# Patient Record
Sex: Female | Born: 1995 | Hispanic: No | Marital: Single | Smoking: Former smoker
Health system: Southern US, Community
[De-identification: ages and names within clinical notes are randomized; demographics above are authoritative.]

## PROBLEM LIST (undated history)

## (undated) ENCOUNTER — Inpatient Hospital Stay (HOSPITAL_COMMUNITY): Payer: Self-pay

## (undated) DIAGNOSIS — R112 Nausea with vomiting, unspecified: Secondary | ICD-10-CM

## (undated) DIAGNOSIS — M1990 Unspecified osteoarthritis, unspecified site: Secondary | ICD-10-CM

## (undated) DIAGNOSIS — Z9889 Other specified postprocedural states: Secondary | ICD-10-CM

## (undated) DIAGNOSIS — S143XXA Injury of brachial plexus, initial encounter: Secondary | ICD-10-CM

## (undated) DIAGNOSIS — M419 Scoliosis, unspecified: Secondary | ICD-10-CM

## (undated) DIAGNOSIS — J45909 Unspecified asthma, uncomplicated: Secondary | ICD-10-CM

## (undated) HISTORY — DX: Unspecified osteoarthritis, unspecified site: M19.90

## (undated) HISTORY — DX: Injury of brachial plexus, initial encounter: S14.3XXA

## (undated) HISTORY — PX: NECK SURGERY: SHX720

## (undated) HISTORY — PX: LEG SURGERY: SHX1003

## (undated) HISTORY — PX: WISDOM TOOTH EXTRACTION: SHX21

## (undated) HISTORY — PX: SPINE SURGERY: SHX786

## (undated) HISTORY — PX: OTHER SURGICAL HISTORY: SHX169

---

## 2015-09-04 ENCOUNTER — Emergency Department (HOSPITAL_COMMUNITY): Payer: No Typology Code available for payment source

## 2015-09-04 ENCOUNTER — Encounter (HOSPITAL_COMMUNITY): Payer: Self-pay | Admitting: *Deleted

## 2015-09-04 ENCOUNTER — Emergency Department (HOSPITAL_COMMUNITY)
Admission: EM | Admit: 2015-09-04 | Discharge: 2015-09-04 | Disposition: A | Discharge status: Home / Self Care | Payer: No Typology Code available for payment source | Attending: Emergency Medicine | Admitting: Emergency Medicine

## 2015-09-04 DIAGNOSIS — S40012A Contusion of left shoulder, initial encounter: Secondary | ICD-10-CM | POA: Diagnosis not present

## 2015-09-04 DIAGNOSIS — Y92410 Unspecified street and highway as the place of occurrence of the external cause: Secondary | ICD-10-CM | POA: Diagnosis not present

## 2015-09-04 DIAGNOSIS — J45909 Unspecified asthma, uncomplicated: Secondary | ICD-10-CM | POA: Diagnosis not present

## 2015-09-04 DIAGNOSIS — Y998 Other external cause status: Secondary | ICD-10-CM | POA: Insufficient documentation

## 2015-09-04 DIAGNOSIS — S161XXA Strain of muscle, fascia and tendon at neck level, initial encounter: Secondary | ICD-10-CM | POA: Diagnosis not present

## 2015-09-04 DIAGNOSIS — S0990XA Unspecified injury of head, initial encounter: Secondary | ICD-10-CM | POA: Insufficient documentation

## 2015-09-04 DIAGNOSIS — S199XXA Unspecified injury of neck, initial encounter: Secondary | ICD-10-CM | POA: Diagnosis present

## 2015-09-04 DIAGNOSIS — Y9389 Activity, other specified: Secondary | ICD-10-CM | POA: Diagnosis not present

## 2015-09-04 DIAGNOSIS — S3992XA Unspecified injury of lower back, initial encounter: Secondary | ICD-10-CM | POA: Diagnosis not present

## 2015-09-04 DIAGNOSIS — Z8739 Personal history of other diseases of the musculoskeletal system and connective tissue: Secondary | ICD-10-CM | POA: Insufficient documentation

## 2015-09-04 HISTORY — DX: Unspecified asthma, uncomplicated: J45.909

## 2015-09-04 HISTORY — DX: Scoliosis, unspecified: M41.9

## 2015-09-04 MED ORDER — METHOCARBAMOL 500 MG PO TABS: 500.0000 mg | ORAL_TABLET | Freq: Two times a day (BID) | ORAL | Status: DC

## 2015-09-04 MED ORDER — IBUPROFEN 400 MG PO TABS
400.0000 mg | ORAL_TABLET | Freq: Once | ORAL | Status: AC
  Administered 2015-09-04: 400 mg via ORAL
  Filled 2015-09-04: qty 1

## 2015-09-04 MED ORDER — NAPROXEN 375 MG PO TABS: 375.0000 mg | ORAL_TABLET | Freq: Two times a day (BID) | ORAL | Status: DC

## 2015-09-04 NOTE — Discharge Instructions (Signed)
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
## 2015-09-04 NOTE — ED Notes (Signed)
Patient verbalized understanding of discharge instructions and denies any further needs or questions at this time. VS stable. Patient ambulatory with steady gait.  
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
## 2016-03-28 ENCOUNTER — Encounter (HOSPITAL_COMMUNITY): Payer: Self-pay | Admitting: *Deleted

## 2016-03-28 ENCOUNTER — Emergency Department (HOSPITAL_COMMUNITY): Payer: Self-pay

## 2016-03-28 ENCOUNTER — Emergency Department (HOSPITAL_COMMUNITY)
Admission: EM | Admit: 2016-03-28 | Discharge: 2016-03-28 | Disposition: A | Discharge status: Home / Self Care | Payer: Self-pay | Attending: Emergency Medicine | Admitting: Emergency Medicine

## 2016-03-28 DIAGNOSIS — M25562 Pain in left knee: Secondary | ICD-10-CM | POA: Insufficient documentation

## 2016-03-28 DIAGNOSIS — Y929 Unspecified place or not applicable: Secondary | ICD-10-CM | POA: Insufficient documentation

## 2016-03-28 DIAGNOSIS — F17200 Nicotine dependence, unspecified, uncomplicated: Secondary | ICD-10-CM | POA: Insufficient documentation

## 2016-03-28 DIAGNOSIS — Y9389 Activity, other specified: Secondary | ICD-10-CM | POA: Insufficient documentation

## 2016-03-28 DIAGNOSIS — J45909 Unspecified asthma, uncomplicated: Secondary | ICD-10-CM | POA: Insufficient documentation

## 2016-03-28 DIAGNOSIS — Y999 Unspecified external cause status: Secondary | ICD-10-CM | POA: Insufficient documentation

## 2016-03-28 DIAGNOSIS — W2203XA Walked into furniture, initial encounter: Secondary | ICD-10-CM | POA: Insufficient documentation

## 2016-03-28 MED ORDER — NAPROXEN 500 MG PO TABS: 500.0000 mg | ORAL_TABLET | Freq: Two times a day (BID) | ORAL | 0 refills | Status: DC

## 2016-03-28 MED ORDER — HYDROCODONE-ACETAMINOPHEN 5-325 MG PO TABS: 1.0000 | ORAL_TABLET | ORAL | 0 refills | Status: DC | PRN

## 2016-03-28 MED ORDER — HYDROCODONE-ACETAMINOPHEN 5-325 MG PO TABS
1.0000 | ORAL_TABLET | Freq: Once | ORAL | Status: AC
  Administered 2016-03-28: 1 via ORAL
  Filled 2016-03-28: qty 1

## 2016-03-28 NOTE — Progress Notes (Signed)
Orthopedic Tech Progress Note Patient Details:  Linda Berry 07/10/1995 [id number]  Ortho Devices Type of Ortho Device: Crutches Ortho Device/Splint Interventions: Ordered, Adjustment   Jennye MoccasinHughes, Shaneese Tait Craig 03/28/2016, 5:54 PM

## 2016-03-28 NOTE — ED Provider Notes (Signed)
[facility]-EMERGENCY DEPT Provider Note   CSN: [id number] Arrival date & time: 03/28/16  1647  By signing my name below, I, Linda Berry, attest that this documentation has been prepared under the direction and in the presence of Rayhan Groleau, [place]. Electronically Signed: Valentino SaxonBianca Berry, ED Scribe. 03/28/16. 5:12 PM.  Chief Complaint Chief Complaint  Patient presents with  . Knee Pain   The history is provided by the patient. No language interpreter was used.   HPI Comments: Linda Berry is a 20 y.o. female who presents to the Emergency Department complaining of moderate, constant, left knee pain s/p injury that occurred last night. Pt reports she was playing drinking games with her friends and states she got up to use the restroom but striked her knee on a sharp corner table nearby. She notes the sudden pain was unbearable and caused her to fall and hit her knee again on the side. She denies any additional injuries. Pt states she was able to ambulate with difficulty afterwards. No modifying factors noted. Pt notes ice-pack given in ED has had significant relief with swelling and pain. Pt notes she is unable to ambulate at this time due to pain. She also notes tenderness to the affected area. She denies fever. Denies hitting her head or LOC.  Past Medical History:  Diagnosis Date  . Asthma   . Scoliosis     There are no active problems to display for this patient.   Past Surgical History:  Procedure Laterality Date  . arm surgery     brachial plexus    OB History    No data available       Home Medications    Prior to Admission medications   Medication Sig Start Date End Date Taking? Authorizing Provider  methocarbamol (ROBAXIN) 500 MG tablet Take 1 tablet (500 mg total) by mouth 2 (two) times daily. 09/04/15   Hope Orlene OchM Neese, NP  naproxen (NAPROSYN) 375 MG tablet Take 1 tablet (375 mg total) by mouth 2 (two) times daily. 09/04/15   Hope Orlene OchM Neese, NP    Family History No  family history on file.  Social History Social History  Substance Use Topics  . Smoking status: Current Some Day Smoker  . Smokeless tobacco: Never Used  . Alcohol use No     Allergies   Patient has no known allergies.   Review of Systems Review of Systems  Constitutional: Negative for fever.  Musculoskeletal: Positive for arthralgias (left knee) and joint swelling.  All other systems reviewed and are negative.    Physical Exam Updated Vital Signs BP 136/80 (BP Location: Right Arm)   Pulse 90   Temp 98.7 F (37.1 C) (Oral)   Resp 16   Ht 4' 11.5" (1.511 m)   Wt 140 lb (63.5 kg)   LMP 03/10/2016   SpO2 100%   BMI 27.80 kg/m   Physical Exam  Constitutional: She appears well-developed and well-nourished.  HENT:  Head: Normocephalic and atraumatic.  Eyes: Conjunctivae are normal. Right eye exhibits no discharge. Left eye exhibits no discharge.  Pulmonary/Chest: Effort normal. No respiratory distress.  Musculoskeletal:  Left knee diffusely edematous especially suprapatellar. Diffuse TTP esp anterior aspect. Limited active ROM 2/2 pain. Full passive ROM. +pain with valgus and varus stress. Negative anterior drawer test. 2+ DP and PT Sensation intact throughout  Neurological: She is alert. Coordination normal.  Skin: Skin is warm and dry. No rash noted. She is not diaphoretic. No erythema.  Psychiatric: She has  a normal mood and affect.  Nursing note and vitals reviewed.    ED Treatments / Results   DIAGNOSTIC STUDIES: Oxygen Saturation is 100% on RA, normal by my interpretation.    COORDINATION OF CARE: 5:05 PM Discussed treatment plan with pt at bedside which includes a knee XR and f/u with knee orthopedist and pt agreed to plan.   Labs (all labs ordered are listed, but only abnormal results are displayed) Labs Reviewed - No data to display  EKG  EKG Interpretation None       Radiology Dg Knee Complete 4 Views Left  Result Date:  03/28/2016 CLINICAL DATA:  Fall.  Initial evaluation . EXAM: LEFT KNEE - COMPLETE 4+ VIEW COMPARISON:  No recent prior. FINDINGS: Prominent knee joint effusion. No acute bony or joint abnormality identified. IMPRESSION: Prominent knee joint effusion.  No acute abnormality. Electronically Signed   By: Maisie Fushomas  Register   On: 03/28/2016 17:32    Procedures Procedures (including critical care time)  Medications Ordered in ED Medications  HYDROcodone-acetaminophen (NORCO/VICODIN) 5-325 MG per tablet 1 tablet (not administered)     Initial Impression / Assessment and Plan / ED Course  I have reviewed the triage vital signs and the nursing notes.  Pertinent labs & imaging results that were available during my care of the patient were reviewed by me and considered in my medical decision making (see chart for details).  Clinical Course    X-ray with effusion but no acute bony abnormality. Discussed possibility of strain or other ligamentous/soft tissue injury vs contusion. Will provide knee sleeve and crutches. RICE therapy encouraged. Instructed ortho f/u. ER return precautions given.  Final Clinical Impressions(s) / ED Diagnoses   Final diagnoses:  Acute pain of left knee    New Prescriptions Discharge Medication List as of 03/28/2016  5:46 PM    START taking these medications   Details  HYDROcodone-acetaminophen (NORCO/VICODIN) 5-325 MG tablet Take 1 tablet by mouth every 4 (four) hours as needed., Starting Thu 03/28/2016, Print    !! naproxen (NAPROSYN) 500 MG tablet Take 1 tablet (500 mg total) by mouth 2 (two) times daily., Starting Thu 03/28/2016, Print     !! - Potential duplicate medications found. Please discuss with provider.      I personally performed the services described in this documentation, which was scribed in my presence. The recorded information has been reviewed and is accurate.    Carlene CoriaSerena Y Modupe Shampine, PA-C 03/28/16 2033    Lorre NickAnthony Allen, MD 03/29/16 [phone]

## 2016-03-28 NOTE — ED Triage Notes (Signed)
Pt states was playing drinking uno last night and ran into a bedside table. Her L knee hit the corner and then she landed on L knee on floor. Was initially able to ambulate, but now pain to great.  Swelling noted.

## 2016-03-28 NOTE — Discharge Instructions (Signed)
You were seen in the emergency room today for evaluation of left knee injury. Your x-ray showed some swelling but otherwise negative. As we discussed you might have strained or sprained something. Take medication as prescribed as needed. Ice your knee on and off for the next 24-48 hrs. Keep your leg elevated when resting at home. Follow up with orthopedics next week as needed. Return to the emergency room for new or worsening symptoms.

## 2016-03-28 NOTE — ED Notes (Signed)
Ice pack applied to left knee

## 2018-04-29 NOTE — L&D Delivery Note (Signed)
LABOR COURSE Patient was admitted for IOL for IUGR in the setting of di-di twin gestation. Cytotec was used for cervical ripening as well as a cook catheter. She was augmented with Pitocin. Baby A's placenta was AROM'd at 1100. Baby B's placenta was AROM'd just prior to delivery.  Delivery Note Called to room and patient was complete, pushing well throughout second stage.    Baby A delivered MOA. No nuchal cord present. Shoulder and body delivered in usual fashion. At 1720 a viable female was delivered via Vaginal, Spontaneous (Presentation:MOA, no restitution).  Infant with spontaneous cry, placed on mother's abdomen, dried and stimulated. Cord clamped x 2 after one-minute delay, and cut by FOB. Cord blood drawn.   Baby B delivered LOT with restitution to LOA. No nuchal cord present. Shoulder and body delivered in usual fashion. At 1741 a viable female was delivered via Vaginal, Spontaneous (Presentation:LOT, LOA.Infant with spontaneous cry, placed on mother's abdomen, dried and stimulated. Cord clamped x 2 after one-minute delay, and cut by FOB. Cord blood drawn.   Placentas delivered together spontaneously with gentle cord traction. Appear intact. Fundus slowly firm with massage and Pitocin. Labia, perineum, vagina, and cervix inspected with no lacerations noted. Called by RN for brisk bleeding approximately one hour following delivery in setting of firm fundus at 1 below U. 800 mcg Cytotec placed per rectum by RN. Called a second time just prior to patient's move to Postpartum unit for new onset slow trickle of bleeding and passing of one small clot. Small clots evacuated with firm fundal massage. Additional 2-3 large clots evacuated manually. Tolerate very well by patient.  Baby A APGAR:8 , 9; weight: 2340g .   Cord: 3VC with the following complications:None.   Cord pH: Not collected  Baby B APGAR:8 , 9; weight: 2020g .   Cord: 3VC with the following complications:None.   Cord pH: Not  collected  Anesthesia:  Epidural Episiotomy: None Lacerations: None Est. Blood Loss (mL): 1100  Mom to postpartum.  Infants to Couplet care / Skin to Skin. Placentas to Pathology  Follow up Visit:  Please schedule this patient for Postpartum visit in: 4 weeks with the following provider: Any provider For C/S patients schedule nurse incision check in weeks 2 weeks: no High risk pregnancy complicated by: IUGR, PRe-E without severe features Delivery mode: SVD Anticipated Birth Control: Declines PP Procedures needed: BP check  Schedule Integrated Kenneth visit: no  Mallie Snooks, CNM 12/05/18 8:30 PM

## 2018-05-09 ENCOUNTER — Inpatient Hospital Stay (HOSPITAL_COMMUNITY)
Admission: AD | Admit: 2018-05-09 | Discharge: 2018-05-09 | Disposition: A | Discharge status: Home / Self Care | Payer: Medicaid Other | Attending: Family Medicine | Admitting: Family Medicine

## 2018-05-09 ENCOUNTER — Encounter (HOSPITAL_COMMUNITY): Payer: Self-pay | Admitting: *Deleted

## 2018-05-09 ENCOUNTER — Inpatient Hospital Stay (HOSPITAL_COMMUNITY): Payer: Medicaid Other

## 2018-05-09 ENCOUNTER — Other Ambulatory Visit: Payer: Self-pay

## 2018-05-09 DIAGNOSIS — R109 Unspecified abdominal pain: Secondary | ICD-10-CM

## 2018-05-09 DIAGNOSIS — O99331 Smoking (tobacco) complicating pregnancy, first trimester: Secondary | ICD-10-CM | POA: Insufficient documentation

## 2018-05-09 DIAGNOSIS — O30041 Twin pregnancy, dichorionic/diamniotic, first trimester: Secondary | ICD-10-CM | POA: Diagnosis not present

## 2018-05-09 DIAGNOSIS — O219 Vomiting of pregnancy, unspecified: Secondary | ICD-10-CM

## 2018-05-09 DIAGNOSIS — O98311 Other infections with a predominantly sexual mode of transmission complicating pregnancy, first trimester: Secondary | ICD-10-CM | POA: Diagnosis not present

## 2018-05-09 DIAGNOSIS — O23591 Infection of other part of genital tract in pregnancy, first trimester: Secondary | ICD-10-CM

## 2018-05-09 DIAGNOSIS — A5901 Trichomonal vulvovaginitis: Secondary | ICD-10-CM | POA: Diagnosis not present

## 2018-05-09 DIAGNOSIS — O210 Mild hyperemesis gravidarum: Secondary | ICD-10-CM | POA: Insufficient documentation

## 2018-05-09 DIAGNOSIS — Z3A01 Less than 8 weeks gestation of pregnancy: Secondary | ICD-10-CM | POA: Diagnosis not present

## 2018-05-09 DIAGNOSIS — O26891 Other specified pregnancy related conditions, first trimester: Secondary | ICD-10-CM | POA: Diagnosis not present

## 2018-05-09 DIAGNOSIS — F17200 Nicotine dependence, unspecified, uncomplicated: Secondary | ICD-10-CM | POA: Diagnosis not present

## 2018-05-09 LAB — URINALYSIS, ROUTINE W REFLEX MICROSCOPIC
BILIRUBIN URINE: NEGATIVE
Glucose, UA: NEGATIVE mg/dL
Hgb urine dipstick: NEGATIVE
KETONES UR: NEGATIVE mg/dL
Leukocytes, UA: NEGATIVE
NITRITE: NEGATIVE
PH: 7 (ref 5.0–8.0)
Protein, ur: NEGATIVE mg/dL
Specific Gravity, Urine: 1.02 (ref 1.005–1.030)

## 2018-05-09 LAB — CBC
HEMATOCRIT: 35.9 % — AB (ref 36.0–46.0)
Hemoglobin: 12.5 g/dL (ref 12.0–15.0)
MCH: 31.8 pg (ref 26.0–34.0)
MCHC: 34.8 g/dL (ref 30.0–36.0)
MCV: 91.3 fL (ref 80.0–100.0)
Platelets: 211 10*3/uL (ref 150–400)
RBC: 3.93 MIL/uL (ref 3.87–5.11)
RDW: 12.6 % (ref 11.5–15.5)
WBC: 12.7 10*3/uL — AB (ref 4.0–10.5)
nRBC: 0 % (ref 0.0–0.2)

## 2018-05-09 LAB — WET PREP, GENITAL
Sperm: NONE SEEN
Yeast Wet Prep HPF POC: NONE SEEN

## 2018-05-09 LAB — ABO/RH: ABO/RH(D): O POS

## 2018-05-09 LAB — HCG, QUANTITATIVE, PREGNANCY: hCG, Beta Chain, Quant, S: 16562 m[IU]/mL — ABNORMAL HIGH (ref <5)

## 2018-05-09 LAB — POCT PREGNANCY, URINE: Preg Test, Ur: POSITIVE — AB

## 2018-05-09 MED ORDER — METRONIDAZOLE 500 MG PO TABS
2000.0000 mg | ORAL_TABLET | Freq: Once | ORAL | Status: AC
  Administered 2018-05-09: 2000 mg via ORAL
  Filled 2018-05-09: qty 4

## 2018-05-09 MED ORDER — PROMETHAZINE HCL 25 MG PO TABS: 25.0000 mg | ORAL_TABLET | Freq: Four times a day (QID) | ORAL | 0 refills | Status: DC | PRN

## 2018-05-09 NOTE — MAU Note (Signed)
Nausea x1wk.  Period late.  +HPT today.  occ period like cramps, very brief.

## 2018-05-09 NOTE — MAU Provider Note (Signed)
Chief Complaint: Nausea and Possible Pregnancy   First Provider Initiated Contact with Patient 05/09/18 1138     SUBJECTIVE HPI: Linda Berry is a 23 y.o. G1P0 at 5332w1d by LMP who presents to MAU reporting abdominal cramping & nausea. Had a positive pregnancy test at home today.  Has been very nauseated for the last week. No vomiting. Hasn't treated symptoms.  Reports intermittent lower abdominal cramping.  Denies dysuria, vaginal bleeding, or vaginal discharge.   Location: lower abdomen Quality: cramping Severity: 4/10 on pain scale Duration: 1 week Timing: intermittent Modifying factors: none Associated signs and symptoms: nausea  Past Medical History:  Diagnosis Date  . Asthma   . Scoliosis    OB History  Gravida Para Term Preterm AB Living  1            SAB TAB Ectopic Multiple Live Births               # Outcome Date GA Lbr Len/2nd Weight Sex Delivery Anes PTL Lv  1 Current            Past Surgical History:  Procedure Laterality Date  . arm surgery     brachial plexus   Social History   Socioeconomic History  . Marital status: Single    Spouse name: Not on file  . Number of children: Not on file  . Years of education: Not on file  . Highest education level: Not on file  Occupational History  . Not on file  Social Needs  . Financial resource strain: Not on file  . Food insecurity:    Worry: Not on file    Inability: Not on file  . Transportation needs:    Medical: Not on file    Non-medical: Not on file  Tobacco Use  . Smoking status: Current Some Day Smoker  . Smokeless tobacco: Never Used  Substance and Sexual Activity  . Alcohol use: No  . Drug use: No  . Sexual activity: Yes    Birth control/protection: None  Lifestyle  . Physical activity:    Days per week: Not on file    Minutes per session: Not on file  . Stress: Not on file  Relationships  . Social connections:    Talks on phone: Not on file    Gets together: Not on file   Attends religious service: Not on file    Active member of club or organization: Not on file    Attends meetings of clubs or organizations: Not on file    Relationship status: Not on file  . Intimate partner violence:    Fear of current or ex partner: Not on file    Emotionally abused: Not on file    Physically abused: Not on file    Forced sexual activity: Not on file  Other Topics Concern  . Not on file  Social History Narrative  . Not on file   History reviewed. No pertinent family history. No current facility-administered medications on file prior to encounter.    Current Outpatient Medications on File Prior to Encounter  Medication Sig Dispense Refill  . Menthol, Topical Analgesic, (BIOFREEZE EX) Apply 1 application topically daily as needed. For back pain     No Known Allergies  I have reviewed patient's Past Medical Hx, Surgical Hx, Family Hx, Social Hx, medications and allergies.   Review of Systems  Constitutional: Negative.   Gastrointestinal: Positive for abdominal pain and nausea. Negative for constipation, diarrhea and vomiting.  Genitourinary: Negative.     OBJECTIVE Patient Vitals for the past 24 hrs:  BP Temp Temp src Pulse Resp SpO2  05/09/18 1428 115/65 - - 66 - -  05/09/18 1139 (!) 112/59 98.5 F (36.9 C) Oral 71 16 100 %   Constitutional: Well-developed, well-nourished female in no acute distress.  Cardiovascular: normal rate & rhythm, no murmur Respiratory: normal rate and effort. Lung sounds clear throughout GI: Abd soft, non-tender, Pos BS x 4. No guarding or rebound tenderness MS: Extremities nontender, no edema, normal ROM Neurologic: Alert and oriented x 4.     LAB RESULTS Results for orders placed or performed during the hospital encounter of 05/09/18 (from the past 24 hour(s))  Urinalysis, Routine w reflex microscopic     Status: None   Collection Time: 05/09/18 11:12 AM  Result Value Ref Range   Color, Urine YELLOW YELLOW   APPearance  CLEAR CLEAR   Specific Gravity, Urine 1.020 1.005 - 1.030   pH 7.0 5.0 - 8.0   Glucose, UA NEGATIVE NEGATIVE mg/dL   Hgb urine dipstick NEGATIVE NEGATIVE   Bilirubin Urine NEGATIVE NEGATIVE   Ketones, ur NEGATIVE NEGATIVE mg/dL   Protein, ur NEGATIVE NEGATIVE mg/dL   Nitrite NEGATIVE NEGATIVE   Leukocytes, UA NEGATIVE NEGATIVE  Pregnancy, urine POC     Status: Abnormal   Collection Time: 05/09/18 11:13 AM  Result Value Ref Range   Preg Test, Ur POSITIVE (A) NEGATIVE  Wet prep, genital     Status: Abnormal   Collection Time: 05/09/18 11:53 AM  Result Value Ref Range   Yeast Wet Prep HPF POC NONE SEEN NONE SEEN   Trich, Wet Prep PRESENT (A) NONE SEEN   Clue Cells Wet Prep HPF POC PRESENT (A) NONE SEEN   WBC, Wet Prep HPF POC FEW (A) NONE SEEN   Sperm NONE SEEN   CBC     Status: Abnormal   Collection Time: 05/09/18 12:24 PM  Result Value Ref Range   WBC 12.7 (H) 4.0 - 10.5 K/uL   RBC 3.93 3.87 - 5.11 MIL/uL   Hemoglobin 12.5 12.0 - 15.0 g/dL   HCT 16.1 (L) 09.6 - 04.5 %   MCV 91.3 80.0 - 100.0 fL   MCH 31.8 26.0 - 34.0 pg   MCHC 34.8 30.0 - 36.0 g/dL   RDW 40.9 81.1 - 91.4 %   Platelets 211 150 - 400 K/uL   nRBC 0.0 0.0 - 0.2 %  ABO/Rh     Status: None (Preliminary result)   Collection Time: 05/09/18 12:24 PM  Result Value Ref Range   ABO/RH(D)      O POS Performed at [facility], [address] [place], [place] [address]   hCG, quantitative, pregnancy     Status: Abnormal   Collection Time: 05/09/18 12:24 PM  Result Value Ref Range   hCG, Beta Chain, Quant, S 16,562 (H) <5 mIU/mL    IMAGING US Ob Comp Addl Gest Less 14 Wks  Result Date: 05/09/2018 CLINICAL DATA:  Pelvic pain. Gestational age by LMP of 6 weeks 1 day. EXAM: TWIN OBSTETRIC <14WK [place] AND TRANSVAGINAL OB US COMPARISON:  None. FINDINGS: Number of IUPs:  2 Chorionicity/Amnionicity:  Dichorionic-diamniotic (thick membrane) TWIN 1 Yolk sac:  Visualized. Embryo:  Not Visualized. MSD: 7 mm   5 w   2 d TWIN  2 Yolk sac:  Visualized. Embryo:  Not Visualized. MSD: 9 mm   5 w   5 d Subchorionic hemorrhage:  None visualized. Maternal  uterus/adnexae: Normal appearance of both ovaries. No mass or abnormal free fluid identified. IMPRESSION: Early dichorionic twin IUP at approximately 5.5 weeks. Suggest correlation with serial b-hCG levels, and consider followup ultrasound to assess viability in 10 days. No significant maternal uterine or adnexal abnormality identified. Electronically Signed   By: Myles Rosenthal M.D.   On: 05/09/2018 13:53   US Ob Less Than 14 Weeks With Ob Transvaginal  Result Date: 05/09/2018 CLINICAL DATA:  Pelvic pain. Gestational age by LMP of 6 weeks 1 day. EXAM: TWIN OBSTETRIC <14WK [place] AND TRANSVAGINAL OB US COMPARISON:  None. FINDINGS: Number of IUPs:  2 Chorionicity/Amnionicity:  Dichorionic-diamniotic (thick membrane) TWIN 1 Yolk sac:  Visualized. Embryo:  Not Visualized. MSD: 7 mm   5 w   2 d TWIN 2 Yolk sac:  Visualized. Embryo:  Not Visualized. MSD: 9 mm   5 w   5 d Subchorionic hemorrhage:  None visualized. Maternal uterus/adnexae: Normal appearance of both ovaries. No mass or abnormal free fluid identified. IMPRESSION: Early dichorionic twin IUP at approximately 5.5 weeks. Suggest correlation with serial b-hCG levels, and consider followup ultrasound to assess viability in 10 days. No significant maternal uterine or adnexal abnormality identified. Electronically Signed   By: Myles Rosenthal M.D.   On: 05/09/2018 13:53    MAU COURSE Orders Placed This Encounter  Procedures  . Wet prep, genital  . US OB LESS THAN 14 WEEKS WITH OB TRANSVAGINAL  . US OB Comp AddL Gest Less 14 Wks  . Urinalysis, Routine w reflex microscopic  . CBC  . hCG, quantitative, pregnancy  . Pregnancy, urine POC  . ABO/Rh  . Discharge patient   Meds ordered this encounter  Medications  . metroNIDAZOLE (FLAGYL) tablet 2,000 mg  . promethazine (PHENERGAN) 25 MG tablet    Sig: Take 1 tablet (25 mg total) by mouth  every 6 (six) hours as needed for nausea or vomiting.    Dispense:  30 tablet    Refill:  0    Order Specific Question:   Supervising Provider    Answer:   Reva Bores [[id number]]    MDM +UPT UA, wet prep, GC/chlamydia, CBC, ABO/Rh, quant hCG, and [place] today to rule out ectopic pregnancy Wet prep + trich --- flagyl 2 gm PO given in MAU. EPT rx & info given to partner.   Ultrasound shows IUGS & YS x 2  ASSESSMENT 1. Dichorionic diamniotic twin pregnancy in first trimester   2. Abdominal pain during pregnancy in first trimester   3. Trichomonal vaginitis during pregnancy in first trimester   4. Nausea and vomiting during pregnancy prior to [redacted] weeks gestation     PLAN Discharge home in stable condition Start prenatal care Rx promethazine GC/CT pending No intercourse x 2 weeks after trichomonas tx.   Allergies as of 05/09/2018   No Known Allergies     Medication List    STOP taking these medications   HYDROcodone-acetaminophen 5-325 MG tablet Commonly known as:  NORCO/VICODIN   methocarbamol 500 MG tablet Commonly known as:  ROBAXIN   naproxen 375 MG tablet Commonly known as:  NAPROSYN   naproxen 500 MG tablet Commonly known as:  NAPROSYN     TAKE these medications   BIOFREEZE EX Apply 1 application topically daily as needed. For back pain   promethazine 25 MG tablet Commonly known as:  PHENERGAN Take 1 tablet (25 mg total) by mouth every 6 (six) hours as needed for nausea or vomiting.  Judeth HornLawrence, Memory Heinrichs, NP 05/09/2018  6:33 PM

## 2018-05-09 NOTE — Discharge Instructions (Signed)
[facility]   [facility]       Phone: [phone]  [facility] for [facility] Phone: [phone]  [facility] [place]  Phone: [phone]  [facility] for [facility]  Phone: [phone]  [facility]  Phone: [phone]  [facility]       Phone: [phone]  [facility]    Phone: [phone]   [facility]    Phone: [phone]  [facility]    Phone: [phone]  [facility]    Phone: [phone]   [facility]  Phone: [phone]  [facility]    Phone: [phone]  [facility]   Phone: [phone]  [facility]    Phone: [phone]  Multiple Pregnancy Having a multiple pregnancy means that a woman is carrying more than one baby at a time. She may be pregnant with twins, triplets, or more. The majority of multiple pregnancies are twins. Naturally conceiving triplets or more (higher-order multiples) is rare. Multiple pregnancies are riskier than single pregnancies. A woman with a multiple pregnancy is more likely to have certain problems during her pregnancy. Therefore, she will need to have more frequent appointments for prenatal care. How does a multiple pregnancy happen? A multiple pregnancy happens when:  The woman's body releases more than one egg at a time, and then each egg gets fertilized by a different sperm. ? This is the most common type of multiple pregnancy. ? Twins or other multiples produced this way are fraternal. They are no more alike than non-multiple siblings are.  One sperm fertilizes one egg, which then divides into more than one embryo. ? Twins or other multiples produced this way are  identical. Identical multiples are always the same gender, and they look very much alike. Who is most likely to have a multiple pregnancy? A multiple pregnancy is more likely to develop in women who:  Have had fertility treatment, especially if the treatment included fertility drugs.  Are older than 24 years of age.  Have already had four or more children.  Have a family history of multiple pregnancy. How is a multiple pregnancy diagnosed? A multiple pregnancy may be diagnosed based on:  Symptoms such as: ? Rapid weight gain in the first 3 months of pregnancy (first trimester). ? More severe nausea and breast tenderness than what is typical of a single pregnancy. ? The uterus measuring larger than what is normal for the stage of the pregnancy.  Blood tests that detect a higher-than-normal level of human chorionic gonadotropin (hCG). This is a hormone that your body produces in early pregnancy.  Ultrasound exam. This is used to confirm that you are carrying multiples. What risks are associated with multiple pregnancy? A multiple pregnancy puts you at a higher risk for certain problems during or after your pregnancy, including:  Having your babies delivered before you have reached a full-term pregnancy (preterm birth). A full-term pregnancy lasts for at least 37 weeks. Babies born before 37 weeks may have a higher risk of a variety of health problems, such as breathing problems, feeding difficulties, cerebral palsy, and learning disabilities.  Diabetes.  Preeclampsia. This is a serious condition that causes high blood pressure along with other symptoms, such as swelling and headaches, during pregnancy.  Excessive blood loss after childbirth (postpartum hemorrhage).  Postpartum  depression.  Low birth weight of the babies. How will having a multiple pregnancy affect my care? Your health care provider will want to monitor you more closely during your pregnancy to make sure that your  babies are growing normally and that you are healthy. Follow these instructions at home: Because your pregnancy is considered to be high risk, you will need to work closely with your health care team. You may also need to make some lifestyle changes. These may include the following: Eating and drinking  Increase your nutrition. ? Follow your health care providers recommendations for weight gain. You may need to gain a little extra weight when you are pregnant with multiples. ? Eat healthy snacks often throughout the day. This can add calories and reduce nausea.  Drink enough fluid to keep your urine pale yellow.  Take prenatal vitamins. Activity By 20-24 weeks, you may need to limit your activities.  Avoid activities and work that take a lot of effort (are strenuous).  Ask your health care provider when you should stop having sexual intercourse.  Rest often. General instructions  Do not use any products that contain nicotine or tobacco, such as cigarettes and e-cigarettes. If you need help quitting, ask your health care provider.  Do not drink alcohol or use illegal drugs.  Take over-the-counter and prescription medicines only as told by your health care provider.  Arrange for extra help around the house.  Keep all follow-up visits and all prenatal visits as told by your health care provider. This is important. Contact a health care provider if:  You have dizziness.  You have persistent nausea, vomiting, or diarrhea.  You are having trouble gaining weight.  You have feelings of depression or other emotions that are interfering with your normal activities. Get help right away if:  You have a fever.  You have pain with urination.  You have fluid leaking from your vagina.  You have a bad-smelling vaginal discharge.  You notice increased swelling in your face, hands, legs, or ankles.  You have spotting or bleeding from your vagina.  You have pelvic cramps, pelvic  pressure, or nagging pain in your abdomen or lower back.  You are having regular contractions.  You develop a severe headache, with or without visual changes.  You have shortness of breath or chest pain.  You notice less fetal movement, or no fetal movement. Summary  Having a multiple pregnancy means that a woman is carrying more than one baby at a time.  A multiple pregnancy puts you at a higher risk for certain problems during and after your pregnancy, such as: having your babies delivered before you have reached a full-term pregnancy (preterm birth), diabetes, preeclampsia, excessive blood loss after childbirth (postpartum hemorrhage), postpartum depression, or low birth weight of the babies.  Your health care provider will want to monitor you more closely during your pregnancy to make sure that your babies are growing normally and that you are healthy.  You may need to make some lifestyle changes during pregnancy, including: increasing your nutrition, limiting your activities after 20-24 weeks of pregnancy, and arranging for extra help around the house. This information is not intended to replace advice given to you by your health care provider. Make sure you discuss any questions you have with your health care provider. Document Released: 01/23/2008 Document Revised: 01/08/2017 Document Reviewed: 12/15/2015 Elsevier Interactive Patient Education  2019 [employer].  Trichomoniasis Trichomoniasis is an STI (sexually transmitted infection) that can affect both women  and men. In women, the outer area of the female genitalia (vulva) and the vagina are affected. In men, the penis is mainly affected, but the prostate and other reproductive organs can also be involved. This condition can be treated with medicine. It often has no symptoms (is asymptomatic), especially in men. What are the causes? This condition is caused by an organism called Trichomonas vaginalis. Trichomoniasis most often  spreads from person to person (is contagious) through sexual contact. What increases the risk? The following factors may make you more likely to develop this condition:  Having unprotected sexual intercourse.  Having sexual intercourse with a partner who has trichomoniasis.  Having multiple sexual partners.  Having had previous trichomoniasis infections or other STIs. What are the signs or symptoms? In women, symptoms of trichomoniasis include:  Abnormal vaginal discharge that is clear, white, gray, or yellow-green and foamy and has an unusual "fishy" odor.  Itching and irritation of the vagina and vulva.  Burning or pain during urination or sexual intercourse.  Genital redness and swelling. In men, symptoms of trichomoniasis include:  Penile discharge that may be foamy or contain pus.  Pain in the penis. This may happen only when urinating.  Itching or irritation inside the penis.  Burning after urination or ejaculation. How is this diagnosed? In women, this condition may be found during a routine Pap test or physical exam. It may be found in men during a routine physical exam. Your health care provider may perform tests to help diagnose this infection, such as:  Urine tests (men and women).  The following in women: ? Testing the pH of the vagina. ? A vaginal swab test that checks for the Trichomonas vaginalis organism. ? Testing vaginal secretions. Your health care provider may test you for other STIs, including HIV (human immunodeficiency virus). How is this treated? This condition is treated with medicine taken by mouth (orally), such as metronidazole or tinidazole to fight the infection. Your sexual partner(s) may also need to be tested and treated.  If you are a woman and you plan to become pregnant or think you may be pregnant, tell your health care provider right away. Some medicines that are used to treat the infection should not be taken during pregnancy. Your  health care provider may recommend over-the-counter medicines or creams to help relieve itching or irritation. You may be tested for infection again 3 months after treatment. Follow these instructions at home:  Take and use over-the-counter and prescription medicines, including creams, only as told by your health care provider.  Do not have sexual intercourse until one week after you finish your medicine, or until your health care provider approves. Ask your health care provider when you may resume sexual intercourse.  (Women) Do not douche or wear tampons while you have the infection.  Discuss your infection with your sexual partner(s). Make sure that your partner gets tested and treated, if necessary.  Keep all follow-up visits as told by your health care provider. This is important. How is this prevented?  Use condoms every time you have sex. Using condoms correctly and consistently can help protect against STIs.  Avoid having multiple sexual partners.  Talk with your sexual partner about any symptoms that either of you may have, as well as any history of STIs.  Get tested for STIs and STDs (sexually transmitted diseases) before you have sex. Ask your partner to do the same.  Do not have sexual contact if you have symptoms of trichomoniasis or  another STI. Contact a health care provider if:  You still have symptoms after you finish your medicine.  You develop pain in your abdomen.  You have pain when you urinate.  You have bleeding after sexual intercourse.  You develop a rash.  You feel nauseous or you vomit.  You plan to become pregnant or think you may be pregnant. Summary  Trichomoniasis is an STI (sexually transmitted infection) that can affect both women and men.  This condition often has no symptoms (is asymptomatic), especially in men.  You should not have sexual intercourse until one week after you finish your medicine, or until your health care provider  approves. Ask your health care provider when you may resume sexual intercourse.  Discuss your infection with your sexual partner. Make sure that your partner gets tested and treated, if necessary. This information is not intended to replace advice given to you by your health care provider. Make sure you discuss any questions you have with your health care provider. Document Released: 10/09/2000 Document Revised: 03/08/2016 Document Reviewed: 03/08/2016 Elsevier Interactive Patient Education  2019 [employer].

## 2018-05-11 LAB — GC/CHLAMYDIA PROBE AMP (~~LOC~~) NOT AT ARMC
CHLAMYDIA, DNA PROBE: NEGATIVE
NEISSERIA GONORRHEA: NEGATIVE

## 2018-06-23 ENCOUNTER — Encounter: Payer: Self-pay | Admitting: *Deleted

## 2018-06-23 ENCOUNTER — Ambulatory Visit (INDEPENDENT_AMBULATORY_CARE_PROVIDER_SITE_OTHER): Payer: Medicaid Other | Admitting: *Deleted

## 2018-06-23 ENCOUNTER — Other Ambulatory Visit (HOSPITAL_COMMUNITY)
Admission: RE | Admit: 2018-06-23 | Discharge: 2018-06-23 | Disposition: A | Discharge status: Home / Self Care | Payer: Medicaid Other | Source: Ambulatory Visit | Attending: Obstetrics & Gynecology | Admitting: Obstetrics & Gynecology

## 2018-06-23 ENCOUNTER — Other Ambulatory Visit: Payer: Self-pay

## 2018-06-23 VITALS — BP 122/63 | HR 98 | Ht 60.0 in | Wt 140.7 lb

## 2018-06-23 DIAGNOSIS — O99519 Diseases of the respiratory system complicating pregnancy, unspecified trimester: Secondary | ICD-10-CM

## 2018-06-23 DIAGNOSIS — O0990 Supervision of high risk pregnancy, unspecified, unspecified trimester: Secondary | ICD-10-CM

## 2018-06-23 DIAGNOSIS — O0991 Supervision of high risk pregnancy, unspecified, first trimester: Secondary | ICD-10-CM

## 2018-06-23 DIAGNOSIS — Z3A01 Less than 8 weeks gestation of pregnancy: Secondary | ICD-10-CM

## 2018-06-23 DIAGNOSIS — O30049 Twin pregnancy, dichorionic/diamniotic, unspecified trimester: Secondary | ICD-10-CM | POA: Insufficient documentation

## 2018-06-23 DIAGNOSIS — Z3A00 Weeks of gestation of pregnancy not specified: Secondary | ICD-10-CM | POA: Diagnosis not present

## 2018-06-23 DIAGNOSIS — O30041 Twin pregnancy, dichorionic/diamniotic, first trimester: Secondary | ICD-10-CM | POA: Diagnosis not present

## 2018-06-23 DIAGNOSIS — Z113 Encounter for screening for infections with a predominantly sexual mode of transmission: Secondary | ICD-10-CM

## 2018-06-23 DIAGNOSIS — O99511 Diseases of the respiratory system complicating pregnancy, first trimester: Secondary | ICD-10-CM | POA: Diagnosis not present

## 2018-06-23 DIAGNOSIS — J45909 Unspecified asthma, uncomplicated: Secondary | ICD-10-CM

## 2018-06-23 DIAGNOSIS — M419 Scoliosis, unspecified: Secondary | ICD-10-CM | POA: Insufficient documentation

## 2018-06-23 LAB — POCT URINALYSIS DIP (DEVICE)
BILIRUBIN URINE: NEGATIVE
Glucose, UA: NEGATIVE mg/dL
HGB URINE DIPSTICK: NEGATIVE
KETONES UR: NEGATIVE mg/dL
Leukocytes,Ua: NEGATIVE
Nitrite: NEGATIVE
Protein, ur: NEGATIVE mg/dL
Specific Gravity, Urine: 1.02 (ref 1.005–1.030)
Urobilinogen, UA: 0.2 mg/dL (ref 0.0–1.0)
pH: 7 (ref 5.0–8.0)

## 2018-06-23 NOTE — Progress Notes (Signed)
New Ob intake completed and pregnancy information packet given. Labs drawn and Medicaid Home form completed. Informal US performed to verify viability. FHR's obtained per PW doppler. Pt refused flu vaccine. Initial Ob visit scheduled on 07/07/18.

## 2018-06-25 LAB — CULTURE, OB URINE

## 2018-06-25 LAB — GC/CHLAMYDIA PROBE AMP (~~LOC~~) NOT AT ARMC
Chlamydia: NEGATIVE
Neisseria Gonorrhea: NEGATIVE

## 2018-06-25 LAB — URINE CULTURE, OB REFLEX

## 2018-07-01 LAB — OBSTETRIC PANEL, INCLUDING HIV
Antibody Screen: NEGATIVE
Basophils Absolute: 0.1 10*3/uL (ref 0.0–0.2)
Basos: 1 %
EOS (ABSOLUTE): 0.1 10*3/uL (ref 0.0–0.4)
Eos: 0 %
HIV Screen 4th Generation wRfx: NONREACTIVE
Hematocrit: 32.8 % — ABNORMAL LOW (ref 34.0–46.6)
Hemoglobin: 11.5 g/dL (ref 11.1–15.9)
Hepatitis B Surface Ag: NEGATIVE
Immature Grans (Abs): 0.1 10*3/uL (ref 0.0–0.1)
Immature Granulocytes: 1 %
Lymphocytes Absolute: 2.6 10*3/uL (ref 0.7–3.1)
Lymphs: 20 %
MCH: 31.1 pg (ref 26.6–33.0)
MCHC: 35.1 g/dL (ref 31.5–35.7)
MCV: 89 fL (ref 79–97)
MONOCYTES: 7 %
Monocytes Absolute: 0.9 10*3/uL (ref 0.1–0.9)
Neutrophils Absolute: 9.6 10*3/uL — ABNORMAL HIGH (ref 1.4–7.0)
Neutrophils: 71 %
Platelets: 237 10*3/uL (ref 150–450)
RBC: 3.7 x10E6/uL — ABNORMAL LOW (ref 3.77–5.28)
RDW: 12.9 % (ref 11.7–15.4)
RH TYPE: POSITIVE
RPR Ser Ql: NONREACTIVE
Rubella Antibodies, IGG: 12.7 index (ref >0.99)
WBC: 13.3 10*3/uL — ABNORMAL HIGH (ref 3.4–10.8)

## 2018-07-01 LAB — INHERITEST(R) CF/SMA PANEL

## 2018-07-01 LAB — HEMOGLOBINOPATHY EVALUATION
Ferritin: 126 ng/mL (ref 15–150)
HGB F QUANT: 0 % (ref 0.0–2.0)
Hgb A2 Quant: 2.5 % (ref 1.8–3.2)
Hgb A: 97.5 % (ref 96.4–98.8)
Hgb C: 0 %
Hgb S: 0 %
Hgb Solubility: NEGATIVE
Hgb Variant: 0 %

## 2018-07-07 ENCOUNTER — Encounter: Payer: Self-pay | Admitting: Obstetrics & Gynecology

## 2018-07-07 ENCOUNTER — Ambulatory Visit: Payer: Self-pay | Admitting: Clinical

## 2018-07-07 ENCOUNTER — Ambulatory Visit (INDEPENDENT_AMBULATORY_CARE_PROVIDER_SITE_OTHER): Payer: Medicaid Other | Admitting: Obstetrics & Gynecology

## 2018-07-07 ENCOUNTER — Other Ambulatory Visit (HOSPITAL_COMMUNITY)
Admission: RE | Admit: 2018-07-07 | Discharge: 2018-07-07 | Disposition: A | Discharge status: Home / Self Care | Payer: Medicaid Other | Source: Ambulatory Visit | Attending: Obstetrics & Gynecology | Admitting: Obstetrics & Gynecology

## 2018-07-07 VITALS — BP 125/66 | HR 95 | Wt 147.8 lb

## 2018-07-07 DIAGNOSIS — R945 Abnormal results of liver function studies: Secondary | ICD-10-CM

## 2018-07-07 DIAGNOSIS — O0990 Supervision of high risk pregnancy, unspecified, unspecified trimester: Secondary | ICD-10-CM | POA: Diagnosis not present

## 2018-07-07 DIAGNOSIS — O30042 Twin pregnancy, dichorionic/diamniotic, second trimester: Secondary | ICD-10-CM | POA: Diagnosis not present

## 2018-07-07 DIAGNOSIS — Z3A14 14 weeks gestation of pregnancy: Secondary | ICD-10-CM

## 2018-07-07 DIAGNOSIS — O26892 Other specified pregnancy related conditions, second trimester: Secondary | ICD-10-CM

## 2018-07-07 DIAGNOSIS — R7989 Other specified abnormal findings of blood chemistry: Secondary | ICD-10-CM

## 2018-07-07 DIAGNOSIS — R51 Headache: Secondary | ICD-10-CM

## 2018-07-07 DIAGNOSIS — G43009 Migraine without aura, not intractable, without status migrainosus: Secondary | ICD-10-CM

## 2018-07-07 DIAGNOSIS — G43909 Migraine, unspecified, not intractable, without status migrainosus: Secondary | ICD-10-CM | POA: Insufficient documentation

## 2018-07-07 MED ORDER — ASPIRIN 81 MG PO TABS: 81.0000 mg | ORAL_TABLET | Freq: Every day | ORAL | 11 refills | Status: DC

## 2018-07-07 MED ORDER — SUMATRIPTAN SUCCINATE 100 MG PO TABS: 100.0000 mg | ORAL_TABLET | Freq: Once | ORAL | 11 refills | Status: DC | PRN

## 2018-07-07 NOTE — Patient Instructions (Signed)

## 2018-07-07 NOTE — BH Specialist Note (Signed)
Integrated Behavioral Health Initial Visit  MRN: [id number] Name: TALANDA FERRIELL  Number of Integrated Behavioral Health Clinician visits:: 1/6 Session Start time: 11:24  Session End time: 11:38 Total time: 15 minutes  Type of Service: Integrated Behavioral Health- Individual/Family Interpretor:No. Interpretor Name and Language: n/a   Warm Hand Off Completed.       SUBJECTIVE: NITASHA GUIA is a 23 y.o. female accompanied by n/a Patient was referred by Jaynie Collins, MD for Initial OB introduction to integrated behavioral health services . Patient reports the following symptoms/concerns: Pt states mild discomfort affecting sleep, and feeling "a little down"; pt thinks getting outside more would help her feel better.  Duration of problem: Current pregnancy; Severity of problem: mild  OBJECTIVE: Mood: Normal and Affect: Appropriate Risk of harm to self or others: No plan to harm self or others  LIFE CONTEXT: Family and Social: - School/Work: - Self-Care: - Life Changes: Current pregnancy  GOALS ADDRESSED: Patient will: 1. Increase knowledge and/or ability of: healthy habits  2. Demonstrate ability to: Increase healthy adjustment to current life circumstances  INTERVENTIONS: Interventions utilized: Psychoeducation and/or Health Education and Link to [employer]  Standardized Assessments completed: GAD-7 and PHQ 9  ASSESSMENT: Patient currently experiencing Supervision of high risk pregnancy, antepartum.   Patient may benefit from Initial OB introduction to integrated behavioral health services .  PLAN: 1. Follow up with behavioral health clinician on : As needed 2. Behavioral recommendations:  -Continue taking prenatal vitamin, as recommended by medical provider -Share virtual tour of [facility] with support persons at home -Take home [employer] brochure; consider planning routine walks with dogs daily at parks of choice 3. Referral(s):  Integrated [employer] (In Clinic) and Community Resources:  [employer]  [place], [place]  Depression screen [facility] 2/9 07/07/2018 06/23/2018  Decreased Interest 1 0  Down, Depressed, Hopeless 0 0  PHQ - 2 Score 1 0  Altered sleeping 2 1  Tired, decreased energy 1 1  Change in appetite 0 0  Feeling bad or failure about yourself  0 0  Trouble concentrating 0 0  Moving slowly or fidgety/restless 0 0  Suicidal thoughts 0 0  PHQ-9 Score 4 2   GAD 7 : Generalized Anxiety Score 07/07/2018 06/23/2018  Nervous, Anxious, on Edge 0 0  Control/stop worrying 0 0  Worry too much - different things 0 0  Trouble relaxing 0 0  Restless 0 0  Easily annoyed or irritable 0 1  Afraid - awful might happen 0 0  Total GAD 7 Score 0 1

## 2018-07-07 NOTE — Progress Notes (Addendum)
History:   Linda Berry is a 23 y.o. G1P0 at [redacted]w[redacted]d by LMP, early ultrasound being seen today for her first obstetrical visit.  Her obstetrical history is significant for multiple gestation, history of migraines, maternal birth trauma/brachial plexus injury. Patient does intend to breast feed. Pregnancy history fully reviewed.  Patient reports headache, not alleviated by escalating doses of Tylenol. History of migraines prior to pregnancy, was not on any medications. Wants to avoid narcotic medications.     HISTORY: OB History  Gravida Para Term Preterm AB Living  1 0 0 0 0 0  SAB TAB Ectopic Multiple Live Births  0 0 0 0 0    # Outcome Date GA Lbr Len/2nd Weight Sex Delivery Anes PTL Lv  1 Current           Last pap smear was done in 2016 as per patient and was normal.  Past Medical History:  Diagnosis Date  . Asthma   . 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 Scoliosis   . Scoliosis    Past Surgical History:  Procedure Laterality Date  . arm surgery     brachial plexus  . WISDOM TOOTH EXTRACTION     Family History  Problem Relation Age of Onset  . Diabetes Mother   . Hypertension Mother   . Huntington's disease Mother   . Diabetes Father   . Ulcers Father    Social History   Tobacco Use  . Smoking status: Former Smoker    Types: Cigarettes    Last attempt to quit: 05/09/2018    Years since quitting: 0.1  . Smokeless tobacco: Never Used  Substance Use Topics  . Alcohol use: Not Currently  . Drug use: Not Currently    Types: Marijuana    Comment: last use December   No Known Allergies Current Outpatient Medications on File Prior to Visit  Medication Sig Dispense Refill  . Prenatal Vit-Fe Fumarate-FA (MULTIVITAMIN-PRENATAL) 27-0.8 MG TABS tablet Take 1 tablet by mouth daily at 12 noon.    . promethazine (PHENERGAN) 25 MG tablet Take 1 tablet (25 mg total) by mouth every 6 (six) hours as needed for nausea or vomiting. 30 tablet 0  . diphenhydrAMINE  (BENADRYL) 25 MG tablet Take 25 mg by mouth every 6 (six) hours as needed.    . Menthol, Topical Analgesic, (BIOFREEZE EX) Apply 1 application topically daily as needed. For back pain     No current facility-administered medications on file prior to visit.     Review of Systems Pertinent items noted in HPI and remainder of comprehensive ROS otherwise negative. Physical Exam:   Vitals:   07/07/18 1015  BP: 125/66  Pulse: 95  Weight: 147 lb 12.8 oz (67 kg)   Fetal Heart Rate (bpm): 147/150 on bedside ultrasound** Uterus:     Pelvic Exam: Perineum: no hemorrhoids, normal perineum   Vulva: normal external genitalia, no lesions   Vagina:  normal mucosa, normal discharge   Cervix: no lesions and normal, pap smear done.    Adnexa: normal adnexa and no mass, fullness, tenderness   Bony Pelvis: average  System: General: well-developed, well-nourished female in no acute distress   Breasts:  normal appearance, no masses or tenderness bilaterally   Skin: normal coloration and turgor, no rashes   Neurologic: oriented, normal, negative, normal mood   Extremities: normal strength, tone, and muscle mass, ROM of all joints is normal   HEENT PERRLA, extraocular movement intact and  sclera clear, anicteric   Mouth/Teeth mucous membranes moist, pharynx normal without lesions and dental hygiene good   Neck supple and no masses   Cardiovascular: regular rate and rhythm   Respiratory:  no respiratory distress, normal breath sounds   Abdomen: soft, non-tender; bowel sounds normal; no masses,  no organomegaly  **Bedside Ultrasound for FHR check: Patient informed that the ultrasound is considered a limited obstetric ultrasound and is not intended to be a complete ultrasound exam.  Patient also informed that the ultrasound is not being completed with the intent of assessing for fetal or placental anomalies or any pelvic abnormalities.  Explained that the purpose of today's ultrasound is to assess for fetal  heart rate.  Patient acknowledges the purpose of the exam and the limitations of the study.     Assessment:    Pregnancy: G1P0 Patient Active Problem List   Diagnosis Date Noted  . Migraine headache 07/07/2018  . Supervision of high risk pregnancy, antepartum 06/23/2018  . Dichorionic diamniotic twin gestation 06/23/2018  . 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 Scoliosis 06/23/2018  . Asthma affecting pregnancy, antepartum 06/23/2018     Plan:    1. Dichorionic diamniotic twin pregnancy in second trimester Labs and scans ordered. Discussed implications and risks of multiple gestation. ASA prescribed for PEC prophylaxis. - [place] MFM OB DETAIL +14 WK; Future - [place] MFM OB DETAIL ADDL GEST +14 WK; Future - Comprehensive metabolic panel - Hemoglobin A1c - TSH - Protein / creatinine ratio, urine  2. Headache in pregnancy, second trimester 3. Migraine without aura and without status migrainosus, not intractable Will try Imitrex and Tylenol for now, adequate hydration. If needed, may add Fioricet. - SUMAtriptan (IMITREX) 100 MG tablet; Take 1 tablet (100 mg total) by mouth once as needed for up to 1 dose for migraine. May repeat in 2 hours if headache persists or recurs.  Dispense: 20 tablet; Refill: 11  4. 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 She wants to try vaginal delivery, even with twins. She as told this will depend on fetal presentation etc but was assured that a trial of VD was very reasonable.  5. Supervision of high risk pregnancy, antepartum - Genetic Screening - Cytology - PAP( [place]) - aspirin 81 MG tablet; Take 1 tablet (81 mg total) by mouth daily.  Dispense: 30 tablet; Refill: 11 Initial labs reviewed Continue prenatal vitamins. Genetic Screening discussed, NIPS: ordered. Ultrasound discussed; fetal anatomic survey: ordered. Problem list reviewed and  updated. The nature of [facility] with multiple MDs and other Advanced Practice Providers was explained to patient; also emphasized that residents, students are part of our team. Routine obstetric precautions reviewed. Return in about 3 weeks (around 07/28/2018) for 2 hr GTT, AFP, OB Visit (HOB).     Jaynie Collins, MD, FACOG Obstetrician & Gynecologist, [facility] for [employer], [facility]

## 2018-07-08 ENCOUNTER — Encounter: Payer: Self-pay | Admitting: Obstetrics & Gynecology

## 2018-07-08 ENCOUNTER — Telehealth: Payer: Self-pay

## 2018-07-08 DIAGNOSIS — R945 Abnormal results of liver function studies: Secondary | ICD-10-CM

## 2018-07-08 DIAGNOSIS — R7989 Other specified abnormal findings of blood chemistry: Secondary | ICD-10-CM | POA: Insufficient documentation

## 2018-07-08 LAB — COMPREHENSIVE METABOLIC PANEL
ALT: 93 IU/L — ABNORMAL HIGH (ref 0–32)
AST: 61 IU/L — ABNORMAL HIGH (ref 0–40)
Albumin/Globulin Ratio: 1.7 (ref 1.2–2.2)
Albumin: 4.3 g/dL (ref 3.9–5.0)
Alkaline Phosphatase: 55 IU/L (ref 39–117)
BUN/Creatinine Ratio: 12 (ref 9–23)
BUN: 7 mg/dL (ref 6–20)
CHLORIDE: 102 mmol/L (ref 96–106)
CO2: 20 mmol/L (ref 20–29)
Calcium: 9.8 mg/dL (ref 8.7–10.2)
Creatinine, Ser: 0.58 mg/dL (ref 0.57–1.00)
GFR calc Af Amer: 149 mL/min/{1.73_m2} (ref >59)
GFR calc non Af Amer: 129 mL/min/{1.73_m2} (ref >59)
Globulin, Total: 2.6 g/dL (ref 1.5–4.5)
Glucose: 81 mg/dL (ref 65–99)
Potassium: 4.2 mmol/L (ref 3.5–5.2)
Sodium: 136 mmol/L (ref 134–144)
Total Protein: 6.9 g/dL (ref 6.0–8.5)

## 2018-07-08 LAB — CYTOLOGY - PAP: Diagnosis: NEGATIVE

## 2018-07-08 LAB — HEMOGLOBIN A1C
Est. average glucose Bld gHb Est-mCnc: 94 mg/dL
Hgb A1c MFr Bld: 4.9 % (ref 4.8–5.6)

## 2018-07-08 LAB — PROTEIN / CREATININE RATIO, URINE
Creatinine, Urine: 115.3 mg/dL
Protein, Ur: 10.7 mg/dL
Protein/Creat Ratio: 93 mg/g creat (ref 0–200)

## 2018-07-08 LAB — TSH: TSH: 1.53 u[IU]/mL (ref 0.450–4.500)

## 2018-07-08 NOTE — Telephone Encounter (Addendum)
-----   Message from Tereso Newcomer, MD sent at 07/08/2018  2:51 PM EDT ----- Patient's liver function tests are elevated. She has been taking a lot of Tylenol lately for headaches, this could be the cause.  Hep B surface antigen was negative. Needs to come in for labs to evaluate other possible causes of this elevation.  Please ask her to cut down on Tylenol intake for now until further evaluation.  Please call to inform patient of results and recommendations. Results were also released to MyChart and patient was given recommendations as indicated.  Notified pt of results and provider's recommendation. Verified pt's next appt.   Pt stated understanding with no further questions.

## 2018-07-08 NOTE — Addendum Note (Signed)
Addended by: Jaynie Collins A on: 07/08/2018 02:51 PM   Modules accepted: Orders

## 2018-07-08 NOTE — Progress Notes (Signed)
Patient's liver function tests are elevated. She has been taking a lot of Tylenol lately for headaches, this could be the cause.  Hep B surface antigen was negative. Needs to come in for labs to evaluate other possible causes of this elevation.  Please ask her to cut down on Tylenol intake for now until further evaluation.  Please call to inform patient of results and recommendations. Results were also released to MyChart and patient was given recommendations as indicated.

## 2018-07-14 ENCOUNTER — Encounter: Payer: Self-pay | Admitting: *Deleted

## 2018-07-20 ENCOUNTER — Encounter: Payer: Self-pay | Admitting: *Deleted

## 2018-07-21 ENCOUNTER — Encounter: Payer: Self-pay | Admitting: *Deleted

## 2018-07-27 ENCOUNTER — Other Ambulatory Visit: Payer: Self-pay | Admitting: *Deleted

## 2018-07-27 ENCOUNTER — Telehealth: Payer: Self-pay | Admitting: Obstetrics and Gynecology

## 2018-07-27 DIAGNOSIS — O30042 Twin pregnancy, dichorionic/diamniotic, second trimester: Secondary | ICD-10-CM

## 2018-07-27 DIAGNOSIS — O0990 Supervision of high risk pregnancy, unspecified, unspecified trimester: Secondary | ICD-10-CM

## 2018-07-27 NOTE — Telephone Encounter (Signed)
The patient called in for information on visitors. Informed of the no visitors restriction. Also informed of how she will check in.

## 2018-07-28 ENCOUNTER — Telehealth: Payer: Self-pay | Admitting: Family Medicine

## 2018-07-28 NOTE — Telephone Encounter (Signed)
Called the patient to inform of COVID19 restrictions and how the check in process occurs at this time. Offered the patient webex options- declined.

## 2018-07-29 ENCOUNTER — Ambulatory Visit (INDEPENDENT_AMBULATORY_CARE_PROVIDER_SITE_OTHER): Payer: Medicaid Other | Admitting: Family Medicine

## 2018-07-29 ENCOUNTER — Other Ambulatory Visit: Payer: Self-pay

## 2018-07-29 ENCOUNTER — Other Ambulatory Visit: Payer: Medicaid Other

## 2018-07-29 VITALS — BP 114/65 | HR 83 | Wt 158.5 lb

## 2018-07-29 DIAGNOSIS — O30042 Twin pregnancy, dichorionic/diamniotic, second trimester: Secondary | ICD-10-CM

## 2018-07-29 DIAGNOSIS — Z3A17 17 weeks gestation of pregnancy: Secondary | ICD-10-CM

## 2018-07-29 DIAGNOSIS — O0990 Supervision of high risk pregnancy, unspecified, unspecified trimester: Secondary | ICD-10-CM

## 2018-07-29 NOTE — Patient Instructions (Addendum)
AREA PEDIATRIC/FAMILY PRACTICE PHYSICIANS  Central/Southeast [place] [phone]) . [facility] Davy Pique, MD; Gwendlyn Deutscher, MD; Walker Kehr, MD; Andria Frames, MD; McDiarmid, MD; Dutch Quint, MD; Nori Riis, MD; Mingo Amber, [place]., [place], [place] [address] o [phone] o Mon-Fri 8:30-12:30, 1:30-5:00 o Providers come to see babies at [facility] o Accepting Medicaid . [place] [facility] [place] providers who accept newborns: Dorthy Cooler, MD; Orland Mustard, MD; Stephanie Acre, MD o [place], Rena [place], [place] [address] o [phone] o Mon-Fri 8:00-5:30 o Babies seen by providers at [facility] o Does NOT accept Medicaid o Please call early in hospitalization for appointment (limited availability)  . Mustard St. John, MD o [address], [place], [place] [address] o [phone] o Mon, Tue, Thur, Fri 8:30-5:00, Wed 10:00-7:00 (closed 1-2pm) o Babies seen by [facility] providers o Accepting Medicaid . [place], MD o [place], [place], [place] [address] o [phone] o Mon-Fri 8:30-5:00, Sat 8:30-12:00 o Provider comes to see babies at [place] Medicaid o Must have been referred from current patients or contacted office prior to delivery . [place] [facility] ([place] for [facility]) Franne Forts, MD; Tamera Punt, MD; Doneen Poisson, MD; Fatima Sanger, MD; Wynetta Emery, MD; Jess Barters, MD; Tami Ribas, MD; Herbert Moors, MD; Derrell Lolling, MD; Dorothyann Peng, MD; Lucious Groves, NP; Baldo Ash, NP o [address], [place], [place] [address] o [phone] o Mon, Tue, Thur, Fri 8:30-5:30, Wed 9:30-5:30, Sat 8:30-12:30 o Babies seen by [facility] providers o Accepting Medicaid o Only accepting infants of first-time parents or siblings of current patients [facility] discharge coordinator will make follow-up appointment . Baltazar Najjar o [place] [address],  [place], [place]  [address] o [phone]   Fax - [phone] . [facility] o [address], [place], [place]  [address] o Phone - [phone]   Fax [phone] . [place], [place], [place], [place]  [address] o [phone]  East/Northeast [place] [phone]) . [facility] Reginal Lutes, MD; Jacklynn Ganong, MD; Torrie Mayers, MD; MD; Rosana Hoes, MD; Servando Salina, MD; Rose Fillers, MD; Rex Kras, MD; Corinna Capra, MD; Volney American, MD; Trilby Drummer, MD; Janann Colonel, MD; Jimmye [place], [place], [place], [place] [address] o [phone] o Mon-Fri 8:30-5:00 (extended evenings Mon-Thur as needed), Sat-Sun 10:00-1:00 o Providers come to see babies at [place] Medicaid for families of first-time babies and families with all children in the household age 72 and under. Must register with office prior to making appointment (M-F only). . [place], NP; Tomi Bamberger, MD; Redmond School, MD; [place], [place] [address] [place], [place] [address] o [phone] o Mon-Fri 8:00-5:00 o Babies seen by providers at [facility] o Does NOT accept Medicaid/Commercial Insurance Only . [facility])  Marnee Guarneri, MD; Drema Dallas, MD; Montine Circle, MD; Vilma Prader, MD; Vanita Panda, MD; Alfonso Ramus, MD; Ruthann Cancer, MD; Roxanne Mins, MD; Rosalva Ferron, MD; Polly Cobia, MD o [address] [place], [place] [address] o [phone] o Mon-Fri 8:30-5:30, Sat (Oct.-Mar.) 9:00-1:00 o Babies seen by providers at [place] [phone]) . [facility] Peers, MD; Suzan Slick, MD o [address], [place],  [address] o [phone] o Mon-Fri 8:30-5:00, Sat 8:30-12:00 o Providers come to see babies at [facility] o Does NOT accept Medicaid . [facility] Ricci Barker, PA; Mannie Stabile, MD; [place], [place]; Nancy Fetter, MD; Moreen Fowler, [place],  [place], [place] [address] o [phone] o Mon-Fri 8:00-5:00 o Babies seen by providers at [facility] o Does NOT accept Medicaid o Only accepting babies of parents who are patients o Please call early in hospitalization for appointment (limited availability) . [facility] Pediatricians Blanca Friend, MD; Sharlene Motts, MD; Rod Can, MD; Warner Mccreedy, NP; Sabra Heck, MD; Ermalinda Memos, MD; Sharlett Iles, NP; Aurther Loft, MD; Jerrye Beavers, MD; Marcello Moores, MD; Berline Lopes, MD; Charolette Forward, MD o [address] [place], [place], [place] [address] o [phone] o Mon-Fri 8:00-5:00, Sat 9:00-12:00 o Providers come to see babies at [facility] o Does NOT accept [facility] [phone]) . [place] [facility] [place] providers accepting new patients: Dayna Ramus, NP; [place], [place], [place], [place] [address] o [phone] o Mon-Fri 8:00-5:00 o Babies seen by providers at [facility] o Does NOT accept Medicaid o Only accepting babies of parents who are patients o Please call early in hospitalization for appointment (limited availability) . [facility] Oswaldo Conroy, MD; Sheran Lawless, MD o [place]., [place], [place] [address] o [phone] (press 1 to schedule appointment) o Mon-Fri 8:00-5:00 o Providers come to see babies at [facility] o Does NOT accept Medicaid . [facility] Jodi Mourning, MD o [address], [place], [place] [address] o [phone] o Mon-Fri 8:30-5:00 (lunch 12:30-1:00), extended hours by appointment only Wed 5:00-6:30 o Babies seen by [facility] providers o Accepting Medicaid . [place] at Evalyn Casco, MD; [place], MD; Ethlyn Gallery, MD o [place], [place], [place] [address] o [phone] o Mon-Fri 8:00-5:00 o Babies seen by [facility] providers o Does NOT accept Medicaid . Therapist, music at [place], MD; Yong Channel, MD; [place], [place] [address] [place], [place]  [address] o [phone] o Mon-Fri 8:00-5:00 o Babies seen by [facility] providers o Does NOT accept Medicaid . [place], [place]; [place], [place]; [place], NP; Albertina Parr, MD; Frederic Jericho, MD; Ronney Lion, MD; Carlos Levering, NP; Jerelene Redden, NP; Tomasita Crumble, NP; Ronelle Nigh, NP; Corinna Lines, MD; [place], MD o [address]., [place], [place] [address] o [phone] o Mon-Fri 8:30-5:00, Sat 10:00-1:00 o Providers come to see babies at [facility] o Does NOT accept Medicaid o Free prenatal information session Tuesdays at 4:45pm . [facility] Porfirio Oar, MD; [place], [place]; [place], [place]; [place], [place]., [place] [address] o [phone] o Mon-Fri 7:30-5:30 o Babies seen by [facility] providers . [facility] o [address] [place], [place], [place]  [address] o [phone]   Fax - [phone]  [place] [phone] & [phone]) . [place], MD o [address] [place], [place] [address] o [phone] o Mon-Thur 8:00-6:00 o Providers come to see babies at [place] Medicaid . [place], NP; Melford Aase, MD; [place], [place]; [place], [place]., [place], [place] [address] o [phone] o Mon-Thur 7:30-7:30, Fri 7:30-4:30 o Babies seen by [facility] providers o Accepting Medicaid . [facility] Nyra Jabs, MD; Cristino Martes, NP; Gertie Baron, MD o [place], [place], [place] [address] o [phone] o Mon-Fri 8:30-5:00, Sat 8:30-12:00 o Providers come to see babies at [place] Medicaid o Must have "Meet & Greet" appointment at office prior to delivery . [place] ([place]) o [place],  MD; Juleen China, MD; Clydene Laming, [place] [address], [place], [place] [address] o [phone] o Mon-Wed 8:00-6:00, Thur-Fri 8:00-5:00, Sat 9:00-12:00 o Providers come to  see babies at [facility] o Does NOT accept Medicaid o Only accepting siblings of current patients . [facility] [place], [place], [place], [place]  [address] o [phone]   Fax [phone] . [place] at [place] [address], [place], [place]  [address] o [phone]   Fax - [place] Gorman [phone] & [phone]) . Therapist, music at [place], DO; [place], [place]., [place], [place] [address] o [phone] o Mon-Fri 7:00-5:00 o Babies seen by [facility] providers o Does NOT accept Medicaid . [place], MD; [place], [place]; [place], [place], [place], [place] [address] o [phone] o Mon-Fri 8:00-5:00 o Babies seen by [facility] providers o Accepting Medicaid . [place], MD; [place], [place]; [place], NP; [place], [place], [place], [place] [address] o [phone] o Mon-Fri 8:00-5:00 o Babies seen by providers at [place] [phone]) . [facility] [place], [place] o [address] [place], [place] [address] o [phone] o Mon-Fri 8:00-5:00 o Babies seen by [facility] providers o Does NOT accept Medicaid o Limited availability, please call early in hospitalization to schedule follow-up . [facility] Leilani Merl, PA; Maisie Fus, MD; [place], MD; [place], [place]; Jeannine Kitten, MD; [place], [place] [facility] [address], [place], [place] [address] o [phone] o Mon-Fri 8:30-5:00, Sat 9:00-12:00 o Babies seen by providers at [facility] o Accepting Medicaid o Please register online then schedule online or call office o [email or web address] . [place] ([place] at  [place]) Kristian Covey, NP; Dwyane Dee, MD; Leonidas Romberg, PA o [address] [place], [place], [place] [address] o [phone] o Mon-Fri 8:00-5:00 o Babies seen by providers at [place] o Accepting Medicaid . [place] ([facility] [employer]) Dairl Ponder, MD; Rayvon Char, NP; Melina Modena, MD o [address] [place], [place], [place] [address] o [phone] o Mon-Fri 8:00-5:30, Sat&Sun by appointment (phones open at 8:30) o Babies seen by [facility] providers o Accepting Medicaid o Must be a first-time baby or sibling of current patient . [place], [address], [place], [place]  [address] o [phone]   Fax - [phone]  [place] [phone] & [phone]) . [place], [place]; [place], [place]; Benjamine Mola, MD; [place], [place]; Harrell Lark, MD o [address] [place], [place] [address] o [phone] o Mon-Thur 8:00-7:00, Fri 8:00-5:00, Sat 8:00-12:00, Sun 9:00-12:00 o Babies seen by [facility] providers o Accepting Medicaid . [facility] at [facility], MD; Ruthann Cancer, MD; [facility], MD o [address], [place], [place] [address] o [phone] o Mon-Thur 8:00-5:00 o Babies seen by providers at [facility] o Accepting Medicaid . [facility] at [place], MD; Coe[place], MD; Amedeo Plenty, MD; Bobby Rumpf, MD; List, MD; Lavonia Drafts, MD; Ruthann Cancer, MD; Selinda Eon, MD; Audie Box, MD; Jim Like, MD; Christie Nottingham, MD; Hubbard Hartshorn, MD; Modena Nunnery, MD o [address]., [place], [place]  [address] o [phone] o Mon-Fri 8:00-5:30, Sat (Oct.-Mar.) 9:00-1:00 o Babies seen by providers at [facility] o Accepting Medicaid o Must fill out new patient packet, available online at [email or web address] . [place] ([facility]) Barnabas Lister, NP; Kenton Kingfisher, NP; Claiborne Billings, NP; Rolla Plate, MD;  [place], [place]; [place]ola Rhine, MD; Tyron Russell, MD; Delia Chimes, NP o [address], [place], [place] [address] o [phone] o Mon-Thur 8:00-5:30, Fri 8:00-5:00 o Babies seen by providers at [place] [phone]) . [place], [place]; [place], MD; Dennard Schaumann, MD; [place], [place] o [address] [place], [place] [address] o [phone] o Mon-Fri 8:00-5:00 o Babies seen by providers at [place] [phone]) . [place] at [place], [place]; Olen Pel, MD; [place], [place], [place], [place] [address] o [phone] o Mon-Fri 8:00-5:00 o Babies seen by providers at [facility] o Does NOT accept Medicaid o Limited appointment availability, please call early in hospitalization  . Therapist, music at [place], [place]; [place], [address], [place], [place] [address] o [phone] o Mon-Fri 8:00-5:00 o Babies seen by [facility] providers o Does NOT accept Medicaid . [facility] Su Grand, MD; Guy Sandifer, MD; [place], [place]; [place], [place] [address], [place], [place] [address] o [phone] o Mon-Fri 8:00-5:00 o After hours clinic [facility] [place], [place] [address]) [phone] Mon-Fri 5:00-8:00, Sat 12:00-6:00, Sun 10:00-4:00 o Babies seen by [facility] providers o Accepting Medicaid . [place] [facility] o [address] [place], [place]  [address] o [phone]   Fax - [phone]  [place] [phone]) . Therapist, music at [facility], MD o [address] [place], [place], [place] [address] o [phone] o Mon-Fri 8:00-5:00 o Babies seen by [facility] providers o Does NOT accept Medicaid . [place] ([place] at [place]) Bing Neighbors, MD o [address] [place], [place], [place]  [address] o [phone] o Mon-Thur 8:00-7:00, Fri 8:00-5:00, Sat 8:00-12:00 o Babies seen by providers at [facility] o Accepting Medicaid - but does not have vaccinations in office (must be received elsewhere) o Limited availability, please call early in hospitalization  [place] ([id number]) . [place], MD o [address], [place] [address] o [phone]  Fax [phone] Childbirth Education Options: [facility] Department Classes:  Childbirth education classes can help you get ready for a positive parenting experience. You can also meet other expectant parents and get free stuff for your baby. Each class runs for five weeks on the same night and costs $45 for the mother-to-be and her support person. Medicaid covers the cost if you are eligible. Call [phone] to register. [facility] Childbirth Education:  [phone] or [phone] or [email or web address]  Baby & Me Class: Discuss newborn & infant parenting and family adjustment issues with other new mothers in a relaxed environment. Each week brings a new speaker or baby-centered activity. We encourage new mothers to join [place] every Thursday at 11:00am. Babies birth until crawling. No registration or fee. Daddy [employer]: This course offers Dads-to-be the tools and knowledge needed to feel confident on their journey to becoming new fathers. Experienced dads, who have been trained as coaches, teach dads-to-be how to hold,  comfort, diaper, swaddle and play with their infant while being able to support the new mom as well. A class for men taught by men. $25/dad Big Brother/Big Sister: Let your children share in the joy of a new brother or sister in this special class designed just for them. Class includes discussion about how families care for babies: swaddling, holding, diapering, safety as well as how they can be helpful in their new role. This class is designed for  children ages 57 to 59, but any age is welcome. Please register each child individually. $5/child  Mom Talk: This mom-led group offers support and connection to mothers as they journey through the adjustments and struggles of that sometimes overwhelming first year after the birth of a child. Tuesdays at 10:00am and Thursdays at 6:00pm. Babies welcome. No registration or fee. Breastfeeding Support Group: This group is a mother-to-mother support circle where moms have the opportunity to share their breastfeeding experiences. A Lactation Consultant is present for questions and concerns. Meets each Tuesday at 11:00am. No fee or registration. Breastfeeding Your Baby: Learn what to expect in the first days of breastfeeding your newborn.  This class will help you feel more confident with the skills needed to begin your breastfeeding experience. Many new mothers are concerned about breastfeeding after leaving the hospital. This class will also address the most common fears and challenges about breastfeeding during the first few weeks, months and beyond. (call for fee) Comfort Techniques and Tour: This 2 hour interactive class will provide you the opportunity to learn & practice hands-on techniques that can help relieve some of the discomfort of labor and encourage your baby to rotate toward the best position for birth. You and your partner will be able to try a variety of labor positions with birth balls and rebozos as well as practice breathing, relaxation, and visualization techniques. A tour of the [facility] is included with this class. $20 per registrant and support person Childbirth Class- Weekend Option: This class is a Weekend version of our Birth & Baby series. It is designed for parents who have a difficult time fitting several weeks of classes into their schedule. It covers the care of your newborn and the basics of labor and childbirth. It also includes a [place]  [facility] and lunch. The class is held two consecutive days: beginning on Friday evening from 6:30 - 8:30 p.m. and the next day, Saturday from 9 a.m. - 4 p.m. (call for fee) Doren Custard Class: Interested in a waterbirth?  This informational class will help you discover whether waterbirth is the right fit for you. Education about waterbirth itself, supplies you would need and how to assemble your support team is what you can expect from this class. Some obstetrical practices require this class in order to pursue a waterbirth. (Not all obstetrical practices offer waterbirth-check with your healthcare provider.) Register only the expectant mom, but you are encouraged to bring your partner to class! Required if planning waterbirth, no fee. Infant/Child CPR: Parents, grandparents, babysitters, and friends learn Cardio-Pulmonary Resuscitation skills for infants and children. You will also learn how to treat both conscious and unconscious choking in infants and children. This Family & Friends program does not offer certification. Register each participant individually to ensure that enough mannequins are available. (Call for fee) Grandparent Love: Expecting a grandbaby? This class is for you! Learn about the latest infant care and safety recommendations and ways to support your own child as he  or she transitions into the parenting role. Taught by Registered Nurses who are childbirth instructors, but most importantly...they are grandmothers too! $10/person. Childbirth Class- Natural Childbirth: This series of 5 weekly classes is for expectant parents who want to learn and practice natural methods of coping with the process of labor and childbirth. Relaxation, breathing, massage, visualization, role of the partner, and helpful positioning are highlighted. Participants learn how to be confident in their body's ability to give birth. This class will empower and help parents make informed decisions about their own  care. Includes discussion that will help new parents transition into the immediate postpartum period. [facility] is included. We suggest taking this class between 25-32 weeks, but it's only a recommendation. $75 per registrant and one support person or $30 Medicaid. Childbirth Class- 3 week Series: This option of 3 weekly classes helps you and your labor partner prepare for childbirth. Newborn care, labor & birth, cesarean birth, pain management, and comfort techniques are discussed and a [place] [facility] is included. The class meets at the same time, on the same day of the week for 3 consecutive weeks beginning with the starting date you choose. $60 for registrant and one support person.  Marvelous Multiples: Expecting twins, triplets, or more? This class covers the differences in labor, birth, parenting, and breastfeeding issues that face multiples' parents. NICU tour is included. Led by a Certified Childbirth Educator who is the mother of twins. No fee. Caring for Baby: This class is for expectant and adoptive parents who want to learn and practice the most up-to-date newborn care for their babies. Focus is on birth through the first six weeks of life. Topics include feeding, bathing, diapering, crying, umbilical cord care, circumcision care and safe sleep. Parents learn to recognize symptoms of illness and when to call the pediatrician. Register only the mom-to-be and your partner or support person can plan to come with you! $10 per registrant and support person Childbirth Class- online option: This online class offers you the freedom to complete a Birth and Baby series in the comfort of your own home. The flexibility of this option allows you to review sections at your own pace, at times convenient to you and your support people. It includes additional video information, animations, quizzes, and extended activities. Get organized with helpful  eClass tools, checklists, and trackers. Once you register online for the class, you will receive an email within a few days to accept the invitation and begin the class when the time is right for you. The content will be available to you for 60 days. $60 for 60 days of online access for you and your support people.  Local Doulas: Natural Baby Doulas [email or web address] Tel: [phone] [email or web address] [employer] [phone] [email or web address] .[email or web address] The Labor Hassell Halim  (also do waterbirth tub rental) [phone] [email or web address] Triad Birth Doula [phone] [email or web address] [email or web address].fi Sacred Rhythms  [phone] https://[email or web address]-[email or web address] [employer] (PADA) [email or web address].[email or web address]/ [place] Birth and Baby  [email or web address] Considering Waterbirth? Guide for patients at Center for [employer]  Why consider waterbirth?  . Gentle birth for babies . Less pain medicine used in labor . May allow for passive descent/less pushing . May reduce perineal tears  . More mobility and instinctive maternal position changes . Increased maternal relaxation . Reduced blood pressure in labor  Is waterbirth safe? What are the risks of infection, drowning or other complications?  . Infection: o Very low risk (3.7 % for tub  vs 4.8% for bed) o 7 in 8000 waterbirths with documented infection o Poorly cleaned equipment most common cause o Slightly lower group B strep transmission rate  . Drowning o Maternal:  - Very low risk   - Related to seizures or fainting o Newborn:  - Very low risk. No evidence of increased risk of respiratory problems in multiple large studies - Physiological protection from breathing under water - Avoid underwater birth if there are any fetal  complications - Once baby's head is out of the water, keep it out.  . Birth complication o Some reports of cord trauma, but risk decreased by bringing baby to surface gradually o No evidence of increased risk of shoulder dystocia. Mothers can usually change positions faster in water than in a bed, possibly aiding the maneuvers to free the shoulder.   You must attend a Doren Custard class at [facility]  3rd Wednesday of every month from 7-9pm  [employer] by calling [phone] or online at [email or web address]  Bring [place] the certificate from the class to your prenatal appointment  Meet with a midwife at 36 weeks to see if you can still plan a waterbirth and to sign the consent.   Purchase or rent the following supplies:   Water Birth Pool (Birth Pool in a Box or [place] for instance)  (Tubs start ~$125)  Single-use disposable tub liner designed for your brand of tub  New garden hose labeled "lead-free", "suitable for drinking water",  Electric drain pump to remove water (We recommend 792 gallon per hour or greater pump.)   Separate garden hose to remove the dirty water  Fish net  Bathing suit top (optional)  Long-handled mirror (optional)  Places to purchase or rent supplies  [employer] for tub purchases and supplies  Waterbirthsolutions.[email or web address] for tub purchases and supplies  [employer] (www.thelaborladies.com) $275 for tub rental/set-up & take down/kit   [employer] (http://www.fleming.com/.htm) Information regarding doulas (labor support) who provide pool rentals  Our practice has a Birth Pool in a Box tub at the hospital that you may borrow on a first-come-first-served basis. It is your responsibility to to set up, clean and break down the tub. We cannot guarantee the availability of this tub in advance. You are responsible for bringing all accessories listed above. If you do not have all necessary supplies you cannot  have a waterbirth.    Things that would prevent you from having a waterbirth:  Premature, <37wks  Previous cesarean birth  Presence of thick meconium-stained fluid  Multiple gestation (Twins, triplets, etc.)  Uncontrolled diabetes or gestational diabetes requiring medication  Hypertension requiring medication or diagnosis of pre-eclampsia  Heavy vaginal bleeding  Non-reassuring fetal heart rate  Active infection (MRSA, etc.). Group B Strep is NOT a contraindication for  waterbirth.  If your labor has to be induced and induction method requires continuous  monitoring of the baby's heart rate  Other risks/issues identified by your obstetrical provider  Please remember that birth is unpredictable. Under certain unforeseeable circumstances your provider may advise against giving birth in the tub. These decisions will be made on a case-by-case basis and with the safety of you and your baby as our highest priority.    Places to have your son circumcised:  [facility]     [phone]   $480 while you are in hospital         [facility]              [phone]   $269 by 4 wks                      [facility]                     [phone]   $269 by 7 days [facility]                    [phone]   $269 by 4 wks [facility]             [phone]   $175 by 2 wks    These prices sometimes change but are roughly what you can expect to pay. Please call and confirm pricing.   Circumcision is considered an elective/non-medically necessary procedure. There are many reasons parents decide to have their sons circumsized. During the first year of life circumcised males have a reduced risk of urinary tract infections but after this year the rates between circumcised males and uncircumcised males are the same.  It is safe to have your son circumcised outside of the hospital and the places above perform them regularly.   Deciding about  Circumcision in Baby Boys  (Up-to-date The Basics)  What is circumcision?   Circumcision is a surgery that removes the skin that covers the tip of the penis, called the "foreskin" Circumcision is usually done when a boy is between 62 and 17 days old. In the [place], circumcision is common. In some other countries, fewer boys are circumcised. Circumcision is a common tradition in some religions.  Should I have my baby boy circumcised?   There is no easy answer. Circumcision has some benefits. But it also has risks. After talking with your doctor, you will have to decide for yourself what is right for your family.  What are the benefits of circumcision?   Circumcised boys seem to have slightly lower rates of: ?Urinary tract infections ?Swelling of the opening at the tip of the penis Circumcised men seem to have slightly lower rates of: ?Urinary tract infections ?Swelling of the opening at the tip of the penis ?Penis cancer ?HIV and other infections that you catch during sex ?Cervical cancer in the women they have sex with Even so, in the [place], the risks of these problems are small - even in boys and men who have not been circumcised. Plus, boys and men who are not circumcised can reduce these extra risks by: ?Cleaning their penis well ?Using condoms during sex  What are the risks of circumcision?  Risks include: ?Bleeding or infection from the surgery ?Damage to or amputation of the penis ?A chance that the doctor will cut off too much or not enough of the foreskin ?A chance that sex won't feel as good later in life Only about 1 out of every 200 circumcisions leads to problems. There is also a chance that your health insurance won't pay for circumcision.  How is circumcision done in baby boys?  First, the baby gets medicine for pain relief. This might be a cream on the skin or a shot into the base of the penis. Next, the doctor cleans the baby's penis well. Then he or  she uses special tools to cut off the foreskin. Finally, the doctor  wraps a bandage (called gauze) around the baby's penis. If you have your baby circumcised, his doctor or nurse will give you instructions on how to care for him after the surgery. It is important that you follow those instructions carefully.   Breastfeeding  Choosing to breastfeed is one of the best decisions you can make for yourself and your baby. A change in hormones during pregnancy causes your breasts to make breast milk in your milk-producing glands. Hormones prevent breast milk from being released before your baby is born. They also prompt milk flow after birth. Once breastfeeding has begun, thoughts of your baby, as well as his or her sucking or crying, can stimulate the release of milk from your milk-producing glands. Benefits of breastfeeding Research shows that breastfeeding offers many health benefits for infants and mothers. It also offers a cost-free and convenient way to feed your baby. For your baby  Your first milk (colostrum) helps your baby's digestive system to function better.  Special cells in your milk (antibodies) help your baby to fight off infections.  Breastfed babies are less likely to develop asthma, allergies, obesity, or type 2 diabetes. They are also at lower risk for sudden infant death syndrome (SIDS).  Nutrients in breast milk are better able to meet your baby's needs compared to infant formula.  Breast milk improves your baby's brain development. For you  Breastfeeding helps to create a very special bond between you and your baby.  Breastfeeding is convenient. Breast milk costs nothing and is always available at the correct temperature.  Breastfeeding helps to burn calories. It helps you to lose the weight that you gained during pregnancy.  Breastfeeding makes your uterus return faster to its size before pregnancy. It also slows bleeding (lochia) after you give birth.  Breastfeeding  helps to lower your risk of developing type 2 diabetes, osteoporosis, rheumatoid arthritis, cardiovascular disease, and breast, ovarian, uterine, and endometrial cancer later in life. Breastfeeding basics Starting breastfeeding  Find a comfortable place to sit or lie down, with your neck and back well-supported.  Place a pillow or a rolled-up blanket under your baby to bring him or her to the level of your breast (if you are seated). Nursing pillows are specially designed to help support your arms and your baby while you breastfeed.  Make sure that your baby's tummy (abdomen) is facing your abdomen.  Gently massage your breast. With your fingertips, massage from the outer edges of your breast inward toward the nipple. This encourages milk flow. If your milk flows slowly, you may need to continue this action during the feeding.  Support your breast with 4 fingers underneath and your thumb above your nipple (make the letter "C" with your hand). Make sure your fingers are well away from your nipple and your baby's mouth.  Stroke your baby's lips gently with your finger or nipple.  When your baby's mouth is open wide enough, quickly bring your baby to your breast, placing your entire nipple and as much of the areola as possible into your baby's mouth. The areola is the colored area around your nipple. ? More areola should be visible above your baby's upper lip than below the lower lip. ? Your baby's lips should be opened and extended outward (flanged) to ensure an adequate, comfortable latch. ? Your baby's tongue should be between his or her lower gum and your breast.  Make sure that your baby's mouth is correctly positioned around your nipple (latched). Your baby's lips should create  a seal on your breast and be turned out (everted).  It is common for your baby to suck about 2-3 minutes in order to start the flow of breast milk. Latching Teaching your baby how to latch onto your breast properly  is very important. An improper latch can cause nipple pain, decreased milk supply, and poor weight gain in your baby. Also, if your baby is not latched onto your nipple properly, he or she may swallow some air during feeding. This can make your baby fussy. Burping your baby when you switch breasts during the feeding can help to get rid of the air. However, teaching your baby to latch on properly is still the best way to prevent fussiness from swallowing air while breastfeeding. Signs that your baby has successfully latched onto your nipple  Silent tugging or silent sucking, without causing you pain. Infant's lips should be extended outward (flanged).  Swallowing heard between every 3-4 sucks once your milk has started to flow (after your let-down milk reflex occurs).  Muscle movement above and in front of his or her ears while sucking. Signs that your baby has not successfully latched onto your nipple  Sucking sounds or smacking sounds from your baby while breastfeeding.  Nipple pain. If you think your baby has not latched on correctly, slip your finger into the corner of your baby's mouth to break the suction and place it between your baby's gums. Attempt to start breastfeeding again. Signs of successful breastfeeding Signs from your baby  Your baby will gradually decrease the number of sucks or will completely stop sucking.  Your baby will fall asleep.  Your baby's body will relax.  Your baby will retain a small amount of milk in his or her mouth.  Your baby will let go of your breast by himself or herself. Signs from you  Breasts that have increased in firmness, weight, and size 1-3 hours after feeding.  Breasts that are softer immediately after breastfeeding.  Increased milk volume, as well as a change in milk consistency and color by the fifth day of breastfeeding.  Nipples that are not sore, cracked, or bleeding. Signs that your baby is getting enough milk  Wetting at least  1-2 diapers during the first 24 hours after birth.  Wetting at least 5-6 diapers every 24 hours for the first week after birth. The urine should be clear or pale yellow by the age of 5 days.  Wetting 6-8 diapers every 24 hours as your baby continues to grow and develop.  At least 3 stools in a 24-hour period by the age of 5 days. The stool should be soft and yellow.  At least 3 stools in a 24-hour period by the age of 7 days. The stool should be seedy and yellow.  No loss of weight greater than 10% of birth weight during the first 3 days of life.  Average weight gain of 4-7 oz (113-198 g) per week after the age of 4 days.  Consistent daily weight gain by the age of 5 days, without weight loss after the age of 2 weeks. After a feeding, your baby may spit up a small amount of milk. This is normal. Breastfeeding frequency and duration Frequent feeding will help you make more milk and can prevent sore nipples and extremely full breasts (breast engorgement). Breastfeed when you feel the need to reduce the fullness of your breasts or when your baby shows signs of hunger. This is called "breastfeeding on demand." Signs that  your baby is hungry include:  Increased alertness, activity, or restlessness.  Movement of the head from side to side.  Opening of the mouth when the corner of the mouth or cheek is stroked (rooting).  Increased sucking sounds, smacking lips, cooing, sighing, or squeaking.  Hand-to-mouth movements and sucking on fingers or hands.  Fussing or crying. Avoid introducing a pacifier to your baby in the first 4-6 weeks after your baby is born. After this time, you may choose to use a pacifier. Research has shown that pacifier use during the first year of a baby's life decreases the risk of sudden infant death syndrome (SIDS). Allow your baby to feed on each breast as long as he or she wants. When your baby unlatches or falls asleep while feeding from the first breast, offer the  second breast. Because newborns are often sleepy in the first few weeks of life, you may need to awaken your baby to get him or her to feed. Breastfeeding times will vary from baby to baby. However, the following rules can serve as a guide to help you make sure that your baby is properly fed:  Newborns (babies 69 weeks of age or younger) may breastfeed every 1-3 hours.  Newborns should not go without breastfeeding for longer than 3 hours during the day or 5 hours during the night.  You should breastfeed your baby a minimum of 8 times in a 24-hour period. Breast milk pumping     Pumping and storing breast milk allows you to make sure that your baby is exclusively fed your breast milk, even at times when you are unable to breastfeed. This is especially important if you go back to work while you are still breastfeeding, or if you are not able to be present during feedings. Your lactation consultant can help you find a method of pumping that works best for you and give you guidelines about how long it is safe to store breast milk. Caring for your breasts while you breastfeed Nipples can become dry, cracked, and sore while breastfeeding. The following recommendations can help keep your breasts moisturized and healthy:  Avoid using soap on your nipples.  Wear a supportive bra designed especially for nursing. Avoid wearing underwire-style bras or extremely tight bras (sports bras).  Air-dry your nipples for 3-4 minutes after each feeding.  Use only cotton bra pads to absorb leaked breast milk. Leaking of breast milk between feedings is normal.  Use lanolin on your nipples after breastfeeding. Lanolin helps to maintain your skin's normal moisture barrier. Pure lanolin is not harmful (not toxic) to your baby. You may also hand express a few drops of breast milk and gently massage that milk into your nipples and allow the milk to air-dry. In the first few weeks after giving birth, some women experience  breast engorgement. Engorgement can make your breasts feel heavy, warm, and tender to the touch. Engorgement peaks within 3-5 days after you give birth. The following recommendations can help to ease engorgement:  Completely empty your breasts while breastfeeding or pumping. You may want to start by applying warm, moist heat (in the shower or with warm, water-soaked hand towels) just before feeding or pumping. This increases circulation and helps the milk flow. If your baby does not completely empty your breasts while breastfeeding, pump any extra milk after he or she is finished.  Apply ice packs to your breasts immediately after breastfeeding or pumping, unless this is too uncomfortable for you. To do this: ?  Put ice in a plastic bag. ? Place a towel between your skin and the bag. ? Leave the ice on for 20 minutes, 2-3 times a day.  Make sure that your baby is latched on and positioned properly while breastfeeding. If engorgement persists after 48 hours of following these recommendations, contact your health care provider or a Science writer. Overall health care recommendations while breastfeeding  Eat 3 healthy meals and 3 snacks every day. Well-nourished mothers who are breastfeeding need an additional 450-500 calories a day. You can meet this requirement by increasing the amount of a balanced diet that you eat.  Drink enough water to keep your urine pale yellow or clear.  Rest often, relax, and continue to take your prenatal vitamins to prevent fatigue, stress, and low vitamin and mineral levels in your body (nutrient deficiencies).  Do not use any products that contain nicotine or tobacco, such as cigarettes and e-cigarettes. Your baby may be harmed by chemicals from cigarettes that pass into breast milk and exposure to secondhand smoke. If you need help quitting, ask your health care provider.  Avoid alcohol.  Do not use illegal drugs or marijuana.  Talk with your health care  provider before taking any medicines. These include over-the-counter and prescription medicines as well as vitamins and herbal supplements. Some medicines that may be harmful to your baby can pass through breast milk.  It is possible to become pregnant while breastfeeding. If birth control is desired, ask your health care provider about options that will be safe while breastfeeding your baby. Where to find more information: [employer]: www.llli.org Contact a health care provider if:  You feel like you want to stop breastfeeding or have become frustrated with breastfeeding.  Your nipples are cracked or bleeding.  Your breasts are red, tender, or warm.  You have: ? Painful breasts or nipples. ? A swollen area on either breast. ? A fever or chills. ? Nausea or vomiting. ? Drainage other than breast milk from your nipples.  Your breasts do not become full before feedings by the fifth day after you give birth.  You feel sad and depressed.  Your baby is: ? Too sleepy to eat well. ? Having trouble sleeping. ? More than 63 week old and wetting fewer than 6 diapers in a 24-hour period. ? Not gaining weight by 16 days of age.  Your baby has fewer than 3 stools in a 24-hour period.  Your baby's skin or the white parts of his or her eyes become yellow. Get help right away if:  Your baby is overly tired (lethargic) and does not want to wake up and feed.  Your baby develops an unexplained fever. Summary  Breastfeeding offers many health benefits for infant and mothers.  Try to breastfeed your infant when he or she shows early signs of hunger.  Gently tickle or stroke your baby's lips with your finger or nipple to allow the baby to open his or her mouth. Bring the baby to your breast. Make sure that much of the areola is in your baby's mouth. Offer one side and burp the baby before you offer the other side.  Talk with your health care provider or lactation consultant  if you have questions or you face problems as you breastfeed. This information is not intended to replace advice given to you by your health care provider. Make sure you discuss any questions you have with your health care provider. Document Released: 04/15/2005 Document Revised: 05/17/2016  Document Reviewed: 05/17/2016 Elsevier Interactive Patient Education  [employer].

## 2018-07-29 NOTE — Progress Notes (Signed)
   PRENATAL VISIT NOTE  Subjective:  DORIAN CUTTS is a 23 y.o. G1P0 at [redacted]w[redacted]d being seen today for ongoing prenatal care.  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  Patient reports no complaints.  Contractions: Not present. Vag. Bleeding: None.  Movement: Present. Denies leaking of fluid.   The following portions of the patient's history were reviewed and updated as appropriate: allergies, current medications, past family history, past medical history, past social history, past surgical history and problem list.   Objective:   Vitals:   07/29/18 0840  BP: 114/65  Pulse: 83  Weight: 158 lb 8 oz (71.9 kg)    Fetal Status: Fetal Heart Rate (bpm): 140/+ on [place] x 2   Movement: Present     General:  Alert, oriented and cooperative. Patient is in no acute distress.  Skin: Skin is warm and dry. No rash noted.   Cardiovascular: Normal heart rate noted  Respiratory: Normal respiratory effort, no problems with respiration noted  Abdomen: Soft, gravid, appropriate for gestational age.  Pain/Pressure: Present     Pelvic: Cervical exam deferred        Extremities: Normal range of motion.  Edema: None  Mental Status: Normal mood and affect. Normal behavior. Normal judgment and thought content.   Assessment and Plan:  Pregnancy: G1P0 at [redacted]w[redacted]d 1. Supervision of high risk pregnancy, antepartum  - [facility] AMB BABYSCRIPTS SCHEDULE OPTIMIZATION  2. Dichorionic diamniotic twin pregnancy in second trimester Has anatomy US scheduled  General obstetric precautions including but not limited to vaginal bleeding, contractions, leaking of fluid and fetal movement were reviewed in detail with the patient. Please  refer to After Visit Summary for other counseling recommendations.   Return for 10 wks in person with 28 wk labs, 4.5 wks for virtual.  Future Appointments  Date Time Provider Department Center  08/06/2018 10:00 AM [facility] NURSE [facility]  08/06/2018 10:00 AM [facility] [place] 3 [facility]  08/27/2018  3:55 PM Adam Phenix, MD [facility]    Reva Bores, MD

## 2018-07-29 NOTE — Progress Notes (Signed)
Order placed for baby scripts optimization.  Pt reports she does not have access to a cuff at home.  Pt given a non baby scripts cuff.   Pt set up babyscripts account and downloaded the app successfully and logged in.  Pt account manually changed so that pt could manually pt in BP.  Pt verbalized and demonstrated understanding of all teaching, including use of the app, proper use of the BP cuff, proper positioning for taking BP and how to enter BP.

## 2018-08-06 ENCOUNTER — Ambulatory Visit (HOSPITAL_COMMUNITY): Payer: Self-pay

## 2018-08-06 ENCOUNTER — Other Ambulatory Visit (HOSPITAL_COMMUNITY): Payer: Medicaid Other

## 2018-08-20 ENCOUNTER — Other Ambulatory Visit (HOSPITAL_COMMUNITY): Payer: Medicaid Other

## 2018-08-20 ENCOUNTER — Ambulatory Visit (HOSPITAL_COMMUNITY): Payer: Medicaid Other

## 2018-08-26 ENCOUNTER — Telehealth: Payer: Self-pay | Admitting: Medical

## 2018-08-26 NOTE — Telephone Encounter (Signed)
Called the patient to inform of the virtual appointment scheduled for tomorrow. The patient stated she is not home and cant download the app at this time. Offered the option to send an email via mychart. The patient stated it would be beneficial for her. She will download the app later this evening.

## 2018-08-27 ENCOUNTER — Ambulatory Visit (INDEPENDENT_AMBULATORY_CARE_PROVIDER_SITE_OTHER): Payer: Medicaid Other | Admitting: Family Medicine

## 2018-08-27 ENCOUNTER — Other Ambulatory Visit: Payer: Self-pay

## 2018-08-27 VITALS — BP 119/76 | HR 100 | Wt 167.4 lb

## 2018-08-27 DIAGNOSIS — O0990 Supervision of high risk pregnancy, unspecified, unspecified trimester: Secondary | ICD-10-CM

## 2018-08-27 DIAGNOSIS — O30049 Twin pregnancy, dichorionic/diamniotic, unspecified trimester: Secondary | ICD-10-CM

## 2018-08-27 DIAGNOSIS — O30042 Twin pregnancy, dichorionic/diamniotic, second trimester: Secondary | ICD-10-CM

## 2018-08-27 DIAGNOSIS — Z3A21 21 weeks gestation of pregnancy: Secondary | ICD-10-CM

## 2018-08-27 DIAGNOSIS — O0992 Supervision of high risk pregnancy, unspecified, second trimester: Secondary | ICD-10-CM

## 2018-08-27 MED ORDER — WRIST SPLINT/COCK-UP/RIGHT SM MISC: 1.0000 [IU] | Freq: Every evening | 0 refills | Status: DC | PRN

## 2018-08-27 NOTE — Progress Notes (Signed)
I connected with  Linda Berry on 08/27/18 at  3:55 PM EDT by telephone and verified that I am speaking with the correct person using two identifiers.   I discussed the limitations, risks, security and privacy concerns of performing an evaluation and management service by telephone and the availability of in person appointments. I also discussed with the patient that there may be a patient responsible charge related to this service. The patient expressed understanding and agreed to proceed.  She is active in Babyscripts and last recorded bp was 129/77 on 08/17/18. I asked her to take bp today and record in app. C/o numbness in wrists.  Caedence Snowden,RN 08/27/2018  3:46 PM

## 2018-08-27 NOTE — Progress Notes (Signed)
   TELEHEALTH VIRTUAL OBSTETRICS PRENATAL VISIT ENCOUNTER NOTE  I connected with [name] on 08/27/18 at  3:55 PM EDT by WebEx at home and verified that I am speaking with the correct person using two identifiers.   I discussed the limitations, risks, security and privacy concerns of performing an evaluation and management service by telephone and the availability of in person appointments. I also discussed with the patient that there may be a patient responsible charge related to this service. The patient expressed understanding and agreed to proceed. Subjective:  Linda Berry is a 23 y.o. G1P0 at [redacted]w[redacted]d being seen today for ongoing prenatal care.  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  Patient reports carpal tunnel symptoms.  Reports fetal movement. Contractions: Not present. Vag. Bleeding: None.  Movement: Present. Denies any contractions, bleeding or leaking of fluid.   The following portions of the patient's history were reviewed and updated as appropriate: allergies, current medications, past family history, past medical history, past social history, past surgical history and problem list.   Objective:   Vitals:   08/27/18 1549  BP: 119/76  Pulse: 100  Weight: 167 lb 6.4 oz (75.9 kg)    Fetal Status:     Movement: Present     General:  Alert, oriented and cooperative. Patient is in no acute distress.  Respiratory: Normal respiratory effort, no problems with respiration noted  Mental Status: Normal mood and affect. Normal behavior. Normal judgment and thought content.  Rest of physical exam deferred due to type of encounter  Assessment and Plan:  Pregnancy: G1P0 at [redacted]w[redacted]d  1. Supervision of high risk pregnancy, antepartum Splint prescribed.  2. Dichorionic diamniotic twin pregnancy, antepartum Discussed diet - recommendations given. [place] tomorrow.  Preterm labor symptoms and general obstetric precautions including but not limited to vaginal bleeding, contractions, leaking of fluid and fetal movement were reviewed in detail with the patient. I discussed the assessment and treatment plan with the patient. The patient was provided an opportunity to ask questions and all were answered. The patient agreed with the plan and demonstrated an understanding of the instructions. The patient was advised to call back or seek an in-person office evaluation/go to [facility] at [facility] for any urgent or concerning symptoms. Please refer to After Visit Summary for other counseling recommendations.   I provided 15 minutes of face-to-face via WebEx time during this encounter.  No follow-ups on file.  Future Appointments  Date Time Provider Department Center  08/31/2018  1:40 PM [facility] NURSE [facility]  08/31/2018  1:45 PM [facility] [place] 2 [facility]    Levie Heritage, DO [facility] [employer], [facility]

## 2018-08-31 ENCOUNTER — Ambulatory Visit (HOSPITAL_COMMUNITY)
Admission: RE | Admit: 2018-08-31 | Discharge: 2018-08-31 | Disposition: A | Discharge status: Home / Self Care | Payer: Medicaid Other | Source: Ambulatory Visit | Attending: Obstetrics and Gynecology | Admitting: Obstetrics and Gynecology

## 2018-08-31 ENCOUNTER — Ambulatory Visit (HOSPITAL_COMMUNITY): Payer: Medicaid Other | Admitting: *Deleted

## 2018-08-31 ENCOUNTER — Encounter (HOSPITAL_COMMUNITY): Payer: Self-pay

## 2018-08-31 ENCOUNTER — Other Ambulatory Visit: Payer: Self-pay

## 2018-08-31 VITALS — BP 129/70 | HR 92 | Temp 98.7°F

## 2018-08-31 DIAGNOSIS — O30042 Twin pregnancy, dichorionic/diamniotic, second trimester: Secondary | ICD-10-CM | POA: Diagnosis not present

## 2018-08-31 DIAGNOSIS — O99519 Diseases of the respiratory system complicating pregnancy, unspecified trimester: Secondary | ICD-10-CM

## 2018-08-31 DIAGNOSIS — Z3A22 22 weeks gestation of pregnancy: Secondary | ICD-10-CM | POA: Diagnosis not present

## 2018-08-31 DIAGNOSIS — Z363 Encounter for antenatal screening for malformations: Secondary | ICD-10-CM

## 2018-08-31 DIAGNOSIS — J45909 Unspecified asthma, uncomplicated: Secondary | ICD-10-CM

## 2018-08-31 DIAGNOSIS — R945 Abnormal results of liver function studies: Secondary | ICD-10-CM

## 2018-08-31 DIAGNOSIS — O0990 Supervision of high risk pregnancy, unspecified, unspecified trimester: Secondary | ICD-10-CM | POA: Diagnosis not present

## 2018-08-31 DIAGNOSIS — R7989 Other specified abnormal findings of blood chemistry: Secondary | ICD-10-CM

## 2018-08-31 DIAGNOSIS — O30049 Twin pregnancy, dichorionic/diamniotic, unspecified trimester: Secondary | ICD-10-CM

## 2018-09-01 ENCOUNTER — Other Ambulatory Visit (HOSPITAL_COMMUNITY): Payer: Self-pay | Admitting: *Deleted

## 2018-09-01 DIAGNOSIS — O30042 Twin pregnancy, dichorionic/diamniotic, second trimester: Secondary | ICD-10-CM

## 2018-09-25 ENCOUNTER — Other Ambulatory Visit: Payer: Self-pay

## 2018-09-25 ENCOUNTER — Ambulatory Visit (INDEPENDENT_AMBULATORY_CARE_PROVIDER_SITE_OTHER): Payer: Medicaid Other | Admitting: Obstetrics and Gynecology

## 2018-09-25 ENCOUNTER — Other Ambulatory Visit: Payer: Medicaid Other

## 2018-09-25 ENCOUNTER — Encounter: Payer: Self-pay | Admitting: Obstetrics and Gynecology

## 2018-09-25 VITALS — BP 121/81 | HR 112 | Temp 97.9°F | Wt 171.4 lb

## 2018-09-25 DIAGNOSIS — Z3A26 26 weeks gestation of pregnancy: Secondary | ICD-10-CM

## 2018-09-25 DIAGNOSIS — O0992 Supervision of high risk pregnancy, unspecified, second trimester: Secondary | ICD-10-CM | POA: Diagnosis not present

## 2018-09-25 DIAGNOSIS — O0990 Supervision of high risk pregnancy, unspecified, unspecified trimester: Secondary | ICD-10-CM | POA: Diagnosis not present

## 2018-09-25 DIAGNOSIS — Z23 Encounter for immunization: Secondary | ICD-10-CM | POA: Diagnosis not present

## 2018-09-25 DIAGNOSIS — R7989 Other specified abnormal findings of blood chemistry: Secondary | ICD-10-CM

## 2018-09-25 DIAGNOSIS — O30042 Twin pregnancy, dichorionic/diamniotic, second trimester: Secondary | ICD-10-CM | POA: Diagnosis not present

## 2018-09-25 DIAGNOSIS — R945 Abnormal results of liver function studies: Secondary | ICD-10-CM | POA: Diagnosis not present

## 2018-09-25 NOTE — Progress Notes (Signed)
Subjective:  Linda Berry is a 23 y.o. G1P0 at 77w0dbeing seen today for ongoing prenatal care.  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  Patient reports general discomforts of pregnancy.  Contractions: Irregular. Vag. Bleeding: None.  Movement: Present. Denies leaking of fluid.   The following portions of the patient's history were reviewed and updated as appropriate: allergies, current medications, past family history, past medical history, past social history, past surgical history and problem list. Problem list updated.  Objective:   Vitals:   09/25/18 1024  BP: 121/81  Pulse: (!) 112  Temp: 97.9 F (36.6 C)  Weight: 171 lb 6.4 oz (77.7 kg)    Fetal Status: Fetal Heart Rate (bpm): 141/142   Movement: Present     General:  Alert, oriented and cooperative. Patient is in no acute distress.  Skin: Skin is warm and dry. No rash noted.   Cardiovascular: Normal heart rate noted  Respiratory: Normal respiratory effort, no problems with respiration noted  Abdomen: Soft, gravid, appropriate for gestational age. Pain/Pressure: Present     Pelvic:  Cervical exam deferred        Extremities: Normal range of motion.  Edema: None  Mental Status: Normal mood and affect. Normal behavior. Normal judgment and thought content.   Urinalysis:      Assessment and Plan:  Pregnancy: G1P0 at 231w0d1. Elevated LFTs  - Comp Met (CMET) - Hepatitis panel, acute  2. Supervision of high risk pregnancy  Stable Glucola today  3. Di/Di twins Stable Growth scan on June 1  Preterm labor symptoms and general obstetric precautions including but not limited to vaginal bleeding, contractions, leaking of  fluid and fetal movement were reviewed in detail with the patient. Please refer to After Visit Summary for other counseling recommendations.  No follow-ups on file.   ErChancy MilroyMD

## 2018-09-25 NOTE — Addendum Note (Signed)
Addended by: Osvaldo Human on: 09/25/2018 11:25 AM   Modules accepted: Orders

## 2018-09-25 NOTE — Patient Instructions (Signed)
Third Trimester of Pregnancy The third trimester is from week 28 through week 40 (months 7 through 9). The third trimester is a time when the unborn baby (fetus) is growing rapidly. At the end of the ninth month, the fetus is about 20 inches in length and weighs 6-10 pounds. Body changes during your third trimester Your body will continue to go through many changes during pregnancy. The changes vary from woman to woman. During the third trimester:  Your weight will continue to increase. You can expect to gain 25-35 pounds (11-16 kg) by the end of the pregnancy.  You may begin to get stretch marks on your hips, abdomen, and breasts.  You may urinate more often because the fetus is moving lower into your pelvis and pressing on your bladder.  You may develop or continue to have heartburn. This is caused by increased hormones that slow down muscles in the digestive tract.  You may develop or continue to have constipation because increased hormones slow digestion and cause the muscles that push waste through your intestines to relax.  You may develop hemorrhoids. These are swollen veins (varicose veins) in the rectum that can itch or be painful.  You may develop swollen, bulging veins (varicose veins) in your legs.  You may have increased body aches in the pelvis, back, or thighs. This is due to weight gain and increased hormones that are relaxing your joints.  You may have changes in your hair. These can include thickening of your hair, rapid growth, and changes in texture. Some women also have hair loss during or after pregnancy, or hair that feels dry or thin. Your hair will most likely return to normal after your baby is born.  Your breasts will continue to grow and they will continue to become tender. A yellow fluid (colostrum) may leak from your breasts. This is the first milk you are producing for your baby.  Your belly button may stick out.  You may notice more swelling in your hands,  face, or ankles.  You may have increased tingling or numbness in your hands, arms, and legs. The skin on your belly may also feel numb.  You may feel short of breath because of your expanding uterus.  You may have more problems sleeping. This can be caused by the size of your belly, increased need to urinate, and an increase in your body's metabolism.  You may notice the fetus "dropping," or moving lower in your abdomen (lightening).  You may have increased vaginal discharge.  You may notice your joints feel loose and you may have pain around your pelvic bone. What to expect at prenatal visits You will have prenatal exams every 2 weeks until week 36. Then you will have weekly prenatal exams. During a routine prenatal visit:  You will be weighed to make sure you and the baby are growing normally.  Your blood pressure will be taken.  Your abdomen will be measured to track your baby's growth.  The fetal heartbeat will be listened to.  Any test results from the previous visit will be discussed.  You may have a cervical check near your due date to see if your cervix has softened or thinned (effaced).  You will be tested for Group B streptococcus. This happens between 35 and 37 weeks. Your health care provider may ask you:  What your birth plan is.  How you are feeling.  If you are feeling the baby move.  If you have had any abnormal   symptoms, such as leaking fluid, bleeding, severe headaches, or abdominal cramping.  If you are using any tobacco products, including cigarettes, chewing tobacco, and electronic cigarettes.  If you have any questions. Other tests or screenings that may be performed during your third trimester include:  Blood tests that check for low iron levels (anemia).  Fetal testing to check the health, activity level, and growth of the fetus. Testing is done if you have certain medical conditions or if there are problems during the pregnancy.  Nonstress test  (NST). This test checks the health of your baby to make sure there are no signs of problems, such as the baby not getting enough oxygen. During this test, a belt is placed around your belly. The baby is made to move, and its heart rate is monitored during movement. What is false labor? False labor is a condition in which you feel small, irregular tightenings of the muscles in the womb (contractions) that usually go away with rest, changing position, or drinking water. These are called Braxton Hicks contractions. Contractions may last for hours, days, or even weeks before true labor sets in. If contractions come at regular intervals, become more frequent, increase in intensity, or become painful, you should see your health care provider. What are the signs of labor?  Abdominal cramps.  Regular contractions that start at 10 minutes apart and become stronger and more frequent with time.  Contractions that start on the top of the uterus and spread down to the lower abdomen and back.  Increased pelvic pressure and dull back pain.  A watery or bloody mucus discharge that comes from the vagina.  Leaking of amniotic fluid. This is also known as your "water breaking." It could be a slow trickle or a gush. Let your health care provider know if it has a color or strange odor. If you have any of these signs, call your health care provider right away, even if it is before your due date. Follow these instructions at home: Medicines  Follow your health care provider's instructions regarding medicine use. Specific medicines may be either safe or unsafe to take during pregnancy.  Take a prenatal vitamin that contains at least 600 micrograms (mcg) of folic acid.  If you develop constipation, try taking a stool softener if your health care provider approves. Eating and drinking   Eat a balanced diet that includes fresh fruits and vegetables, whole grains, good sources of protein such as meat, eggs, or tofu,  and low-fat dairy. Your health care provider will help you determine the amount of weight gain that is right for you.  Avoid raw meat and uncooked cheese. These carry germs that can cause birth defects in the baby.  If you have low calcium intake from food, talk to your health care provider about whether you should take a daily calcium supplement.  Eat four or five small meals rather than three large meals a day.  Limit foods that are high in fat and processed sugars, such as fried and sweet foods.  To prevent constipation: ? Drink enough fluid to keep your urine clear or pale yellow. ? Eat foods that are high in fiber, such as fresh fruits and vegetables, whole grains, and beans. Activity  Exercise only as directed by your health care provider. Most women can continue their usual exercise routine during pregnancy. Try to exercise for 30 minutes at least 5 days a week. Stop exercising if you experience uterine contractions.  Avoid heavy lifting.  Do   not exercise in extreme heat or humidity, or at high altitudes.  Wear low-heel, comfortable shoes.  Practice good posture.  You may continue to have sex unless your health care provider tells you otherwise. Relieving pain and discomfort  Take frequent breaks and rest with your legs elevated if you have leg cramps or low back pain.  Take warm sitz baths to soothe any pain or discomfort caused by hemorrhoids. Use hemorrhoid cream if your health care provider approves.  Wear a good support bra to prevent discomfort from breast tenderness.  If you develop varicose veins: ? Wear support pantyhose or compression stockings as told by your healthcare provider. ? Elevate your feet for 15 minutes, 3-4 times a day. Prenatal care  Write down your questions. Take them to your prenatal visits.  Keep all your prenatal visits as told by your health care provider. This is important. Safety  Wear your seat belt at all times when driving.  Make  a list of emergency phone numbers, including numbers for family, friends, the hospital, and police and fire departments. General instructions  Avoid cat litter boxes and soil used by cats. These carry germs that can cause birth defects in the baby. If you have a cat, ask someone to clean the litter box for you.  Do not travel far distances unless it is absolutely necessary and only with the approval of your health care provider.  Do not use hot tubs, steam rooms, or saunas.  Do not drink alcohol.  Do not use any products that contain nicotine or tobacco, such as cigarettes and e-cigarettes. If you need help quitting, ask your health care provider.  Do not use any medicinal herbs or unprescribed drugs. These chemicals affect the formation and growth of the baby.  Do not douche or use tampons or scented sanitary pads.  Do not cross your legs for long periods of time.  To prepare for the arrival of your baby: ? Take prenatal classes to understand, practice, and ask questions about labor and delivery. ? Make a trial run to the hospital. ? Visit the hospital and tour the maternity area. ? Arrange for maternity or paternity leave through employers. ? Arrange for family and friends to take care of pets while you are in the hospital. ? Purchase a rear-facing car seat and make sure you know how to install it in your car. ? Pack your hospital bag. ? Prepare the baby's nursery. Make sure to remove all pillows and stuffed animals from the baby's crib to prevent suffocation.  Visit your dentist if you have not gone during your pregnancy. Use a soft toothbrush to brush your teeth and be gentle when you floss. Contact a health care provider if:  You are unsure if you are in labor or if your water has broken.  You become dizzy.  You have mild pelvic cramps, pelvic pressure, or nagging pain in your abdominal area.  You have lower back pain.  You have persistent nausea, vomiting, or  diarrhea.  You have an unusual or bad smelling vaginal discharge.  You have pain when you urinate. Get help right away if:  Your water breaks before 37 weeks.  You have regular contractions less than 5 minutes apart before 37 weeks.  You have a fever.  You are leaking fluid from your vagina.  You have spotting or bleeding from your vagina.  You have severe abdominal pain or cramping.  You have rapid weight loss or weight gain.  You have   shortness of breath with chest pain.  You notice sudden or extreme swelling of your face, hands, ankles, feet, or legs.  Your baby makes fewer than 10 movements in 2 hours.  You have severe headaches that do not go away when you take medicine.  You have vision changes. Summary  The third trimester is from week 28 through week 40, months 7 through 9. The third trimester is a time when the unborn baby (fetus) is growing rapidly.  During the third trimester, your discomfort may increase as you and your baby continue to gain weight. You may have abdominal, leg, and back pain, sleeping problems, and an increased need to urinate.  During the third trimester your breasts will keep growing and they will continue to become tender. A yellow fluid (colostrum) may leak from your breasts. This is the first milk you are producing for your baby.  False labor is a condition in which you feel small, irregular tightenings of the muscles in the womb (contractions) that eventually go away. These are called Braxton Hicks contractions. Contractions may last for hours, days, or even weeks before true labor sets in.  Signs of labor can include: abdominal cramps; regular contractions that start at 10 minutes apart and become stronger and more frequent with time; watery or bloody mucus discharge that comes from the vagina; increased pelvic pressure and dull back pain; and leaking of amniotic fluid. This information is not intended to replace advice given to you by your  health care provider. Make sure you discuss any questions you have with your health care provider. Document Released: 04/09/2001 Document Revised: 05/21/2016 Document Reviewed: 05/21/2016 Elsevier Interactive Patient Education  2019 [employer]  

## 2018-09-26 LAB — CBC
Hematocrit: 31.5 % — ABNORMAL LOW (ref 34.0–46.6)
Hemoglobin: 11 g/dL — ABNORMAL LOW (ref 11.1–15.9)
MCH: 31.1 pg (ref 26.6–33.0)
MCHC: 34.9 g/dL (ref 31.5–35.7)
MCV: 89 fL (ref 79–97)
Platelets: 188 10*3/uL (ref 150–450)
RBC: 3.54 x10E6/uL — ABNORMAL LOW (ref 3.77–5.28)
RDW: 11.8 % (ref 11.7–15.4)
WBC: 13.4 10*3/uL — ABNORMAL HIGH (ref 3.4–10.8)

## 2018-09-26 LAB — COMPREHENSIVE METABOLIC PANEL
ALT: 19 IU/L (ref 0–32)
AST: 22 IU/L (ref 0–40)
Albumin/Globulin Ratio: 1.4 (ref 1.2–2.2)
Albumin: 3.9 g/dL (ref 3.9–5.0)
Alkaline Phosphatase: 86 IU/L (ref 39–117)
BUN/Creatinine Ratio: 12 (ref 9–23)
BUN: 6 mg/dL (ref 6–20)
Bilirubin Total: <0.2 mg/dL (ref 0.0–1.2)
CO2: 15 mmol/L — ABNORMAL LOW (ref 20–29)
Calcium: 9.7 mg/dL (ref 8.7–10.2)
Chloride: 100 mmol/L (ref 96–106)
Creatinine, Ser: 0.5 mg/dL — ABNORMAL LOW (ref 0.57–1.00)
GFR calc Af Amer: 157 mL/min/{1.73_m2} (ref >59)
GFR calc non Af Amer: 136 mL/min/{1.73_m2} (ref >59)
Globulin, Total: 2.8 g/dL (ref 1.5–4.5)
Glucose: 133 mg/dL — ABNORMAL HIGH (ref 65–99)
Potassium: 3.6 mmol/L (ref 3.5–5.2)
Sodium: 134 mmol/L (ref 134–144)
Total Protein: 6.7 g/dL (ref 6.0–8.5)

## 2018-09-26 LAB — HEPATITIS PANEL, ACUTE
Hep A IgM: NEGATIVE
Hep B C IgM: NEGATIVE
Hep C Virus Ab: <0.1 s/co ratio (ref 0.0–0.9)
Hepatitis B Surface Ag: NEGATIVE

## 2018-09-26 LAB — HIV ANTIBODY (ROUTINE TESTING W REFLEX): HIV Screen 4th Generation wRfx: NONREACTIVE

## 2018-09-26 LAB — RPR: RPR Ser Ql: NONREACTIVE

## 2018-09-26 LAB — GLUCOSE TOLERANCE, 2 HOURS W/ 1HR
Glucose, 1 hour: 135 mg/dL (ref 65–179)
Glucose, 2 hour: 123 mg/dL (ref 65–152)
Glucose, Fasting: 74 mg/dL (ref 65–91)

## 2018-09-28 ENCOUNTER — Ambulatory Visit (HOSPITAL_COMMUNITY): Payer: Medicaid Other | Admitting: *Deleted

## 2018-09-28 ENCOUNTER — Ambulatory Visit (HOSPITAL_COMMUNITY)
Admission: RE | Admit: 2018-09-28 | Discharge: 2018-09-28 | Disposition: A | Discharge status: Home / Self Care | Payer: Medicaid Other | Source: Ambulatory Visit | Attending: Obstetrics and Gynecology | Admitting: Obstetrics and Gynecology

## 2018-09-28 ENCOUNTER — Other Ambulatory Visit: Payer: Self-pay

## 2018-09-28 ENCOUNTER — Encounter (HOSPITAL_COMMUNITY): Payer: Self-pay

## 2018-09-28 ENCOUNTER — Other Ambulatory Visit (HOSPITAL_COMMUNITY): Payer: Self-pay | Admitting: *Deleted

## 2018-09-28 VITALS — BP 126/74 | HR 113 | Temp 98.7°F

## 2018-09-28 DIAGNOSIS — O30042 Twin pregnancy, dichorionic/diamniotic, second trimester: Secondary | ICD-10-CM | POA: Diagnosis not present

## 2018-09-28 DIAGNOSIS — Z3A26 26 weeks gestation of pregnancy: Secondary | ICD-10-CM

## 2018-09-28 DIAGNOSIS — O0990 Supervision of high risk pregnancy, unspecified, unspecified trimester: Secondary | ICD-10-CM | POA: Insufficient documentation

## 2018-09-28 DIAGNOSIS — Z362 Encounter for other antenatal screening follow-up: Secondary | ICD-10-CM | POA: Diagnosis not present

## 2018-09-28 DIAGNOSIS — O30049 Twin pregnancy, dichorionic/diamniotic, unspecified trimester: Secondary | ICD-10-CM

## 2018-10-06 ENCOUNTER — Telehealth: Payer: Self-pay | Admitting: Family Medicine

## 2018-10-06 NOTE — Telephone Encounter (Signed)
Patient aware of mychart visit

## 2018-10-07 ENCOUNTER — Telehealth: Payer: Self-pay | Admitting: Obstetrics & Gynecology

## 2018-10-07 NOTE — Telephone Encounter (Signed)
Patient was called, and given help downloading her MyChart app. Information about this visit was discussed with patient. °

## 2018-10-08 ENCOUNTER — Telehealth (INDEPENDENT_AMBULATORY_CARE_PROVIDER_SITE_OTHER): Payer: Medicaid Other | Admitting: Obstetrics & Gynecology

## 2018-10-08 ENCOUNTER — Other Ambulatory Visit: Payer: Self-pay

## 2018-10-08 VITALS — BP 114/81 | HR 97 | Wt 178.0 lb

## 2018-10-08 DIAGNOSIS — Z3A27 27 weeks gestation of pregnancy: Secondary | ICD-10-CM

## 2018-10-08 DIAGNOSIS — O30042 Twin pregnancy, dichorionic/diamniotic, second trimester: Secondary | ICD-10-CM

## 2018-10-08 DIAGNOSIS — O99519 Diseases of the respiratory system complicating pregnancy, unspecified trimester: Secondary | ICD-10-CM

## 2018-10-08 DIAGNOSIS — O0990 Supervision of high risk pregnancy, unspecified, unspecified trimester: Secondary | ICD-10-CM

## 2018-10-08 DIAGNOSIS — J45909 Unspecified asthma, uncomplicated: Secondary | ICD-10-CM

## 2018-10-08 MED ORDER — ALBUTEROL SULFATE HFA 108 (90 BASE) MCG/ACT IN AERS: 2.0000 | INHALATION_SPRAY | Freq: Four times a day (QID) | RESPIRATORY_TRACT | 12 refills | Status: DC | PRN

## 2018-10-08 NOTE — Progress Notes (Signed)
I connected with  Linda Berry on 10/08/18 at  8:55 AM EDT by telephone and verified that I am speaking with the correct person using two identifiers.   I discussed the limitations, risks, security and privacy concerns of performing an evaluation and management service by telephone and the availability of in person appointments. I also discussed with the patient that there may be a patient responsible charge related to this service. The patient expressed understanding and agreed to proceed.  Dolores Hoose, RN 10/08/2018  8:43 AM

## 2018-10-08 NOTE — Progress Notes (Signed)
TELEHEALTH OBSTETRICS PRENATAL VIRTUAL VIDEO VISIT ENCOUNTER NOTE  Provider location: [facility] [employer] at [facility]   I connected with [name] on 10/08/18 at  8:55 AM EDT by MyChart Video Encounter at home and verified that I am speaking with the correct person using two identifiers.   I discussed the limitations, risks, security and privacy concerns of performing an evaluation and management service by telephone and the availability of in person appointments. I also discussed with the patient that there may be a patient responsible charge related to this service. The patient expressed understanding and agreed to proceed. Subjective:  Linda Berry is a 23 y.o. G1P0 at [redacted]w[redacted]d being seen today for ongoing prenatal care.  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  Patient reports no complaints.  Contractions: Not present. Vag. Bleeding: None.  Movement: Present. Denies any leaking of fluid.   The following portions of the patient's history were reviewed and updated as appropriate: allergies, current medications, past family history, past medical history, past social history, past surgical history and problem list.   Objective:   Vitals:   10/08/18 0845  BP: 114/81  Pulse: 97    Fetal Status:     Movement: Present     General:  Alert, oriented and cooperative. Patient is in no acute distress.  Respiratory: Normal respiratory effort, no problems with respiration noted  Mental Status: Normal mood and affect. Normal behavior. Normal judgment and thought content.  Rest of physical exam deferred due to type of encounter  Imaging: [place] Mfm Ob Follow Up  Result Date:  09/29/2018 ----------------------------------------------------------------------  OBSTETRICS REPORT                        (Signed Final 09/29/2018 07:09 am) ---------------------------------------------------------------------- Patient Info  ID #:       [id number]                          D.O.B.:  October 02, 1995 (23 yrs)  Name:       Linda Berry               Visit Date: 09/28/2018 01:03 pm ---------------------------------------------------------------------- Performed By  Performed By:     Rennie Plowman          Ref. Address:      [address]                    [place]                                                              [address]  Attending:        Lin Landsman      [facility]  Referred By:      [facility] ---------------------------------------------------------------------- Orders   #  Description                          Code         Ordered By   1  [place] MFM OB FOLLOW UP                  76816.01     RAVI SHANKAR   2  [place] MFM OB FOLLOW UP ADDL             16109.60     RAVI SHANKAR      GEST  ----------------------------------------------------------------------   #  Order #                    Accession #                 Episode #   1  [id number]                  [id number]   2  [id number]                  [id number]  ---------------------------------------------------------------------- Indications   [redacted] weeks gestation of pregnancy                Z3A.26   Encounter for antenatal screening for          Z36.3   malformations   Twin pregnancy, di/di, second trimester        O30.042  ---------------------------------------------------------------------- Vital Signs  Weight (lb): 171                               Height:        5'1"  BMI:         32.31 ---------------------------------------------------------------------- Fetal Evaluation (Fetus A)  Num Of  Fetuses:          2  Fetal Heart Rate(bpm):   130  Cardiac Activity:        Observed  Fetal Lie:               Presenting Fetus  Presentation:            Breech  Placenta:                Posterior  P. Cord Insertion:       Not well visualized  Membrane Desc:      Dividing Membrane seen - Dichorionic.  Amniotic Fluid  AFI FV:      Within normal limits                              Largest Pocket(cm)                              4.4 ---------------------------------------------------------------------- Biometry (Fetus A)  BPD:      63.9  mm     G. Age:  25w 6d         21  %    CI:  72.13   %    70 - 86                                                          FL/HC:       19.8  %    18.6 - 20.4  HC:      239.4  mm     G. Age:  26w 0d         14  %    HC/AC:       1.13       1.04 - 1.22  AC:      212.5  mm     G. Age:  25w 6d         23  %    FL/BPD:      74.2  %    71 - 87  FL:       47.4  mm     G. Age:  25w 6d         20  %    FL/AC:       22.3  %    20 - 24  HUM:      43.8  mm     G. Age:  26w 0d         38  %  CER:      27.3  mm     G. Age:  24w 5d         16  %  LV:        2.7  mm  CM:        5.5  mm  Est. FW:     856   gm   1 lb 14 oz      40  %     FW Discordancy         1  % ---------------------------------------------------------------------- OB History  Gravidity:    1         Term:   0        Prem:   0        SAB:   0  TOP:          0       Ectopic:  0        Living: 0 ---------------------------------------------------------------------- Gestational Age (Fetus A)  LMP:           26w 3d        Date:  03/27/18                 EDD:   01/01/19  U/S Today:     25w 6d                                        EDD:   01/05/19  Best:          26w 3d     Det. By:  LMP  (03/27/18)          EDD:   01/01/19 ---------------------------------------------------------------------- Anatomy (Fetus A)  Cranium:               Appears normal         LVOT:  Previously seen  Cavum:                 Appears  normal         Aortic Arch:            Previously seen  Ventricles:            Appears normal         Ductal Arch:            Appears normal  Choroid Plexus:        Previously seen        Diaphragm:              Appears normal  Cerebellum:            Appears normal         Stomach:                Appears normal, left                                                                        sided  Posterior Fossa:       Appears normal         Abdomen:                Appears normal  Nuchal Fold:           Not applicable (>17    Abdominal Wall:         Appears nml (cord                         wks GA)                                        insert, abd wall)  Face:                  Appears normal         Cord Vessels:           Appears normal (3                         (orbits and profile)                           vessel cord)  Lips:                  Previously seen        Kidneys:                Appear normal  Palate:                Previously seen        Bladder:                Appears normal  Thoracic:              Appears normal         Spine:  Previously seen  Heart:                 Previously seen        Upper Extremities:      Previously seen  RVOT:                  Previously seen        Lower Extremities:      Previously seen  Other:  Heels previously visualized. Normal genaitalia. Female gender ---------------------------------------------------------------------- Fetal Evaluation (Fetus B)  Num Of Fetuses:          2  Fetal Heart Rate(bpm):   140  Cardiac Activity:        Observed  Fetal Lie:               Upper Fetus  Presentation:            Transverse, head to maternal left  Placenta:                Anterior  P. Cord Insertion:       Previously Visualized  Membrane Desc:      Dividing Membrane seen - Dichorionic.  Amniotic Fluid  AFI FV:      Within normal limits                              Largest Pocket(cm)                              3.23  ---------------------------------------------------------------------- Biometry (Fetus B)  BPD:        60  mm     G. Age:  24w 4d          2  %    CI:        69.21   %    70 - 86                                                          FL/HC:       20.5  %    18.6 - 20.4  HC:      230.3  mm     G. Age:  25w 0d        < 3  %    HC/AC:       1.05       1.04 - 1.22  AC:      219.4  mm     G. Age:  26w 3d         41  %    FL/BPD:      78.5  %    71 - 87  FL:       47.1  mm     G. Age:  25w 5d         18  %    FL/AC:       21.5  %    20 - 24  HUM:        43  mm     G. Age:  25w 5d         29  %  CER:      27.3  mm  G. Age:  24w 5d         16  %  LV:        3.6  mm  CM:        6.1  mm  Est. FW:     867   gm   1 lb 15 oz      42  %     FW Discordancy      0 \ 1 % ---------------------------------------------------------------------- Gestational Age (Fetus B)  LMP:           26w 3d        Date:  03/27/18                 EDD:   01/01/19  U/S Today:     25w 3d                                        EDD:   01/08/19  Best:          26w 3d     Det. By:  LMP  (03/27/18)          EDD:   01/01/19 ---------------------------------------------------------------------- Anatomy (Fetus B)  Cranium:               Appears normal         LVOT:                   Appears normal  Cavum:                 Appears normal         Aortic Arch:            Previously seen  Ventricles:            Appears normal         Ductal Arch:            Previously seen  Choroid Plexus:        Appears normal         Diaphragm:              Appears normal  Cerebellum:            Appears normal         Stomach:                Appears normal, left                                                                        sided  Posterior Fossa:       Appears normal         Abdomen:                Appears normal  Nuchal Fold:           Not applicable (>20    Abdominal Wall:         Appears nml (cord                         wks GA)  insert, abd wall)  Face:                  Orbits and profile     Cord Vessels:           Appears normal (3                         previously seen                                vessel cord)  Lips:                  Previously seen        Kidneys:                Appear normal  Palate:                Not well visualized    Bladder:                Appears normal  Thoracic:              Appears normal         Spine:                  Previously seen  Heart:                 Previously seen        Upper Extremities:      Previously seen  RVOT:                  Not well visualized    Lower Extremities:      Previously seen  Other:  Heels visualized. ---------------------------------------------------------------------- Cervix Uterus Adnexa  Left Ovary  Not visualized.  Right Ovary  Not visualized. ---------------------------------------------------------------------- Impression  Dichorionic-diamniotic twin pregnancy with appropriate  interval growth and no significant discordancy.  Twin A: Lower fetus, breech presentation, posterior placenta.  PCI again not visualized. Ductal arch was well documented.  Amniotic fluid is normal and good fetal activity is seen.  Twin B: Upper fetus, transverse lie and head to maternal left.  Palate and RVOT again not well visualized. LVOT is well  documented on today's exam. Amniotic fluid is normal and  good fetal activity is seen. ---------------------------------------------------------------------- Recommendations  Follow up scheduled in 4 weeks for interval growth. ----------------------------------------------------------------------               Lin Landsman, MD Electronically Signed Final Report   09/29/2018 07:09 am ----------------------------------------------------------------------  [place] Mfm Ob Follow Up Addl Gest  Result Date: 09/29/2018 ----------------------------------------------------------------------  OBSTETRICS REPORT                        (Signed Final  09/29/2018 07:09 am) ---------------------------------------------------------------------- Patient Info  ID #:       [id number]                          D.O.B.:  Feb 11, 1996 (23 yrs)  Name:       Linda Berry               Visit Date: 09/28/2018 01:03 pm ---------------------------------------------------------------------- Performed By  Performed By:     Rennie Plowman          Ref. Address:      [address]  [place]                                                              [address]  Attending:        Lin Landsman      [facility]                                        [facility]  Referred By:      [facility] ---------------------------------------------------------------------- Orders   #  Description                          Code         Ordered By   1  [place] MFM OB FOLLOW UP                  76816.01     RAVI SHANKAR   2  [place] MFM OB FOLLOW UP ADDL             [id number]     RAVI SHANKAR      GEST  ----------------------------------------------------------------------   #  Order #                    Accession #                 Episode #   1  [id number]                  [id number]                  [id number]   2  [id number]                  [id number]                  [id number]  ---------------------------------------------------------------------- Indications   [redacted] weeks gestation of pregnancy                Z3A.26   Encounter for antenatal screening for          Z36.3   malformations   Twin pregnancy, di/di, second trimester        O30.042  ---------------------------------------------------------------------- Vital Signs  Weight (lb): 171                               Height:        5'1"  BMI:         32.31 ---------------------------------------------------------------------- Fetal Evaluation (Fetus A)  Num Of Fetuses:          2  Fetal Heart Rate(bpm):   130  Cardiac Activity:        Observed  Fetal Lie:               Presenting Fetus   Presentation:            Breech  Placenta:  Posterior  P. Cord Insertion:       Not well visualized  Membrane Desc:      Dividing Membrane seen - Dichorionic.  Amniotic Fluid  AFI FV:      Within normal limits                              Largest Pocket(cm)                              4.4 ---------------------------------------------------------------------- Biometry (Fetus A)  BPD:      63.9  mm     G. Age:  25w 6d         21  %    CI:        72.13   %    70 - 86                                                          FL/HC:       19.8  %    18.6 - 20.4  HC:      239.4  mm     G. Age:  26w 0d         14  %    HC/AC:       1.13       1.04 - 1.22  AC:      212.5  mm     G. Age:  25w 6d         23  %    FL/BPD:      74.2  %    71 - 87  FL:       47.4  mm     G. Age:  25w 6d         20  %    FL/AC:       22.3  %    20 - 24  HUM:      43.8  mm     G. Age:  26w 0d         38  %  CER:      27.3  mm     G. Age:  24w 5d         16  %  LV:        2.7  mm  CM:        5.5  mm  Est. FW:     856   gm   1 lb 14 oz      40  %     FW Discordancy         1  % ---------------------------------------------------------------------- OB History  Gravidity:    1         Term:   0        Prem:   0        SAB:   0  TOP:          0       Ectopic:  0        Living: 0 ---------------------------------------------------------------------- Gestational Age (Fetus A)  LMP:           26w 3d  Date:  03/27/18                 EDD:   01/01/19  U/S Today:     25w 6d                                        EDD:   01/05/19  Best:          26w 3d     Det. By:  LMP  (03/27/18)          EDD:   01/01/19 ---------------------------------------------------------------------- Anatomy (Fetus A)  Cranium:               Appears normal         LVOT:                   Previously seen  Cavum:                 Appears normal         Aortic Arch:            Previously seen  Ventricles:            Appears normal         Ductal Arch:            Appears  normal  Choroid Plexus:        Previously seen        Diaphragm:              Appears normal  Cerebellum:            Appears normal         Stomach:                Appears normal, left                                                                        sided  Posterior Fossa:       Appears normal         Abdomen:                Appears normal  Nuchal Fold:           Not applicable (>20    Abdominal Wall:         Appears nml (cord                         wks GA)                                        insert, abd wall)  Face:                  Appears normal         Cord Vessels:           Appears normal (3                         (orbits and  profile)                           vessel cord)  Lips:                  Previously seen        Kidneys:                Appear normal  Palate:                Previously seen        Bladder:                Appears normal  Thoracic:              Appears normal         Spine:                  Previously seen  Heart:                 Previously seen        Upper Extremities:      Previously seen  RVOT:                  Previously seen        Lower Extremities:      Previously seen  Other:  Heels previously visualized. Normal genaitalia. Female gender ---------------------------------------------------------------------- Fetal Evaluation (Fetus B)  Num Of Fetuses:          2  Fetal Heart Rate(bpm):   140  Cardiac Activity:        Observed  Fetal Lie:               Upper Fetus  Presentation:            Transverse, head to maternal left  Placenta:                Anterior  P. Cord Insertion:       Previously Visualized  Membrane Desc:      Dividing Membrane seen - Dichorionic.  Amniotic Fluid  AFI FV:      Within normal limits                              Largest Pocket(cm)                              3.23 ---------------------------------------------------------------------- Biometry (Fetus B)  BPD:        60  mm     G. Age:  24w 4d          2  %    CI:        69.21   %    70 - 86                                                           FL/HC:       20.5  %    18.6 - 20.4  HC:      230.3  mm     G. Age:  25w 0d        < 3  %  HC/AC:       1.05       1.04 - 1.22  AC:      219.4  mm     G. Age:  26w 3d         41  %    FL/BPD:      78.5  %    71 - 87  FL:       47.1  mm     G. Age:  25w 5d         18  %    FL/AC:       21.5  %    20 - 24  HUM:        43  mm     G. Age:  25w 5d         29  %  CER:      27.3  mm     G. Age:  24w 5d         16  %  LV:        3.6  mm  CM:        6.1  mm  Est. FW:     867   gm   1 lb 15 oz      42  %     FW Discordancy      0 \ 1 % ---------------------------------------------------------------------- Gestational Age (Fetus B)  LMP:           26w 3d        Date:  03/27/18                 EDD:   01/01/19  U/S Today:     25w 3d                                        EDD:   01/08/19  Best:          26w 3d     Det. By:  LMP  (03/27/18)          EDD:   01/01/19 ---------------------------------------------------------------------- Anatomy (Fetus B)  Cranium:               Appears normal         LVOT:                   Appears normal  Cavum:                 Appears normal         Aortic Arch:            Previously seen  Ventricles:            Appears normal         Ductal Arch:            Previously seen  Choroid Plexus:        Appears normal         Diaphragm:              Appears normal  Cerebellum:            Appears normal         Stomach:                Appears normal, left  sided  Posterior Fossa:       Appears normal         Abdomen:                Appears normal  Nuchal Fold:           Not applicable (>20    Abdominal Wall:         Appears nml (cord                         wks GA)                                        insert, abd wall)  Face:                  Orbits and profile     Cord Vessels:           Appears normal (3                         previously seen                                 vessel cord)  Lips:                  Previously seen        Kidneys:                Appear normal  Palate:                Not well visualized    Bladder:                Appears normal  Thoracic:              Appears normal         Spine:                  Previously seen  Heart:                 Previously seen        Upper Extremities:      Previously seen  RVOT:                  Not well visualized    Lower Extremities:      Previously seen  Other:  Heels visualized. ---------------------------------------------------------------------- Cervix Uterus Adnexa  Left Ovary  Not visualized.  Right Ovary  Not visualized. ---------------------------------------------------------------------- Impression  Dichorionic-diamniotic twin pregnancy with appropriate  interval growth and no significant discordancy.  Twin A: Lower fetus, breech presentation, posterior placenta.  PCI again not visualized. Ductal arch was well documented.  Amniotic fluid is normal and good fetal activity is seen.  Twin B: Upper fetus, transverse lie and head to maternal left.  Palate and RVOT again not well visualized. LVOT is well  documented on today's exam. Amniotic fluid is normal and  good fetal activity is seen. ---------------------------------------------------------------------- Recommendations  Follow up scheduled in 4 weeks for interval growth. ----------------------------------------------------------------------               Lin Landsman, MD Electronically Signed Final Report   09/29/2018 07:09 am ----------------------------------------------------------------------   Assessment and Plan:  Pregnancy: G1P0 at [redacted]w[redacted]d 1. Dichorionic diamniotic twin  pregnancy in second trimester - q 4 week MFM u/s  2. Asthma affecting pregnancy, antepartum - proventil inhaler prn  3. Supervision of high risk pregnancy, antepartum   Preterm labor symptoms and general obstetric precautions including but not limited to vaginal bleeding,  contractions, leaking of fluid and fetal movement were reviewed in detail with the patient. I discussed the assessment and treatment plan with the patient. The patient was provided an opportunity to ask questions and all were answered. The patient agreed with the plan and demonstrated an understanding of the instructions. The patient was advised to call back or seek an in-person office evaluation/go to [facility] at [facility] for any urgent or concerning symptoms. Please refer to After Visit Summary for other counseling recommendations.   I provided 10 minutes of face-to-face time during this encounter.  No follow-ups on file.  Future Appointments  Date Time Provider Department Center  10/26/2018  1:00 PM [facility] NURSE [facility]  10/26/2018  1:00 PM [facility] [place] 3 WH-M[facility]US [facility]-US    Allie Bossier, MD [facility] [employer], [facility]

## 2018-10-20 ENCOUNTER — Telehealth (INDEPENDENT_AMBULATORY_CARE_PROVIDER_SITE_OTHER): Payer: Medicaid Other | Admitting: *Deleted

## 2018-10-20 DIAGNOSIS — O0990 Supervision of high risk pregnancy, unspecified, unspecified trimester: Secondary | ICD-10-CM

## 2018-10-20 NOTE — Telephone Encounter (Signed)
Called pt regarding babyscripts.  Per review, pt has not logged a BP into the app since 5/13.  Pt states that she got a new phone and had not yet downloaded the app but she has been checking her blood pressures.  Recommended pt download the app again and enter her blood pressures.  Importance of this reviewed with pt.  Pt stated that we never recorded her blood pressures into the app whenever she came for appointments.  Advised pt that was not something we could do but did encourage her to log her blood pressures from her visits into the app.  Pt verbalized understanding.

## 2018-10-26 ENCOUNTER — Other Ambulatory Visit (HOSPITAL_COMMUNITY): Payer: Self-pay | Admitting: Maternal & Fetal Medicine

## 2018-10-26 ENCOUNTER — Ambulatory Visit (HOSPITAL_COMMUNITY): Payer: Medicaid Other | Admitting: *Deleted

## 2018-10-26 ENCOUNTER — Encounter (HOSPITAL_COMMUNITY): Payer: Self-pay

## 2018-10-26 ENCOUNTER — Ambulatory Visit (HOSPITAL_COMMUNITY): Payer: Medicaid Other

## 2018-10-26 ENCOUNTER — Other Ambulatory Visit: Payer: Self-pay

## 2018-10-26 ENCOUNTER — Ambulatory Visit (HOSPITAL_COMMUNITY)
Admission: RE | Admit: 2018-10-26 | Discharge: 2018-10-26 | Disposition: A | Discharge status: Home / Self Care | Payer: Medicaid Other | Source: Ambulatory Visit | Attending: Maternal & Fetal Medicine | Admitting: Maternal & Fetal Medicine

## 2018-10-26 VITALS — BP 144/90 | HR 106 | Temp 98.9°F

## 2018-10-26 DIAGNOSIS — O0990 Supervision of high risk pregnancy, unspecified, unspecified trimester: Secondary | ICD-10-CM | POA: Diagnosis present

## 2018-10-26 DIAGNOSIS — O365930 Maternal care for other known or suspected poor fetal growth, third trimester, not applicable or unspecified: Secondary | ICD-10-CM | POA: Diagnosis not present

## 2018-10-26 DIAGNOSIS — O30049 Twin pregnancy, dichorionic/diamniotic, unspecified trimester: Secondary | ICD-10-CM | POA: Diagnosis not present

## 2018-10-26 DIAGNOSIS — Z362 Encounter for other antenatal screening follow-up: Secondary | ICD-10-CM | POA: Diagnosis not present

## 2018-10-26 DIAGNOSIS — O30042 Twin pregnancy, dichorionic/diamniotic, second trimester: Secondary | ICD-10-CM

## 2018-10-26 DIAGNOSIS — O30043 Twin pregnancy, dichorionic/diamniotic, third trimester: Secondary | ICD-10-CM

## 2018-10-26 DIAGNOSIS — Z3A30 30 weeks gestation of pregnancy: Secondary | ICD-10-CM | POA: Diagnosis not present

## 2018-10-26 MED ORDER — BREAST PUMP MISC: 1.0000 | 0 refills | Status: DC | PRN

## 2018-10-27 ENCOUNTER — Other Ambulatory Visit (HOSPITAL_COMMUNITY): Payer: Self-pay | Admitting: *Deleted

## 2018-10-27 DIAGNOSIS — O30049 Twin pregnancy, dichorionic/diamniotic, unspecified trimester: Secondary | ICD-10-CM

## 2018-10-27 DIAGNOSIS — O365990 Maternal care for other known or suspected poor fetal growth, unspecified trimester, not applicable or unspecified: Secondary | ICD-10-CM

## 2018-10-27 NOTE — Progress Notes (Unsigned)
us

## 2018-10-29 ENCOUNTER — Other Ambulatory Visit: Payer: Self-pay

## 2018-10-29 ENCOUNTER — Encounter: Payer: Self-pay | Admitting: Obstetrics and Gynecology

## 2018-10-29 ENCOUNTER — Telehealth (INDEPENDENT_AMBULATORY_CARE_PROVIDER_SITE_OTHER): Payer: Medicaid Other | Admitting: Obstetrics and Gynecology

## 2018-10-29 DIAGNOSIS — R945 Abnormal results of liver function studies: Secondary | ICD-10-CM

## 2018-10-29 DIAGNOSIS — Z3A30 30 weeks gestation of pregnancy: Secondary | ICD-10-CM

## 2018-10-29 DIAGNOSIS — J45909 Unspecified asthma, uncomplicated: Secondary | ICD-10-CM

## 2018-10-29 DIAGNOSIS — O99519 Diseases of the respiratory system complicating pregnancy, unspecified trimester: Secondary | ICD-10-CM

## 2018-10-29 DIAGNOSIS — M419 Scoliosis, unspecified: Secondary | ICD-10-CM

## 2018-10-29 DIAGNOSIS — O365990 Maternal care for other known or suspected poor fetal growth, unspecified trimester, not applicable or unspecified: Secondary | ICD-10-CM

## 2018-10-29 DIAGNOSIS — O30042 Twin pregnancy, dichorionic/diamniotic, second trimester: Secondary | ICD-10-CM

## 2018-10-29 DIAGNOSIS — O0993 Supervision of high risk pregnancy, unspecified, third trimester: Secondary | ICD-10-CM

## 2018-10-29 DIAGNOSIS — O30043 Twin pregnancy, dichorionic/diamniotic, third trimester: Secondary | ICD-10-CM

## 2018-10-29 DIAGNOSIS — O365932 Maternal care for other known or suspected poor fetal growth, third trimester, fetus 2: Secondary | ICD-10-CM

## 2018-10-29 DIAGNOSIS — O99513 Diseases of the respiratory system complicating pregnancy, third trimester: Secondary | ICD-10-CM

## 2018-10-29 DIAGNOSIS — O0990 Supervision of high risk pregnancy, unspecified, unspecified trimester: Secondary | ICD-10-CM

## 2018-10-29 DIAGNOSIS — O365933 Maternal care for other known or suspected poor fetal growth, third trimester, fetus 3: Secondary | ICD-10-CM

## 2018-10-29 DIAGNOSIS — O26893 Other specified pregnancy related conditions, third trimester: Secondary | ICD-10-CM

## 2018-10-29 DIAGNOSIS — R7989 Other specified abnormal findings of blood chemistry: Secondary | ICD-10-CM

## 2018-10-29 HISTORY — DX: Maternal care for other known or suspected poor fetal growth, unspecified trimester, not applicable or unspecified: O36.5990

## 2018-10-29 NOTE — Progress Notes (Signed)
TELEHEALTH OBSTETRICS PRENATAL VIRTUAL VIDEO VISIT ENCOUNTER NOTE  Provider location: [facility] [employer] [facility]   I connected with [name] on 10/29/18 at 10:55 AM EDT by MyChart Video Encounter at home and verified that I am speaking with the correct person using two identifiers.   I discussed the limitations, risks, security and privacy concerns of performing an evaluation and management service by telephone and the availability of in person appointments. I also discussed with the patient that there may be a patient responsible charge related to this service. The patient expressed understanding and agreed to proceed. Subjective:  Linda Berry is a 23 y.o. G1P0 at [redacted]w[redacted]d being seen today for ongoing prenatal care.  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  Patient reports no complaints.  Contractions: Not present. Vag. Bleeding: None.  Movement: Present. Denies any leaking of fluid.   The following portions of the patient's history were reviewed and updated as appropriate: allergies, current medications, past family history, past medical history, past social history, past surgical history and problem list.   Objective:   Vitals:   10/29/18 1101  BP: 122/80  Pulse: (!) 102    Fetal Status:     Movement: Present     General:  Alert, oriented and cooperative. Patient is in no acute distress.  Respiratory: Normal respiratory effort, no problems with respiration noted  Mental Status: Normal mood and affect. Normal behavior. Normal judgment and thought content.  Rest of physical exam deferred due to type of  encounter  Imaging: [place] Mfm Ob Follow Up  Result Date: 10/26/2018 ----------------------------------------------------------------------  OBSTETRICS REPORT                       (Signed Final 10/26/2018 05:14 pm) ---------------------------------------------------------------------- Patient Info  ID #:       [id number]                          D.O.B.:  Sep 15, 1995 (23 yrs)  Name:       Linda Berry               Visit Date: 10/26/2018 12:54 pm ---------------------------------------------------------------------- Performed By  Performed By:     Sandi Mealy        Ref. Address:     [address]                    [place]                                                             [address]  Attending:        Noralee Space MD        [facility]  Referred By:      [facility] ---------------------------------------------------------------------- Orders   #  Description                          Code         Ordered By   1  [place] MFM OB FOLLOW UP                  16109.60     Lin Landsman   2  [place] MFM OB FOLLOW UP ADDL             45409.81     CORENTHIAN      GEST                                              BOOKER   3  [place] MFM UA CORD DOPPLER               76820.02     CORENTHIAN                                                        BOOKER   4  [place] MFM UA ADDL GEST                  19147.82     Lin Landsman  ----------------------------------------------------------------------   #  Order #                    Accession #                 Episode #   1  [id number]                  [id number]   2  [id number]                  [id number]                  [id number]   3  [id number]                  [id number]                  [id number]   4  [id number]                  [id number]  [id number]  ---------------------------------------------------------------------- Indications   Encounter for other antenatal screening        Z36.2   follow-up ( Low Risk NIPS)   Twin pregnancy, di/di, third trimester         O30.043   [redacted] weeks gestation of pregnancy                Z3A.30   Maternal care for known or suspected poor      O36.5930   fetal growth, third trimester, not applicable or   unspecified  ---------------------------------------------------------------------- Vital Signs  Weight (lb): 178                               Height:        5'0"  BMI:         34.76  BP:          134/81 ---------------------------------------------------------------------- Fetal Evaluation (Fetus A)  Num Of Fetuses:         2  Fetal Heart Rate(bpm):  151  Cardiac Activity:       Observed  Presentation:           Cephalic  Placenta:               Posterior  P. Cord Insertion:      Previously Visualized  Amniotic Fluid  AFI FV:      Within normal limits  AFI Sum(cm)     %Tile       Largest Pocket(cm)  11.85           29          6.13  RUQ(cm)       RLQ(cm)       LUQ(cm)        LLQ(cm)  2.28          0.03          6.13           3.41 ---------------------------------------------------------------------- Biometry (Fetus A)  BPD:      67.6  mm     G. Age:  27w 2d        < 1  %    CI:        67.74   %    70 - 86                                                          FL/HC:      21.7   %    19.2 - 21.4  HC:      262.8  mm     G. Age:  28w 4d        < 1  %    HC/AC:      1.06        0.99 - 1.21  AC:      247.8  mm     G. Age:  29w 0d         11  %    FL/BPD:     84.2   %    71 - 87  FL:       56.9  mm     G. Age:  29w 6d  21  %    FL/AC:      23.0   %    20 - 24  HUM:      49.9  mm     G. Age:  29w 2d         28  %  Est. FW:    1338  gm    2 lb 15 oz       7  %     FW Discordancy         2  % ---------------------------------------------------------------------- OB History  Gravidity:    1         Term:   0         Prem:   0        SAB:   0  TOP:          0       Ectopic:  0        Living: 0 ---------------------------------------------------------------------- Gestational Age (Fetus A)  LMP:           30w 3d        Date:  03/27/18                 EDD:   01/01/19  U/S Today:     28w 5d                                        EDD:   01/13/19  Best:          30w 3d     Det. By:  LMP  (03/27/18)          EDD:   01/01/19 ---------------------------------------------------------------------- Anatomy (Fetus A)  Cranium:               Appears normal         Aortic Arch:            Previously seen  Cavum:                 Appears normal         Ductal Arch:            Previously seen  Ventricles:            Previously seen        Diaphragm:              Appears normal  Choroid Plexus:        Previously seen        Stomach:                Appears normal, left                                                                        sided  Cerebellum:            Previously seen        Abdomen:                Appears normal  Posterior Fossa:       Previously seen        Abdominal  Wall:         Previously seen  Nuchal Fold:           Previously seen        Cord Vessels:           Not well visualized  Face:                  Appears normal         Kidneys:                Appear normal                         (orbits and profile)  Lips:                  Previously seen        Bladder:                Appears normal  Thoracic:              Previously seen        Spine:                  Previously seen  Heart:                 Previously seen        Upper Extremities:      Previously seen  RVOT:                  Previously seen        Lower Extremities:      Previously seen  LVOT:                  Previously seen  Other:  Female gender ---------------------------------------------------------------------- Doppler - Fetal Vessels (Fetus A)  Umbilical Artery   S/D     %tile  2.44       27  ---------------------------------------------------------------------- Fetal Evaluation (Fetus B)  Num Of Fetuses:         2  Fetal Heart Rate(bpm):  145  Cardiac Activity:       Observed  Presentation:           Cephalic  Placenta:               Anterior  P. Cord Insertion:      Previously Visualized  Amniotic Fluid  AFI FV:      Within normal limits  AFI Sum(cm)     %Tile       Largest Pocket(cm)  9.6             11          2.64  RUQ(cm)       RLQ(cm)       LUQ(cm)        LLQ(cm)  2.62          2.64          2.08           2.26 ---------------------------------------------------------------------- Biometry (Fetus B)  BPD:      76.8  mm     G. Age:  30w 6d         50  %    CI:        78.59   %    70 - 86  FL/HC:      20.9   %    19.2 - 21.4  HC:       274   mm     G. Age:  29w 6d          8  %    HC/AC:      1.13        0.99 - 1.21  AC:      242.2  mm     G. Age:  28w 3d          5  %    FL/BPD:     74.5   %    71 - 87  FL:       57.2  mm     G. Age:  30w 0d         24  %    FL/AC:      23.6   %    20 - 24  HUM:      53.1  mm     G. Age:  31w 0d         62  %  Est. FW:    1367  gm           3 lb      9  %     FW Discordancy      0 \ 2 % ---------------------------------------------------------------------- Gestational Age (Fetus B)  LMP:           30w 3d        Date:  03/27/18                 EDD:   01/01/19  U/S Today:     29w 6d                                        EDD:   01/05/19  Best:          30w 3d     Det. By:  LMP  (03/27/18)          EDD:   01/01/19 ---------------------------------------------------------------------- Anatomy (Fetus B)  Cranium:               Appears normal         Aortic Arch:            Previously seen  Cavum:                 Appears normal         Ductal Arch:            Previously seen  Ventricles:            Previously seen        Diaphragm:              Appears normal  Choroid Plexus:        Previously seen        Stomach:                 Appears normal, left  sided  Cerebellum:            Previously seen        Abdomen:                Appears normal  Posterior Fossa:       Previously seen        Abdominal Wall:         Previously seen  Nuchal Fold:           Previously seen        Cord Vessels:           Previously seen  Face:                  Appears normal         Kidneys:                Appear normal                         (orbits and profile)  Lips:                  Previously seen        Bladder:                Appears normal  Thoracic:              Previously seen        Spine:                  Previously seen  Heart:                 Appears normal         Upper Extremities:      Previously seen                         (4CH, axis, and                         situs)  RVOT:                  Appears normal         Lower Extremities:      Previously seen  LVOT:                  Previously seen  Other:  Female gender ---------------------------------------------------------------------- Doppler - Fetal Vessels (Fetus B)  Umbilical Artery   S/D     %tile  2.49       30 ---------------------------------------------------------------------- Impression  Dichorionic-diamniotic twin pregnancy.  Patient returned for fetal growth assessment.  Twin A: Lower fetus, cephalic presentation, posterior  placenta. Head circumference (HC) measurement is at  between -1 and -2 SD. The estimated fetal weight is at the  7th percentile. Amniotic fluid is normal and good fetal activity  is seen. Umbilical artery Doppler study, performed because  of fetal growth restriction, showed normal forward diastolic  flow.  Twin B: Upper fetus, cephalic presentation, anterior placenta.  Head circumference (HC) measurement is at between -1 and  mean. The estimated fetal weight is at the 9th percentile.  Abdominal circumference measurement is at less than the  10th percentile. Amniotic fluid is  normal and good fetal  activity is seen. Umbilical artery Doppler study, performed  because of fetal growth restriction, showed normal forward  diastolic flow.  Growth discordancy: 2% (normal).  Fetal weight is estimated by using Hadlock formula  (consistent with SMFM recommendation).  I explained the significance of fetal growth restriction that  requires close fetal surveillance. We will discuss timing of  delivery after next fetal growth assessment.  Her blood pressures at our office were 144/90 and 134/81  mm Hg. She does not have symptoms of severe features of  preeclampsia. I informed the patient that preeclampsia is  more common in twin pregnancies and fetal growth restriction  can precede preeclampsia in some cases. Patient takes low-  dose aspirin daily. ---------------------------------------------------------------------- Recommendations  -Weekly BPP, NST and umbilical artery Doppler studies till  delivery.  -Fetal growth assessment in 3 weeks. ----------------------------------------------------------------------                  Noralee Space, MD Electronically Signed Final Report   10/26/2018 05:14 pm ----------------------------------------------------------------------  [place] Mfm Ob Follow Up Addl Gest  Result Date: 10/26/2018 ----------------------------------------------------------------------  OBSTETRICS REPORT                       (Signed Final 10/26/2018 05:14 pm) ---------------------------------------------------------------------- Patient Info  ID #:       [id number]                          D.O.B.:  Feb 25, 1996 (23 yrs)  Name:       Linda Berry               Visit Date: 10/26/2018 12:54 pm ---------------------------------------------------------------------- Performed By  Performed By:     Sandi Mealy        Ref. Address:     [address]                    [place]                                                             [address]  Attending:        Noralee Space MD        [facility]  Referred By:      [facility] ---------------------------------------------------------------------- Orders   #  Description                          Code         Ordered By   1  [place] MFM OB FOLLOW UP                  [phone]     CORENTHIAN  BOOKER   2  [place] MFM OB FOLLOW UP ADDL             [id number]     CORENTHIAN      GEST                                              BOOKER   3  [place] MFM UA CORD DOPPLER               76820.02     CORENTHIAN                                                        BOOKER   4  [place] MFM UA ADDL GEST                  96045.40     Lin Landsman  ----------------------------------------------------------------------   #  Order #                    Accession #                 Episode #   1  [id number]                  [id number]   2  [id number]                  [id number]   3  [id number]                  [id number]                  [id number]   4  [id number]                  [id number]                  [id number]  ---------------------------------------------------------------------- Indications   Encounter for other antenatal screening        Z36.2   follow-up ( Low Risk NIPS)   Twin pregnancy, di/di, third trimester         O30.043   [redacted] weeks gestation of pregnancy                Z3A.30   Maternal care for known or suspected poor      O36.5930   fetal growth, third trimester, not applicable or   unspecified  ---------------------------------------------------------------------- Vital Signs  Weight (lb): 178                               Height:  5'0"  BMI:         34.76  BP:          134/81 ---------------------------------------------------------------------- Fetal Evaluation (Fetus A)  Num Of Fetuses:         2  Fetal Heart  Rate(bpm):  151  Cardiac Activity:       Observed  Presentation:           Cephalic  Placenta:               Posterior  P. Cord Insertion:      Previously Visualized  Amniotic Fluid  AFI FV:      Within normal limits  AFI Sum(cm)     %Tile       Largest Pocket(cm)  11.85           29          6.13  RUQ(cm)       RLQ(cm)       LUQ(cm)        LLQ(cm)  2.28          0.03          6.13           3.41 ---------------------------------------------------------------------- Biometry (Fetus A)  BPD:      67.6  mm     G. Age:  27w 2d        < 1  %    CI:        67.74   %    70 - 86                                                          FL/HC:      21.7   %    19.2 - 21.4  HC:      262.8  mm     G. Age:  28w 4d        < 1  %    HC/AC:      1.06        0.99 - 1.21  AC:      247.8  mm     G. Age:  29w 0d         11  %    FL/BPD:     84.2   %    71 - 87  FL:       56.9  mm     G. Age:  29w 6d         21  %    FL/AC:      23.0   %    20 - 24  HUM:      49.9  mm     G. Age:  29w 2d         28  %  Est. FW:    1338  gm    2 lb 15 oz       7  %     FW Discordancy         2  % ---------------------------------------------------------------------- OB History  Gravidity:    1         Term:   0        Prem:   0        SAB:  0  TOP:          0       Ectopic:  0        Living: 0 ---------------------------------------------------------------------- Gestational Age (Fetus A)  LMP:           30w 3d        Date:  03/27/18                 EDD:   01/01/19  U/S Today:     28w 5d                                        EDD:   01/13/19  Best:          30w 3d     Det. By:  LMP  (03/27/18)          EDD:   01/01/19 ---------------------------------------------------------------------- Anatomy (Fetus A)  Cranium:               Appears normal         Aortic Arch:            Previously seen  Cavum:                 Appears normal         Ductal Arch:            Previously seen  Ventricles:            Previously seen        Diaphragm:               Appears normal  Choroid Plexus:        Previously seen        Stomach:                Appears normal, left                                                                        sided  Cerebellum:            Previously seen        Abdomen:                Appears normal  Posterior Fossa:       Previously seen        Abdominal Wall:         Previously seen  Nuchal Fold:           Previously seen        Cord Vessels:           Not well visualized  Face:                  Appears normal         Kidneys:                Appear normal                         (orbits and profile)  Lips:  Previously seen        Bladder:                Appears normal  Thoracic:              Previously seen        Spine:                  Previously seen  Heart:                 Previously seen        Upper Extremities:      Previously seen  RVOT:                  Previously seen        Lower Extremities:      Previously seen  LVOT:                  Previously seen  Other:  Female gender ---------------------------------------------------------------------- Doppler - Fetal Vessels (Fetus A)  Umbilical Artery   S/D     %tile  2.44       27 ---------------------------------------------------------------------- Fetal Evaluation (Fetus B)  Num Of Fetuses:         2  Fetal Heart Rate(bpm):  145  Cardiac Activity:       Observed  Presentation:           Cephalic  Placenta:               Anterior  P. Cord Insertion:      Previously Visualized  Amniotic Fluid  AFI FV:      Within normal limits  AFI Sum(cm)     %Tile       Largest Pocket(cm)  9.6             11          2.64  RUQ(cm)       RLQ(cm)       LUQ(cm)        LLQ(cm)  2.62          2.64          2.08           2.26 ---------------------------------------------------------------------- Biometry (Fetus B)  BPD:      76.8  mm     G. Age:  30w 6d         50  %    CI:        78.59   %    70 - 86                                                          FL/HC:      20.9   %    19.2 -  21.4  HC:       274   mm     G. Age:  29w 6d          8  %    HC/AC:      1.13        0.99 - 1.21  AC:      242.2  mm     G. Age:  28w 3d          5  %  FL/BPD:     74.5   %    71 - 87  FL:       57.2  mm     G. Age:  30w 0d         24  %    FL/AC:      23.6   %    20 - 24  HUM:      53.1  mm     G. Age:  31w 0d         62  %  Est. FW:    1367  gm           3 lb      9  %     FW Discordancy      0 \ 2 % ---------------------------------------------------------------------- Gestational Age (Fetus B)  LMP:           30w 3d        Date:  03/27/18                 EDD:   01/01/19  U/S Today:     29w 6d                                        EDD:   01/05/19  Best:          30w 3d     Det. By:  LMP  (03/27/18)          EDD:   01/01/19 ---------------------------------------------------------------------- Anatomy (Fetus B)  Cranium:               Appears normal         Aortic Arch:            Previously seen  Cavum:                 Appears normal         Ductal Arch:            Previously seen  Ventricles:            Previously seen        Diaphragm:              Appears normal  Choroid Plexus:        Previously seen        Stomach:                Appears normal, left                                                                        sided  Cerebellum:            Previously seen        Abdomen:                Appears normal  Posterior Fossa:       Previously seen        Abdominal Wall:         Previously seen  Nuchal Fold:           Previously seen  Cord Vessels:           Previously seen  Face:                  Appears normal         Kidneys:                Appear normal                         (orbits and profile)  Lips:                  Previously seen        Bladder:                Appears normal  Thoracic:              Previously seen        Spine:                  Previously seen  Heart:                 Appears normal         Upper Extremities:      Previously seen                         (4CH, axis, and                          situs)  RVOT:                  Appears normal         Lower Extremities:      Previously seen  LVOT:                  Previously seen  Other:  Female gender ---------------------------------------------------------------------- Doppler - Fetal Vessels (Fetus B)  Umbilical Artery   S/D     %tile  2.49       30 ---------------------------------------------------------------------- Impression  Dichorionic-diamniotic twin pregnancy.  Patient returned for fetal growth assessment.  Twin A: Lower fetus, cephalic presentation, posterior  placenta. Head circumference (HC) measurement is at  between -1 and -2 SD. The estimated fetal weight is at the  7th percentile. Amniotic fluid is normal and good fetal activity  is seen. Umbilical artery Doppler study, performed because  of fetal growth restriction, showed normal forward diastolic  flow.  Twin B: Upper fetus, cephalic presentation, anterior placenta.  Head circumference (HC) measurement is at between -1 and  mean. The estimated fetal weight is at the 9th percentile.  Abdominal circumference measurement is at less than the  10th percentile. Amniotic fluid is normal and good fetal  activity is seen. Umbilical artery Doppler study, performed  because of fetal growth restriction, showed normal forward  diastolic flow.  Growth discordancy: 2% (normal).  Fetal weight is estimated by using Hadlock formula  (consistent with SMFM recommendation).  I explained the significance of fetal growth restriction that  requires close fetal surveillance. We will discuss timing of  delivery after next fetal growth assessment.  Her blood pressures at our office were 144/90 and 134/81  mm Hg. She does not have symptoms of severe features of  preeclampsia. I informed the patient that preeclampsia is  more common in twin pregnancies and fetal growth restriction  can precede preeclampsia in some cases. Patient takes low-  dose aspirin daily.  ---------------------------------------------------------------------- Recommendations  -Weekly BPP, NST and umbilical artery Doppler studies till  delivery.  -Fetal growth assessment in 3 weeks. ----------------------------------------------------------------------                  Noralee Spaceavi Shankar, MD Electronically Signed Final Report   10/26/2018 05:14 pm ----------------------------------------------------------------------  [place] Mfm Ua Addl Gest  Result Date: 10/26/2018 ----------------------------------------------------------------------  OBSTETRICS REPORT                       (Signed Final 10/26/2018 05:14 pm) ---------------------------------------------------------------------- Patient Info  ID #:       [id number]                          D.O.B.:  Jun 03, 1995 (23 yrs)  Name:       Evert KohlSHENTARIA T Porcher               Visit Date: 10/26/2018 12:54 pm ---------------------------------------------------------------------- Performed By  Performed By:     Sandi MealyJovancia Adrien        Ref. Address:     [address] [address]                    RDMS                                                             [address]  Attending:        Noralee Spaceavi Shankar MD        [facility]  Referred By:      [facility] ---------------------------------------------------------------------- Orders   #  Description                          Code         Ordered By   1  US MFM OB FOLLOW UP                  [id number]     Lin LandsmanORENTHIAN                                                        BOOKER   2  US MFM OB FOLLOW UP ADDL             [id number]     CORENTHIAN      GEST                                              BOOKER   3  [place]  MFM UA CORD DOPPLER               76820.02     Lin Landsman   4  [place] MFM UA ADDL GEST                  16109.60     Lin Landsman   ----------------------------------------------------------------------   #  Order #                    Accession #                 Episode #   1  [id number]                  [id number]   2  [id number]                  [id number]                  [id number]   3  [id number]                  [id number]                  [id number]   4  [id number]                  [id number]                  [id number]  ---------------------------------------------------------------------- Indications   Encounter for other antenatal screening        Z36.2   follow-up ( Low Risk NIPS)   Twin pregnancy, di/di, third trimester         O30.043   [redacted] weeks gestation of pregnancy                Z3A.30   Maternal care for known or suspected poor      O36.5930   fetal growth, third trimester, not applicable or   unspecified  ---------------------------------------------------------------------- Vital Signs  Weight (lb): 178                               Height:        5'0"  BMI:         34.76  BP:          134/81 ---------------------------------------------------------------------- Fetal Evaluation (Fetus A)  Num Of Fetuses:         2  Fetal Heart Rate(bpm):  151  Cardiac Activity:       Observed  Presentation:           Cephalic  Placenta:  Posterior  P. Cord Insertion:      Previously Visualized  Amniotic Fluid  AFI FV:      Within normal limits  AFI Sum(cm)     %Tile       Largest Pocket(cm)  11.85           29          6.13  RUQ(cm)       RLQ(cm)       LUQ(cm)        LLQ(cm)  2.28          0.03          6.13           3.41 ---------------------------------------------------------------------- Biometry (Fetus A)  BPD:      67.6  mm     G. Age:  27w 2d        < 1  %    CI:        67.74   %    70 - 86                                                          FL/HC:      21.7   %    19.2 - 21.4  HC:      262.8  mm     G. Age:  28w 4d        < 1  %    HC/AC:      1.06        0.99 - 1.21  AC:      247.8  mm     G.  Age:  29w 0d         11  %    FL/BPD:     84.2   %    71 - 87  FL:       56.9  mm     G. Age:  29w 6d         21  %    FL/AC:      23.0   %    20 - 24  HUM:      49.9  mm     G. Age:  29w 2d         28  %  Est. FW:    1338  gm    2 lb 15 oz       7  %     FW Discordancy         2  % ---------------------------------------------------------------------- OB History  Gravidity:    1         Term:   0        Prem:   0        SAB:   0  TOP:          0       Ectopic:  0        Living: 0 ---------------------------------------------------------------------- Gestational Age (Fetus A)  LMP:           30w 3d        Date:  03/27/18                 EDD:   01/01/19  U/S Today:  28w 5d                                        EDD:   01/13/19  Best:          30w 3d     Det. By:  LMP  (03/27/18)          EDD:   01/01/19 ---------------------------------------------------------------------- Anatomy (Fetus A)  Cranium:               Appears normal         Aortic Arch:            Previously seen  Cavum:                 Appears normal         Ductal Arch:            Previously seen  Ventricles:            Previously seen        Diaphragm:              Appears normal  Choroid Plexus:        Previously seen        Stomach:                Appears normal, left                                                                        sided  Cerebellum:            Previously seen        Abdomen:                Appears normal  Posterior Fossa:       Previously seen        Abdominal Wall:         Previously seen  Nuchal Fold:           Previously seen        Cord Vessels:           Not well visualized  Face:                  Appears normal         Kidneys:                Appear normal                         (orbits and profile)  Lips:                  Previously seen        Bladder:                Appears normal  Thoracic:              Previously seen        Spine:                  Previously seen  Heart:  Previously seen         Upper Extremities:      Previously seen  RVOT:                  Previously seen        Lower Extremities:      Previously seen  LVOT:                  Previously seen  Other:  Female gender ---------------------------------------------------------------------- Doppler - Fetal Vessels (Fetus A)  Umbilical Artery   S/D     %tile  2.44       27 ---------------------------------------------------------------------- Fetal Evaluation (Fetus B)  Num Of Fetuses:         2  Fetal Heart Rate(bpm):  145  Cardiac Activity:       Observed  Presentation:           Cephalic  Placenta:               Anterior  P. Cord Insertion:      Previously Visualized  Amniotic Fluid  AFI FV:      Within normal limits  AFI Sum(cm)     %Tile       Largest Pocket(cm)  9.6             11          2.64  RUQ(cm)       RLQ(cm)       LUQ(cm)        LLQ(cm)  2.62          2.64          2.08           2.26 ---------------------------------------------------------------------- Biometry (Fetus B)  BPD:      76.8  mm     G. Age:  30w 6d         50  %    CI:        78.59   %    70 - 86                                                          FL/HC:      20.9   %    19.2 - 21.4  HC:       274   mm     G. Age:  29w 6d          8  %    HC/AC:      1.13        0.99 - 1.21  AC:      242.2  mm     G. Age:  28w 3d          5  %    FL/BPD:     74.5   %    71 - 87  FL:       57.2  mm     G. Age:  30w 0d         24  %    FL/AC:      23.6   %    20 - 24  HUM:      53.1  mm     G. Age:  86w 0d  62  %  Est. FW:    1367  gm           3 lb      9  %     FW Discordancy      0 \ 2 % ---------------------------------------------------------------------- Gestational Age (Fetus B)  LMP:           30w 3d        Date:  03/27/18                 EDD:   01/01/19  U/S Today:     29w 6d                                        EDD:   01/05/19  Best:          30w 3d     Det. By:  LMP  (03/27/18)          EDD:   01/01/19  ---------------------------------------------------------------------- Anatomy (Fetus B)  Cranium:               Appears normal         Aortic Arch:            Previously seen  Cavum:                 Appears normal         Ductal Arch:            Previously seen  Ventricles:            Previously seen        Diaphragm:              Appears normal  Choroid Plexus:        Previously seen        Stomach:                Appears normal, left                                                                        sided  Cerebellum:            Previously seen        Abdomen:                Appears normal  Posterior Fossa:       Previously seen        Abdominal Wall:         Previously seen  Nuchal Fold:           Previously seen        Cord Vessels:           Previously seen  Face:                  Appears normal         Kidneys:                Appear normal                         (orbits and  profile)  Lips:                  Previously seen        Bladder:                Appears normal  Thoracic:              Previously seen        Spine:                  Previously seen  Heart:                 Appears normal         Upper Extremities:      Previously seen                         (4CH, axis, and                         situs)  RVOT:                  Appears normal         Lower Extremities:      Previously seen  LVOT:                  Previously seen  Other:  Female gender ---------------------------------------------------------------------- Doppler - Fetal Vessels (Fetus B)  Umbilical Artery   S/D     %tile  2.49       30 ---------------------------------------------------------------------- Impression  Dichorionic-diamniotic twin pregnancy.  Patient returned for fetal growth assessment.  Twin A: Lower fetus, cephalic presentation, posterior  placenta. Head circumference (HC) measurement is at  between -1 and -2 SD. The estimated fetal weight is at the  7th percentile. Amniotic fluid is normal and good fetal activity  is  seen. Umbilical artery Doppler study, performed because  of fetal growth restriction, showed normal forward diastolic  flow.  Twin B: Upper fetus, cephalic presentation, anterior placenta.  Head circumference (HC) measurement is at between -1 and  mean. The estimated fetal weight is at the 9th percentile.  Abdominal circumference measurement is at less than the  10th percentile. Amniotic fluid is normal and good fetal  activity is seen. Umbilical artery Doppler study, performed  because of fetal growth restriction, showed normal forward  diastolic flow.  Growth discordancy: 2% (normal).  Fetal weight is estimated by using Hadlock formula  (consistent with SMFM recommendation).  I explained the significance of fetal growth restriction that  requires close fetal surveillance. We will discuss timing of  delivery after next fetal growth assessment.  Her blood pressures at our office were 144/90 and 134/81  mm Hg. She does not have symptoms of severe features of  preeclampsia. I informed the patient that preeclampsia is  more common in twin pregnancies and fetal growth restriction  can precede preeclampsia in some cases. Patient takes low-  dose aspirin daily. ---------------------------------------------------------------------- Recommendations  -Weekly BPP, NST and umbilical artery Doppler studies till  delivery.  -Fetal growth assessment in 3 weeks. ----------------------------------------------------------------------                  Noralee Space, MD Electronically Signed Final Report   10/26/2018 05:14 pm ----------------------------------------------------------------------  [place] Mfm Ua Cord Doppler  Result Date: 10/26/2018 ----------------------------------------------------------------------  OBSTETRICS REPORT                       (  Signed Final 10/26/2018 05:14 pm) ---------------------------------------------------------------------- Patient Info  ID #:       [id number]                          D.O.B.:   1995-10-22 (23 yrs)  Name:       SUPRIYA BEASTON Worrall               Visit Date: 10/26/2018 12:54 pm ---------------------------------------------------------------------- Performed By  Performed By:     Sandi Mealy        Ref. Address:     [address]                    [address]                                                             [address]  Attending:        Noralee Space MD        [facility]  Referred By:      [facility] ---------------------------------------------------------------------- Orders   #  Description                          Code         Ordered By   1  [place] MFM OB FOLLOW UP                  40981.19     Lin Landsman   2  [place] MFM OB FOLLOW UP ADDL             14782.95     CORENTHIAN      GEST                                              BOOKER   3  [place] MFM UA CORD DOPPLER               76820.02     CORENTHIAN                                                        BOOKER   4  [place] MFM UA ADDL GEST  40981.19     Lin Landsman  ----------------------------------------------------------------------   #  Order #                    Accession #                 Episode #   1  [id number]                  [id number]   2  [id number]                  [id number]                  [id number]   3  [id number]                  [id number]                  [id number]   4  [id number]                  [id number]                  [id number]  ---------------------------------------------------------------------- Indications   Encounter for other antenatal screening        Z36.2   follow-up ( Low Risk NIPS)   Twin pregnancy, di/di, third trimester         O30.043   [redacted] weeks gestation of pregnancy                Z3A.30   Maternal care for known or suspected poor      O36.5930    fetal growth, third trimester, not applicable or   unspecified  ---------------------------------------------------------------------- Vital Signs  Weight (lb): 178                               Height:        5'0"  BMI:         34.76  BP:          134/81 ---------------------------------------------------------------------- Fetal Evaluation (Fetus A)  Num Of Fetuses:         2  Fetal Heart Rate(bpm):  151  Cardiac Activity:       Observed  Presentation:           Cephalic  Placenta:               Posterior  P. Cord Insertion:      Previously Visualized  Amniotic Fluid  AFI FV:      Within normal limits  AFI Sum(cm)     %Tile       Largest Pocket(cm)  11.85           29          6.13  RUQ(cm)       RLQ(cm)       LUQ(cm)        LLQ(cm)  2.28          0.03  6.13           3.41 ---------------------------------------------------------------------- Biometry (Fetus A)  BPD:      67.6  mm     G. Age:  27w 2d        < 1  %    CI:        67.74   %    70 - 86                                                          FL/HC:      21.7   %    19.2 - 21.4  HC:      262.8  mm     G. Age:  28w 4d        < 1  %    HC/AC:      1.06        0.99 - 1.21  AC:      247.8  mm     G. Age:  29w 0d         11  %    FL/BPD:     84.2   %    71 - 87  FL:       56.9  mm     G. Age:  29w 6d         21  %    FL/AC:      23.0   %    20 - 24  HUM:      49.9  mm     G. Age:  29w 2d         28  %  Est. FW:    1338  gm    2 lb 15 oz       7  %     FW Discordancy         2  % ---------------------------------------------------------------------- OB History  Gravidity:    1         Term:   0        Prem:   0        SAB:   0  TOP:          0       Ectopic:  0        Living: 0 ---------------------------------------------------------------------- Gestational Age (Fetus A)  LMP:           30w 3d        Date:  03/27/18                 EDD:   01/01/19  U/S Today:     28w 5d                                        EDD:   01/13/19  Best:          30w 3d      Det. By:  LMP  (03/27/18)          EDD:   01/01/19 ---------------------------------------------------------------------- Anatomy (Fetus A)  Cranium:               Appears normal  Aortic Arch:            Previously seen  Cavum:                 Appears normal         Ductal Arch:            Previously seen  Ventricles:            Previously seen        Diaphragm:              Appears normal  Choroid Plexus:        Previously seen        Stomach:                Appears normal, left                                                                        sided  Cerebellum:            Previously seen        Abdomen:                Appears normal  Posterior Fossa:       Previously seen        Abdominal Wall:         Previously seen  Nuchal Fold:           Previously seen        Cord Vessels:           Not well visualized  Face:                  Appears normal         Kidneys:                Appear normal                         (orbits and profile)  Lips:                  Previously seen        Bladder:                Appears normal  Thoracic:              Previously seen        Spine:                  Previously seen  Heart:                 Previously seen        Upper Extremities:      Previously seen  RVOT:                  Previously seen        Lower Extremities:      Previously seen  LVOT:                  Previously seen  Other:  Female gender ---------------------------------------------------------------------- Doppler - Fetal Vessels (Fetus A)  Umbilical Artery   S/D     %tile  2.44  27 ---------------------------------------------------------------------- Fetal Evaluation (Fetus B)  Num Of Fetuses:         2  Fetal Heart Rate(bpm):  145  Cardiac Activity:       Observed  Presentation:           Cephalic  Placenta:               Anterior  P. Cord Insertion:      Previously Visualized  Amniotic Fluid  AFI FV:      Within normal limits  AFI Sum(cm)     %Tile       Largest Pocket(cm)  9.6              11          2.64  RUQ(cm)       RLQ(cm)       LUQ(cm)        LLQ(cm)  2.62          2.64          2.08           2.26 ---------------------------------------------------------------------- Biometry (Fetus B)  BPD:      76.8  mm     G. Age:  30w 6d         50  %    CI:        78.59   %    70 - 86                                                          FL/HC:      20.9   %    19.2 - 21.4  HC:       274   mm     G. Age:  29w 6d          8  %    HC/AC:      1.13        0.99 - 1.21  AC:      242.2  mm     G. Age:  28w 3d          5  %    FL/BPD:     74.5   %    71 - 87  FL:       57.2  mm     G. Age:  30w 0d         24  %    FL/AC:      23.6   %    20 - 24  HUM:      53.1  mm     G. Age:  31w 0d         62  %  Est. FW:    1367  gm           3 lb      9  %     FW Discordancy      0 \ 2 % ---------------------------------------------------------------------- Gestational Age (Fetus B)  LMP:           30w 3d        Date:  03/27/18                 EDD:   01/01/19  U/S Today:     29w  6d                                        EDD:   01/05/19  Best:          Georgiann Hahn 3d     Det. By:  LMP  (03/27/18)          EDD:   01/01/19 ---------------------------------------------------------------------- Anatomy (Fetus B)  Cranium:               Appears normal         Aortic Arch:            Previously seen  Cavum:                 Appears normal         Ductal Arch:            Previously seen  Ventricles:            Previously seen        Diaphragm:              Appears normal  Choroid Plexus:        Previously seen        Stomach:                Appears normal, left                                                                        sided  Cerebellum:            Previously seen        Abdomen:                Appears normal  Posterior Fossa:       Previously seen        Abdominal Wall:         Previously seen  Nuchal Fold:           Previously seen        Cord Vessels:           Previously seen  Face:                  Appears normal          Kidneys:                Appear normal                         (orbits and profile)  Lips:                  Previously seen        Bladder:                Appears normal  Thoracic:              Previously seen        Spine:                  Previously seen  Heart:  Appears normal         Upper Extremities:      Previously seen                         (4CH, axis, and                         situs)  RVOT:                  Appears normal         Lower Extremities:      Previously seen  LVOT:                  Previously seen  Other:  Female gender ---------------------------------------------------------------------- Doppler - Fetal Vessels (Fetus B)  Umbilical Artery   S/D     %tile  2.49       30 ---------------------------------------------------------------------- Impression  Dichorionic-diamniotic twin pregnancy.  Patient returned for fetal growth assessment.  Twin A: Lower fetus, cephalic presentation, posterior  placenta. Head circumference (HC) measurement is at  between -1 and -2 SD. The estimated fetal weight is at the  7th percentile. Amniotic fluid is normal and good fetal activity  is seen. Umbilical artery Doppler study, performed because  of fetal growth restriction, showed normal forward diastolic  flow.  Twin B: Upper fetus, cephalic presentation, anterior placenta.  Head circumference (HC) measurement is at between -1 and  mean. The estimated fetal weight is at the 9th percentile.  Abdominal circumference measurement is at less than the  10th percentile. Amniotic fluid is normal and good fetal  activity is seen. Umbilical artery Doppler study, performed  because of fetal growth restriction, showed normal forward  diastolic flow.  Growth discordancy: 2% (normal).  Fetal weight is estimated by using Hadlock formula  (consistent with SMFM recommendation).  I explained the significance of fetal growth restriction that  requires close fetal surveillance. We will discuss timing of  delivery  after next fetal growth assessment.  Her blood pressures at our office were 144/90 and 134/81  mm Hg. She does not have symptoms of severe features of  preeclampsia. I informed the patient that preeclampsia is  more common in twin pregnancies and fetal growth restriction  can precede preeclampsia in some cases. Patient takes low-  dose aspirin daily. ---------------------------------------------------------------------- Recommendations  -Weekly BPP, NST and umbilical artery Doppler studies till  delivery.  -Fetal growth assessment in 3 weeks. ----------------------------------------------------------------------                  Noralee Spaceavi Shankar, MD Electronically Signed Final Report   10/26/2018 05:14 pm ----------------------------------------------------------------------   Assessment and Plan:  Pregnancy: G1P0 at 1480w6d 1. Elevated LFTs Resolved  2. Supervision of high risk pregnancy, antepartum Stable  3. Dichorionic diamniotic twin pregnancy in second trimester Stable IUGR noted on last U/S Antenatal testing with MFM  scheduled   6. Asthma affecting pregnancy, antepartum Stable  PRN MDI  7. Intrauterine growth restriction (IUGR) affecting care of mother, third trimester, fetus 2 As above   Preterm labor symptoms and general obstetric precautions including but not limited to vaginal bleeding, contractions, leaking of fluid and fetal movement were reviewed in detail with the patient. I discussed the assessment and treatment plan with the patient. The patient was provided an opportunity to ask questions and all were answered. The patient agreed with the plan and demonstrated an understanding of the instructions. The patient was  advised to call back or seek an in-person office evaluation/go to MAU at [facility] for any urgent or concerning symptoms. Please refer to After Visit Summary for other counseling recommendations.   I provided 10 minutes of face-to-face time during  this encounter.  Return in about 2 weeks (around 11/12/2018) for OB visit, face to face.  Future Appointments  Date Time Provider Department Center  11/09/2018  3:15 PM WH-MFC [place] 4 WH-MFCUS MFC-US  11/16/2018  1:45 PM WH-MFC [place] 2 WH-MFCUS MFC-US  11/24/2018  2:00 PM WH-MFC [place] 3 WH-MFCUS MFC-US    Hermina Staggers, MD [facility], [facility]

## 2018-11-05 ENCOUNTER — Encounter (HOSPITAL_COMMUNITY): Payer: Self-pay | Admitting: *Deleted

## 2018-11-09 ENCOUNTER — Ambulatory Visit (HOSPITAL_COMMUNITY): Payer: Medicaid Other | Admitting: *Deleted

## 2018-11-09 ENCOUNTER — Other Ambulatory Visit (HOSPITAL_COMMUNITY): Payer: Self-pay | Admitting: Obstetrics and Gynecology

## 2018-11-09 ENCOUNTER — Other Ambulatory Visit: Payer: Self-pay

## 2018-11-09 ENCOUNTER — Ambulatory Visit (HOSPITAL_COMMUNITY)
Admission: RE | Admit: 2018-11-09 | Discharge: 2018-11-09 | Disposition: A | Discharge status: Home / Self Care | Payer: Medicaid Other | Source: Ambulatory Visit | Attending: Obstetrics and Gynecology | Admitting: Obstetrics and Gynecology

## 2018-11-09 VITALS — BP 142/79 | HR 98 | Temp 98.7°F

## 2018-11-09 DIAGNOSIS — O30043 Twin pregnancy, dichorionic/diamniotic, third trimester: Secondary | ICD-10-CM

## 2018-11-09 DIAGNOSIS — Z3A32 32 weeks gestation of pregnancy: Secondary | ICD-10-CM

## 2018-11-09 DIAGNOSIS — O30049 Twin pregnancy, dichorionic/diamniotic, unspecified trimester: Secondary | ICD-10-CM

## 2018-11-09 DIAGNOSIS — O365990 Maternal care for other known or suspected poor fetal growth, unspecified trimester, not applicable or unspecified: Secondary | ICD-10-CM

## 2018-11-09 DIAGNOSIS — O365930 Maternal care for other known or suspected poor fetal growth, third trimester, not applicable or unspecified: Secondary | ICD-10-CM | POA: Diagnosis not present

## 2018-11-16 ENCOUNTER — Other Ambulatory Visit: Payer: Self-pay

## 2018-11-16 ENCOUNTER — Encounter: Payer: Self-pay | Admitting: Obstetrics & Gynecology

## 2018-11-16 ENCOUNTER — Ambulatory Visit (INDEPENDENT_AMBULATORY_CARE_PROVIDER_SITE_OTHER): Payer: Medicaid Other | Admitting: Obstetrics & Gynecology

## 2018-11-16 ENCOUNTER — Ambulatory Visit (HOSPITAL_COMMUNITY)
Admission: RE | Admit: 2018-11-16 | Discharge: 2018-11-16 | Disposition: A | Discharge status: Home / Self Care | Payer: Medicaid Other | Source: Ambulatory Visit | Attending: Obstetrics and Gynecology | Admitting: Obstetrics and Gynecology

## 2018-11-16 ENCOUNTER — Encounter (HOSPITAL_COMMUNITY): Payer: Self-pay | Admitting: *Deleted

## 2018-11-16 ENCOUNTER — Ambulatory Visit (HOSPITAL_COMMUNITY): Payer: Medicaid Other | Admitting: *Deleted

## 2018-11-16 ENCOUNTER — Other Ambulatory Visit (HOSPITAL_COMMUNITY): Payer: Self-pay | Admitting: Obstetrics and Gynecology

## 2018-11-16 VITALS — BP 134/85 | HR 116 | Temp 98.7°F

## 2018-11-16 VITALS — BP 118/76 | HR 108 | Wt 189.3 lb

## 2018-11-16 DIAGNOSIS — O0990 Supervision of high risk pregnancy, unspecified, unspecified trimester: Secondary | ICD-10-CM

## 2018-11-16 DIAGNOSIS — Z362 Encounter for other antenatal screening follow-up: Secondary | ICD-10-CM | POA: Diagnosis not present

## 2018-11-16 DIAGNOSIS — O30049 Twin pregnancy, dichorionic/diamniotic, unspecified trimester: Secondary | ICD-10-CM | POA: Insufficient documentation

## 2018-11-16 DIAGNOSIS — O365990 Maternal care for other known or suspected poor fetal growth, unspecified trimester, not applicable or unspecified: Secondary | ICD-10-CM

## 2018-11-16 DIAGNOSIS — O30043 Twin pregnancy, dichorionic/diamniotic, third trimester: Secondary | ICD-10-CM | POA: Diagnosis not present

## 2018-11-16 DIAGNOSIS — O365930 Maternal care for other known or suspected poor fetal growth, third trimester, not applicable or unspecified: Secondary | ICD-10-CM

## 2018-11-16 DIAGNOSIS — Z3A33 33 weeks gestation of pregnancy: Secondary | ICD-10-CM | POA: Diagnosis not present

## 2018-11-16 NOTE — Patient Instructions (Signed)
Return to office for any scheduled appointments. Call the office or go to the MAU at [facility] if:  You begin to have strong, frequent contractions  Your water breaks.  Sometimes it is a big gush of fluid, sometimes it is just a trickle that keeps getting your panties wet or running down your legs  You have vaginal bleeding.  It is normal to have a small amount of spotting if your cervix was checked.   You do not feel your baby moving like normal.  If you do not, get something to eat and drink and lay down and focus on feeling your baby move.   If your baby is still not moving like normal, you should call the office or go to MAU.  Any other obstetric concerns.   

## 2018-11-16 NOTE — Procedures (Signed)
[name] 1995/08/12 [redacted]w[redacted]d  Fetus A Non-Stress Test Interpretation for 11/16/18  Indication: Unsatisfactory BPP [name] Nov 13, 1995 [redacted]w[redacted]d   Fetus B Non-Stress Test Interpretation for 11/16/18  Indication: Unsatisfactory BPP  Fetal Heart Rate Fetus B Mode: External Baseline Rate (B): 135 BPM Variability: Moderate Accelerations: 15 x 15 Decelerations: None  Uterine Activity Mode: Toco Contraction Frequency (min): irreg UC noted Contraction Duration (sec): 60-80 Contraction Quality: Mild Resting Tone Palpated: Relaxed Resting Time: Adequate  Interpretation (Baby B - Fetal Testing) Nonstress Test Interpretation (Baby B): Reactive Comments (Baby B): FHR tracing rev'd by Dr. Donalee Citrin    Fetal Heart Rate A Mode: External Baseline Rate (A): 130 bpm Variability: Moderate Accelerations: 15 x 15 Decelerations: None Multiple birth?: Yes  Uterine Activity Mode: Toco Contraction Frequency (min): irreg UC noted Contraction Duration (sec): 60-80 Contraction Quality: Mild Resting Tone Palpated: Relaxed Resting Time: Adequate  Interpretation (Fetal Testing) Nonstress Test Interpretation: Reactive Comments: FHR tracing rev'd by Dr. Donalee Citrin

## 2018-11-16 NOTE — Progress Notes (Signed)
PRENATAL VISIT NOTE  Subjective:  Linda Berry is a 23 y.o. G1P0 at [redacted]w[redacted]d being seen today for ongoing prenatal care.  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  Patient reports no complaints.  Contractions: Not present. Vag. Bleeding: None.  Movement: Present. Denies leaking of fluid.   The following portions of the patient's history were reviewed and updated as appropriate: allergies, current medications, past family history, past medical history, past social history, past surgical history and problem list.   Objective:   Vitals:   11/16/18 1150  BP: 118/76  Pulse: (!) 108  Weight: 189 lb 4.8 oz (85.9 kg)    Fetal Status: Fetal Heart Rate (bpm): 142/147   Movement: Present     General:  Alert, oriented and cooperative. Patient is in no acute distress.  Skin: Skin is warm and dry. No rash noted.   Cardiovascular: Normal heart rate noted  Respiratory: Normal respiratory effort, no problems with respiration noted  Abdomen: Soft, gravid, appropriate for gestational age.  Pain/Pressure: Present     Pelvic: Cervical exam deferred        Extremities: Normal range of motion.  Edema: Trace  Mental Status: Normal mood and affect. Normal behavior. Normal judgment and thought content.   Imaging: [place] Mfm Fetal Bpp Wo Non Stress  Result Date: 11/09/2018 ----------------------------------------------------------------------  OBSTETRICS REPORT                       (Signed Final 11/09/2018 04:50 pm) ---------------------------------------------------------------------- Patient Info  ID #:       [id number]                          D.O.B.:  March 08, 1996 (23  yrs)  Name:       Linda Berry               Visit Date: 11/09/2018 04:03 pm ---------------------------------------------------------------------- Performed By  Performed By:     Marcellina Millin          Ref. Address:     [address]                    [place]                                                             [address]  Attending:        Noralee Space MD        [facility]  Referred By:      [facility] ---------------------------------------------------------------------- Orders   #  Description                          Code         Ordered By   1  [place] MFM FETAL BPP WO NON              76819.01     RAVI SHANKAR      STRESS   2  [place] MFM UA CORD DOPPLER               76820.02     RAVI SHANKAR   3  [place] MFM UA ADDL GEST                  19147.82     RAVI SHANKAR   4  [place] MFM FETAL BPP WO NST              95621.3      RAVI [facility]      ADDL GESTATION  ----------------------------------------------------------------------   #  Order #                    Accession #                 Episode #   1  [id number]                  [id number]                  [id number]   2  [id number]                  [id number]                  [id number]   3  [id number]                  [id number]                  [id number]   4  [id number]                  [id number]                  [id number]  ---------------------------------------------------------------------- Indications   Maternal care for known or suspected poor      O36.5930   fetal growth, third trimester, not applicable or   unspecified   [redacted] weeks gestation of pregnancy                Z3A.63   Twin pregnancy, di/di, third trimester         O30.043  ---------------------------------------------------------------------- Vital Signs                                                 Height:        5'0" ---------------------------------------------------------------------- Fetal Evaluation (Fetus  A)  Num Of Fetuses:         2  Fetal Heart Rate(bpm):  130  Cardiac Activity:       Observed  Fetal Lie:              Lower Fetus  Presentation:           Cephalic  Placenta:               Posterior  Membrane  Desc:      Dividing Membrane seen - Dichorionic.  Amniotic Fluid  AFI FV:      Within normal limits                              Largest Pocket(cm)                              3.5 ---------------------------------------------------------------------- Biophysical Evaluation (Fetus A)  Amniotic F.V:   Within normal limits       F. Tone:        Observed  F. Movement:    Observed                   Score:          8/8  F. Breathing:   Observed ---------------------------------------------------------------------- OB History  Gravidity:    1         Term:   0        Prem:   0        SAB:   0  TOP:          0       Ectopic:  0        Living: 0 ---------------------------------------------------------------------- Gestational Age (Fetus A)  LMP:           32w 3d        Date:  03/27/18                 EDD:   01/01/19  Best:          [name] 3d     Det. By:  LMP  (03/27/18)          EDD:   01/01/19 ---------------------------------------------------------------------- Anatomy (Fetus A)  Stomach:               Appears normal, left   Bladder:                Appears normal                         sided ---------------------------------------------------------------------- Doppler - Fetal Vessels (Fetus A)  Umbilical Artery   S/D     %tile  2.93       67 ---------------------------------------------------------------------- Fetal Evaluation (Fetus B)  Num Of Fetuses:         2  Fetal Heart Rate(bpm):  140  Cardiac Activity:       Observed  Fetal Lie:              Upper Fetus  Presentation:           Cephalic  Placenta:               Anterior  Membrane Desc:      Dividing Membrane seen - Dichorionic.  Amniotic Fluid  AFI FV:      Within normal limits                              Largest Pocket(cm)                              5  ---------------------------------------------------------------------- Biophysical Evaluation (Fetus B)  Amniotic F.V:   Within normal limits  F. Tone:        Observed  F. Movement:    Observed                   Score:          8/8  F. Breathing:   Observed ---------------------------------------------------------------------- Gestational Age (Fetus B)  LMP:           32w 3d        Date:  03/27/18                 EDD:   01/01/19  Best:          [name] 3d     Det. By:  LMP  (03/27/18)          EDD:   01/01/19 ---------------------------------------------------------------------- Anatomy (Fetus B)  Stomach:               Appears normal, left   Bladder:                Appears normal                         sided ---------------------------------------------------------------------- Doppler - Fetal Vessels (Fetus B)  Umbilical Artery   S/D     %tile                                            ADFV    RDFV  2.31       28                                                No      No ---------------------------------------------------------------------- Impression  Twin A: Lower fetus, cephalic, posterior placenta. Amniotic  fluid is normal and good fetal activity is seen. Umbilical artery  Doppler showed normal forward diastolic flow. Antenatal  testing is reassuring. BPP 8/8.  Twin B: Upper fetus, cephalic, anterior placenta. Amniotic  fluid is normal and good fetal activity is seen. Umbilical artery  Doppler showed normal forward diastolic flow. Antenatal  testing is reassuring. BPP 8/8.  We reassured the patient of the findings. ---------------------------------------------------------------------- Recommendations  -Continue weekly BPP and Doppler till delivery.  -Fetal growth assessment in 2 weeks. ----------------------------------------------------------------------                  Noralee Space, MD Electronically Signed Final Report   11/09/2018 04:50 pm  ----------------------------------------------------------------------  [place] Mfm Ob Follow Up  Result Date: 10/26/2018 ----------------------------------------------------------------------  OBSTETRICS REPORT                       (Signed Final 10/26/2018 05:14 pm) ---------------------------------------------------------------------- Patient Info  ID #:       [id number]                          D.O.B.:  06-21-1995 (23 yrs)  Name:       Linda Berry               Visit Date: 10/26/2018 12:54 pm ---------------------------------------------------------------------- Performed By  Performed By:     Sandi Mealy        Ref. Address:  [address]                    [place]                                                             [address]  Attending:        Noralee Space MD        [facility]  Referred By:      [facility] ---------------------------------------------------------------------- Orders   #  Description                          Code         Ordered By   1  [place] MFM OB FOLLOW UP                  81191.47     Lin Landsman   2  [place] MFM OB FOLLOW UP ADDL             82956.21     CORENTHIAN      GEST                                              BOOKER   3  [place] MFM UA CORD DOPPLER               76820.02     CORENTHIAN                                                        BOOKER   4  [place] MFM UA ADDL GEST                  30865.78     Lin Landsman  ----------------------------------------------------------------------   #  Order #  Accession #                 Episode #   1  [id number]                  [id number]                  [id number]   2  [id number]                  [id number]                  [id number]   3  [id number]                  [id number]                  [id number]   4   [id number]                  [id number]                  [id number]  ---------------------------------------------------------------------- Indications   Encounter for other antenatal screening        Z36.2   follow-up ( Low Risk NIPS)   Twin pregnancy, di/di, third trimester         O30.043   [redacted] weeks gestation of pregnancy                Z3A.30   Maternal care for known or suspected poor      O36.5930   fetal growth, third trimester, not applicable or   unspecified  ---------------------------------------------------------------------- Vital Signs  Weight (lb): 178                               Height:        5'0"  BMI:         34.76  BP:          134/81 ---------------------------------------------------------------------- Fetal Evaluation (Fetus A)  Num Of Fetuses:         2  Fetal Heart Rate(bpm):  151  Cardiac Activity:       Observed  Presentation:           Cephalic  Placenta:               Posterior  P. Cord Insertion:      Previously Visualized  Amniotic Fluid  AFI FV:      Within normal limits  AFI Sum(cm)     %Tile       Largest Pocket(cm)  11.85           29          6.13  RUQ(cm)       RLQ(cm)       LUQ(cm)        LLQ(cm)  2.28          0.03          6.13           3.41 ---------------------------------------------------------------------- Biometry (Fetus A)  BPD:      67.6  mm     G. Age:  27w 2d        < 1  %    CI:        67.74   %    70 - 86  FL/HC:      21.7   %    19.2 - 21.4  HC:      262.8  mm     G. Age:  28w 4d        < 1  %    HC/AC:      1.06        0.99 - 1.21  AC:      247.8  mm     G. Age:  29w 0d         11  %    FL/BPD:     84.2   %    71 - 87  FL:       56.9  mm     G. Age:  29w 6d         21  %    FL/AC:      23.0   %    20 - 24  HUM:      49.9  mm     G. Age:  29w 2d         28  %  Est. FW:    1338  gm    2 lb 15 oz       7  %     FW Discordancy         2  % ---------------------------------------------------------------------- OB  History  Gravidity:    1         Term:   0        Prem:   0        SAB:   0  TOP:          0       Ectopic:  0        Living: 0 ---------------------------------------------------------------------- Gestational Age (Fetus A)  LMP:           30w 3d        Date:  03/27/18                 EDD:   01/01/19  U/S Today:     28w 5d                                        EDD:   01/13/19  Best:          30w 3d     Det. By:  LMP  (03/27/18)          EDD:   01/01/19 ---------------------------------------------------------------------- Anatomy (Fetus A)  Cranium:               Appears normal         Aortic Arch:            Previously seen  Cavum:                 Appears normal         Ductal Arch:            Previously seen  Ventricles:            Previously seen        Diaphragm:              Appears normal  Choroid Plexus:        Previously seen        Stomach:  Appears normal, left                                                                        sided  Cerebellum:            Previously seen        Abdomen:                Appears normal  Posterior Fossa:       Previously seen        Abdominal Wall:         Previously seen  Nuchal Fold:           Previously seen        Cord Vessels:           Not well visualized  Face:                  Appears normal         Kidneys:                Appear normal                         (orbits and profile)  Lips:                  Previously seen        Bladder:                Appears normal  Thoracic:              Previously seen        Spine:                  Previously seen  Heart:                 Previously seen        Upper Extremities:      Previously seen  RVOT:                  Previously seen        Lower Extremities:      Previously seen  LVOT:                  Previously seen  Other:  Female gender ---------------------------------------------------------------------- Doppler - Fetal Vessels (Fetus A)  Umbilical Artery   S/D     %tile  2.44       27  ---------------------------------------------------------------------- Fetal Evaluation (Fetus B)  Num Of Fetuses:         2  Fetal Heart Rate(bpm):  145  Cardiac Activity:       Observed  Presentation:           Cephalic  Placenta:               Anterior  P. Cord Insertion:      Previously Visualized  Amniotic Fluid  AFI FV:      Within normal limits  AFI Sum(cm)     %Tile       Largest Pocket(cm)  9.6             11  2.64  RUQ(cm)       RLQ(cm)       LUQ(cm)        LLQ(cm)  2.62          2.64          2.08           2.26 ---------------------------------------------------------------------- Biometry (Fetus B)  BPD:      76.8  mm     G. Age:  30w 6d         50  %    CI:        78.59   %    70 - 86                                                          FL/HC:      20.9   %    19.2 - 21.4  HC:       274   mm     G. Age:  29w 6d          8  %    HC/AC:      1.13        0.99 - 1.21  AC:      242.2  mm     G. Age:  28w 3d          5  %    FL/BPD:     74.5   %    71 - 87  FL:       57.2  mm     G. Age:  30w 0d         24  %    FL/AC:      23.6   %    20 - 24  HUM:      53.1  mm     G. Age:  31w 0d         62  %  Est. FW:    1367  gm           3 lb      9  %     FW Discordancy      0 \ 2 % ---------------------------------------------------------------------- Gestational Age (Fetus B)  LMP:           30w 3d        Date:  03/27/18                 EDD:   01/01/19  U/S Today:     29w 6d                                        EDD:   01/05/19  Best:          30w 3d     Det. By:  LMP  (03/27/18)          EDD:   01/01/19 ---------------------------------------------------------------------- Anatomy (Fetus B)  Cranium:               Appears normal         Aortic Arch:            Previously seen  Cavum:  Appears normal         Ductal Arch:            Previously seen  Ventricles:            Previously seen        Diaphragm:              Appears normal  Choroid Plexus:        Previously seen        Stomach:                 Appears normal, left                                                                        sided  Cerebellum:            Previously seen        Abdomen:                Appears normal  Posterior Fossa:       Previously seen        Abdominal Wall:         Previously seen  Nuchal Fold:           Previously seen        Cord Vessels:           Previously seen  Face:                  Appears normal         Kidneys:                Appear normal                         (orbits and profile)  Lips:                  Previously seen        Bladder:                Appears normal  Thoracic:              Previously seen        Spine:                  Previously seen  Heart:                 Appears normal         Upper Extremities:      Previously seen                         (4CH, axis, and                         situs)  RVOT:                  Appears normal         Lower Extremities:      Previously seen  LVOT:                  Previously seen  Other:  Female gender ---------------------------------------------------------------------- Doppler -  Fetal Vessels (Fetus B)  Umbilical Artery   S/D     %tile  2.49       30 ---------------------------------------------------------------------- Impression  Dichorionic-diamniotic twin pregnancy.  Patient returned for fetal growth assessment.  Twin A: Lower fetus, cephalic presentation, posterior  placenta. Head circumference (HC) measurement is at  between -1 and -2 SD. The estimated fetal weight is at the  7th percentile. Amniotic fluid is normal and good fetal activity  is seen. Umbilical artery Doppler study, performed because  of fetal growth restriction, showed normal forward diastolic  flow.  Twin B: Upper fetus, cephalic presentation, anterior placenta.  Head circumference (HC) measurement is at between -1 and  mean. The estimated fetal weight is at the 9th percentile.  Abdominal circumference measurement is at less than the  10th percentile. Amniotic fluid is  normal and good fetal  activity is seen. Umbilical artery Doppler study, performed  because of fetal growth restriction, showed normal forward  diastolic flow.  Growth discordancy: 2% (normal).  Fetal weight is estimated by using Hadlock formula  (consistent with SMFM recommendation).  I explained the significance of fetal growth restriction that  requires close fetal surveillance. We will discuss timing of  delivery after next fetal growth assessment.  Her blood pressures at our office were 144/90 and 134/81  mm Hg. She does not have symptoms of severe features of  preeclampsia. I informed the patient that preeclampsia is  more common in twin pregnancies and fetal growth restriction  can precede preeclampsia in some cases. Patient takes low-  dose aspirin daily. ---------------------------------------------------------------------- Recommendations  -Weekly BPP, NST and umbilical artery Doppler studies till  delivery.  -Fetal growth assessment in 3 weeks. ----------------------------------------------------------------------                  Noralee Space, MD Electronically Signed Final Report   10/26/2018 05:14 pm ----------------------------------------------------------------------  [place] Mfm Ob Follow Up Addl Gest  Result Date: 10/26/2018 ----------------------------------------------------------------------  OBSTETRICS REPORT                       (Signed Final 10/26/2018 05:14 pm) ---------------------------------------------------------------------- Patient Info  ID #:       [id number]                          D.O.B.:  1995/08/13 (23 yrs)  Name:       Linda Berry               Visit Date: 10/26/2018 12:54 pm ---------------------------------------------------------------------- Performed By  Performed By:     Sandi Mealy        Ref. Address:     [address]                    [place]                                                             [address]  Attending:        Noralee Space MD        [facility]  [facility] ---------------------------------------------------------------------- Orders   #  Description                          Code         Ordered By   1  US MFM OB FOLLOW UP                  16109.6076816.01     Lin LandsmanORENTHIAN                                                        BOOKER   2  US MFM OB FOLLOW UP ADDL             [id number]     CORENTHIAN      GEST                                              BOOKER   3  US MFM UA CORD DOPPLER               76820.02     CORENTHIAN                                                        BOOKER   4  US MFM UA ADDL GEST                  [id number]     Lin LandsmanORENTHIAN                                                        BOOKER  ----------------------------------------------------------------------   #  Order #                    Accession #                 Episode #   1  [id number]   2  [id number]   3  [id number]   4  [id number]                  [id number]  [id number]  ---------------------------------------------------------------------- Indications   Encounter for other antenatal screening        Z36.2   follow-up ( Low Risk NIPS)   Twin pregnancy, di/di, third trimester         O30.043   [redacted] weeks gestation of pregnancy                Z3A.30   Maternal care for known or suspected poor      O36.5930   fetal growth, third trimester, not applicable or   unspecified  ---------------------------------------------------------------------- Vital Signs  Weight (lb): 178                               Height:        5'0"  BMI:         34.76  BP:          134/81 ---------------------------------------------------------------------- Fetal Evaluation (Fetus A)  Num Of Fetuses:         2  Fetal Heart  Rate(bpm):  151  Cardiac Activity:       Observed  Presentation:           Cephalic  Placenta:               Posterior  P. Cord Insertion:      Previously Visualized  Amniotic Fluid  AFI FV:      Within normal limits  AFI Sum(cm)     %Tile       Largest Pocket(cm)  11.85           29          6.13  RUQ(cm)       RLQ(cm)       LUQ(cm)        LLQ(cm)  2.28          0.03          6.13           3.41 ---------------------------------------------------------------------- Biometry (Fetus A)  BPD:      67.6  mm     G. Age:  27w 2d        < 1  %    CI:        67.74   %    70 - 86                                                          FL/HC:      21.7   %    19.2 - 21.4  HC:      262.8  mm     G. Age:  28w 4d        < 1  %    HC/AC:      1.06        0.99 - 1.21  AC:      247.8  mm     G. Age:  29w 0d         11  %    FL/BPD:     84.2   %    71 - 87  FL:       56.9  mm     G. Age:  29w 6d  21  %    FL/AC:      23.0   %    20 - 24  HUM:      49.9  mm     G. Age:  29w 2d         28  %  Est. FW:    1338  gm    2 lb 15 oz       7  %     FW Discordancy         2  % ---------------------------------------------------------------------- OB History  Gravidity:    1         Term:   0        Prem:   0        SAB:   0  TOP:          0       Ectopic:  0        Living: 0 ---------------------------------------------------------------------- Gestational Age (Fetus A)  LMP:           30w 3d        Date:  03/27/18                 EDD:   01/01/19  U/S Today:     28w 5d                                        EDD:   01/13/19  Best:          30w 3d     Det. By:  LMP  (03/27/18)          EDD:   01/01/19 ---------------------------------------------------------------------- Anatomy (Fetus A)  Cranium:               Appears normal         Aortic Arch:            Previously seen  Cavum:                 Appears normal         Ductal Arch:            Previously seen  Ventricles:            Previously seen        Diaphragm:               Appears normal  Choroid Plexus:        Previously seen        Stomach:                Appears normal, left                                                                        sided  Cerebellum:            Previously seen        Abdomen:                Appears normal  Posterior Fossa:       Previously seen        Abdominal  Wall:         Previously seen  Nuchal Fold:           Previously seen        Cord Vessels:           Not well visualized  Face:                  Appears normal         Kidneys:                Appear normal                         (orbits and profile)  Lips:                  Previously seen        Bladder:                Appears normal  Thoracic:              Previously seen        Spine:                  Previously seen  Heart:                 Previously seen        Upper Extremities:      Previously seen  RVOT:                  Previously seen        Lower Extremities:      Previously seen  LVOT:                  Previously seen  Other:  Female gender ---------------------------------------------------------------------- Doppler - Fetal Vessels (Fetus A)  Umbilical Artery   S/D     %tile  2.44       27 ---------------------------------------------------------------------- Fetal Evaluation (Fetus B)  Num Of Fetuses:         2  Fetal Heart Rate(bpm):  145  Cardiac Activity:       Observed  Presentation:           Cephalic  Placenta:               Anterior  P. Cord Insertion:      Previously Visualized  Amniotic Fluid  AFI FV:      Within normal limits  AFI Sum(cm)     %Tile       Largest Pocket(cm)  9.6             11          2.64  RUQ(cm)       RLQ(cm)       LUQ(cm)        LLQ(cm)  2.62          2.64          2.08           2.26 ---------------------------------------------------------------------- Biometry (Fetus B)  BPD:      76.8  mm     G. Age:  30w 6d         50  %    CI:        78.59   %    70 - 86  FL/HC:      20.9   %    19.2 -  21.4  HC:       274   mm     G. Age:  29w 6d          8  %    HC/AC:      1.13        0.99 - 1.21  AC:      242.2  mm     G. Age:  28w 3d          5  %    FL/BPD:     74.5   %    71 - 87  FL:       57.2  mm     G. Age:  30w 0d         24  %    FL/AC:      23.6   %    20 - 24  HUM:      53.1  mm     G. Age:  31w 0d         62  %  Est. FW:    1367  gm           3 lb      9  %     FW Discordancy      0 \ 2 % ---------------------------------------------------------------------- Gestational Age (Fetus B)  LMP:           30w 3d        Date:  03/27/18                 EDD:   01/01/19  U/S Today:     29w 6d                                        EDD:   01/05/19  Best:          30w 3d     Det. By:  LMP  (03/27/18)          EDD:   01/01/19 ---------------------------------------------------------------------- Anatomy (Fetus B)  Cranium:               Appears normal         Aortic Arch:            Previously seen  Cavum:                 Appears normal         Ductal Arch:            Previously seen  Ventricles:            Previously seen        Diaphragm:              Appears normal  Choroid Plexus:        Previously seen        Stomach:                Appears normal, left  sided  Cerebellum:            Previously seen        Abdomen:                Appears normal  Posterior Fossa:       Previously seen        Abdominal Wall:         Previously seen  Nuchal Fold:           Previously seen        Cord Vessels:           Previously seen  Face:                  Appears normal         Kidneys:                Appear normal                         (orbits and profile)  Lips:                  Previously seen        Bladder:                Appears normal  Thoracic:              Previously seen        Spine:                  Previously seen  Heart:                 Appears normal         Upper Extremities:      Previously seen                         (4CH, axis, and                          situs)  RVOT:                  Appears normal         Lower Extremities:      Previously seen  LVOT:                  Previously seen  Other:  Female gender ---------------------------------------------------------------------- Doppler - Fetal Vessels (Fetus B)  Umbilical Artery   S/D     %tile  2.49       30 ---------------------------------------------------------------------- Impression  Dichorionic-diamniotic twin pregnancy.  Patient returned for fetal growth assessment.  Twin A: Lower fetus, cephalic presentation, posterior  placenta. Head circumference (HC) measurement is at  between -1 and -2 SD. The estimated fetal weight is at the  7th percentile. Amniotic fluid is normal and good fetal activity  is seen. Umbilical artery Doppler study, performed because  of fetal growth restriction, showed normal forward diastolic  flow.  Twin B: Upper fetus, cephalic presentation, anterior placenta.  Head circumference (HC) measurement is at between -1 and  mean. The estimated fetal weight is at the 9th percentile.  Abdominal circumference measurement is at less than the  10th percentile. Amniotic fluid is normal and good fetal  activity is seen. Umbilical artery Doppler study, performed  because of fetal growth restriction, showed normal forward  diastolic flow.  Growth discordancy: 2% (normal).  Fetal weight is estimated by using Hadlock formula  (consistent with SMFM recommendation).  I explained the significance of fetal growth restriction that  requires close fetal surveillance. We will discuss timing of  delivery after next fetal growth assessment.  Her blood pressures at our office were 144/90 and 134/81  mm Hg. She does not have symptoms of severe features of  preeclampsia. I informed the patient that preeclampsia is  more common in twin pregnancies and fetal growth restriction  can precede preeclampsia in some cases. Patient takes low-  dose aspirin daily.  ---------------------------------------------------------------------- Recommendations  -Weekly BPP, NST and umbilical artery Doppler studies till  delivery.  -Fetal growth assessment in 3 weeks. ----------------------------------------------------------------------                  Noralee Space, MD Electronically Signed Final Report   10/26/2018 05:14 pm ----------------------------------------------------------------------  [place] Mfm Ua Addl Gest  Result Date: 11/09/2018 ----------------------------------------------------------------------  OBSTETRICS REPORT                       (Signed Final 11/09/2018 04:50 pm) ---------------------------------------------------------------------- Patient Info  ID #:       [id number]                          D.O.B.:  [birth year] (23 yrs)  Name:       AIRELLE EVERDING Mccanless               Visit Date: 11/09/2018 04:03 pm ---------------------------------------------------------------------- Performed By  Performed By:     Marcellina Millin          Ref. Address:     [address]                    [place]                                                             [address]  Attending:        Noralee Space MD        [facility]  Referred By:      [facility] ---------------------------------------------------------------------- Orders   #  Description                          Code         Ordered By   1  [place] MFM FETAL BPP WO NON              76819.01     RAVI SHANKAR      STRESS   2  [place] MFM UA CORD DOPPLER               40981.19     RAVI SHANKAR   3  [place] MFM UA ADDL GEST  76820.01     RAVI SHANKAR   4  [place] MFM FETAL BPP WO NST              16109.6      RAVI [facility]      ADDL GESTATION  ----------------------------------------------------------------------   #  Order #                    Accession #                 Episode #   1  [id number]                  [id number]                   [id number]   2  [id number]                  [id number]                  [id number]   3  [id number]                  [id number]                  [id number]   4  [id number]                  [id number]                  [id number]  ---------------------------------------------------------------------- Indications   Maternal care for known or suspected poor      O36.5930   fetal growth, third trimester, not applicable or   unspecified   [redacted] weeks gestation of pregnancy                Z3A.59   Twin pregnancy, di/di, third trimester         O30.043  ---------------------------------------------------------------------- Vital Signs                                                 Height:        5'0" ---------------------------------------------------------------------- Fetal Evaluation (Fetus A)  Num Of Fetuses:         2  Fetal Heart Rate(bpm):  130  Cardiac Activity:       Observed  Fetal Lie:              Lower Fetus  Presentation:           Cephalic  Placenta:               Posterior  Membrane Desc:      Dividing Membrane seen - Dichorionic.  Amniotic Fluid  AFI FV:      Within normal limits                              Largest Pocket(cm)                              3.5 ---------------------------------------------------------------------- Biophysical Evaluation (Fetus A)  Amniotic F.V:   Within normal limits       F. Tone:        Observed  F. Movement:    Observed  Score:          8/8  F. Breathing:   Observed ---------------------------------------------------------------------- OB History  Gravidity:    1         Term:   0        Prem:   0        SAB:   0  TOP:          0       Ectopic:  0        Living: 0 ---------------------------------------------------------------------- Gestational Age (Fetus A)  LMP:           32w 3d        Date:  03/27/18                 EDD:   01/01/19  Best:          [name] 3d     Det. By:  LMP  (03/27/18)          EDD:   01/01/19  ---------------------------------------------------------------------- Anatomy (Fetus A)  Stomach:               Appears normal, left   Bladder:                Appears normal                         sided ---------------------------------------------------------------------- Doppler - Fetal Vessels (Fetus A)  Umbilical Artery   S/D     %tile  2.93       67 ---------------------------------------------------------------------- Fetal Evaluation (Fetus B)  Num Of Fetuses:         2  Fetal Heart Rate(bpm):  140  Cardiac Activity:       Observed  Fetal Lie:              Upper Fetus  Presentation:           Cephalic  Placenta:               Anterior  Membrane Desc:      Dividing Membrane seen - Dichorionic.  Amniotic Fluid  AFI FV:      Within normal limits                              Largest Pocket(cm)                              5 ---------------------------------------------------------------------- Biophysical Evaluation (Fetus B)  Amniotic F.V:   Within normal limits       F. Tone:        Observed  F. Movement:    Observed                   Score:          8/8  F. Breathing:   Observed ---------------------------------------------------------------------- Gestational Age (Fetus B)  LMP:           32w 3d        Date:  03/27/18                 EDD:   01/01/19  Best:          [name] 3d     Det. By:  LMP  (03/27/18)          EDD:   01/01/19 ---------------------------------------------------------------------- Anatomy (Fetus  B)  Stomach:               Appears normal, left   Bladder:                Appears normal                         sided ---------------------------------------------------------------------- Doppler - Fetal Vessels (Fetus B)  Umbilical Artery   S/D     %tile                                            ADFV    RDFV  2.31       28                                                No      No ---------------------------------------------------------------------- Impression  Twin A: Lower fetus, cephalic,  posterior placenta. Amniotic  fluid is normal and good fetal activity is seen. Umbilical artery  Doppler showed normal forward diastolic flow. Antenatal  testing is reassuring. BPP 8/8.  Twin B: Upper fetus, cephalic, anterior placenta. Amniotic  fluid is normal and good fetal activity is seen. Umbilical artery  Doppler showed normal forward diastolic flow. Antenatal  testing is reassuring. BPP 8/8.  We reassured the patient of the findings. ---------------------------------------------------------------------- Recommendations  -Continue weekly BPP and Doppler till delivery.  -Fetal growth assessment in 2 weeks. ----------------------------------------------------------------------                  Noralee Space, MD Electronically Signed Final Report   11/09/2018 04:50 pm ----------------------------------------------------------------------  [place] Mfm Ua Addl Gest  Result Date: 10/26/2018 ----------------------------------------------------------------------  OBSTETRICS REPORT                       (Signed Final 10/26/2018 05:14 pm) ---------------------------------------------------------------------- Patient Info  ID #:       [id number]                          D.O.B.:  08-10-1995 (23 yrs)  Name:       Linda Berry               Visit Date: 10/26/2018 12:54 pm ---------------------------------------------------------------------- Performed By  Performed By:     Sandi Mealy        Ref. Address:     [address]                    [place]                                                             [address]  Attending:        Noralee Space MD        [facility]  Referred By:      [facility] ---------------------------------------------------------------------- Orders   #  Description                          Code         Ordered By   1  [place] MFM OB FOLLOW UP                  16109.60     Lin Landsman   2  [place] MFM OB FOLLOW UP ADDL             45409.81     CORENTHIAN      GEST                                              BOOKER   3  [place] MFM UA CORD DOPPLER               76820.02     CORENTHIAN                                                        BOOKER   4  [place] MFM UA ADDL GEST                  19147.82     Lin Landsman  ----------------------------------------------------------------------   #  Order #                    Accession #                 Episode #   1  [id number]                  [id number]                  [id number]   2  [id number]                  [id number]                  [id number]   3  [id number]                  [id number]                  [id number]   4  [id number]                  [id number]  [id number]  ---------------------------------------------------------------------- Indications   Encounter for other antenatal screening        Z36.2   follow-up ( Low Risk NIPS)   Twin pregnancy, di/di, third trimester         O30.043   [redacted] weeks gestation of pregnancy                Z3A.30   Maternal care for known or suspected poor      O36.5930   fetal growth, third trimester, not applicable or   unspecified  ---------------------------------------------------------------------- Vital Signs  Weight (lb): 178                               Height:        5'0"  BMI:         34.76  BP:          134/81 ---------------------------------------------------------------------- Fetal Evaluation (Fetus A)  Num Of Fetuses:         2  Fetal Heart Rate(bpm):  151  Cardiac Activity:       Observed  Presentation:           Cephalic  Placenta:               Posterior  P. Cord Insertion:      Previously Visualized  Amniotic Fluid  AFI FV:      Within normal limits  AFI Sum(cm)     %Tile       Largest Pocket(cm)  11.85           29          6.13  RUQ(cm)       RLQ(cm)       LUQ(cm)        LLQ(cm)  2.28           0.03          6.13           3.41 ---------------------------------------------------------------------- Biometry (Fetus A)  BPD:      67.6  mm     G. Age:  27w 2d        < 1  %    CI:        67.74   %    70 - 86                                                          FL/HC:      21.7   %    19.2 - 21.4  HC:      262.8  mm     G. Age:  28w 4d        < 1  %    HC/AC:      1.06        0.99 - 1.21  AC:      247.8  mm     G. Age:  29w 0d         11  %    FL/BPD:     84.2   %    71 - 87  FL:       56.9  mm     G. Age:  29w 6d  21  %    FL/AC:      23.0   %    20 - 24  HUM:      49.9  mm     G. Age:  29w 2d         28  %  Est. FW:    1338  gm    2 lb 15 oz       7  %     FW Discordancy         2  % ---------------------------------------------------------------------- OB History  Gravidity:    1         Term:   0        Prem:   0        SAB:   0  TOP:          0       Ectopic:  0        Living: 0 ---------------------------------------------------------------------- Gestational Age (Fetus A)  LMP:           30w 3d        Date:  03/27/18                 EDD:   01/01/19  U/S Today:     28w 5d                                        EDD:   01/13/19  Best:          30w 3d     Det. By:  LMP  (03/27/18)          EDD:   01/01/19 ---------------------------------------------------------------------- Anatomy (Fetus A)  Cranium:               Appears normal         Aortic Arch:            Previously seen  Cavum:                 Appears normal         Ductal Arch:            Previously seen  Ventricles:            Previously seen        Diaphragm:              Appears normal  Choroid Plexus:        Previously seen        Stomach:                Appears normal, left                                                                        sided  Cerebellum:            Previously seen        Abdomen:                Appears normal  Posterior Fossa:       Previously seen        Abdominal Wall:  Previously seen  Nuchal Fold:            Previously seen        Cord Vessels:           Not well visualized  Face:                  Appears normal         Kidneys:                Appear normal                         (orbits and profile)  Lips:                  Previously seen        Bladder:                Appears normal  Thoracic:              Previously seen        Spine:                  Previously seen  Heart:                 Previously seen        Upper Extremities:      Previously seen  RVOT:                  Previously seen        Lower Extremities:      Previously seen  LVOT:                  Previously seen  Other:  Female gender ---------------------------------------------------------------------- Doppler - Fetal Vessels (Fetus A)  Umbilical Artery   S/D     %tile  2.44       27 ---------------------------------------------------------------------- Fetal Evaluation (Fetus B)  Num Of Fetuses:         2  Fetal Heart Rate(bpm):  145  Cardiac Activity:       Observed  Presentation:           Cephalic  Placenta:               Anterior  P. Cord Insertion:      Previously Visualized  Amniotic Fluid  AFI FV:      Within normal limits  AFI Sum(cm)     %Tile       Largest Pocket(cm)  9.6             11          2.64  RUQ(cm)       RLQ(cm)       LUQ(cm)        LLQ(cm)  2.62          2.64          2.08           2.26 ---------------------------------------------------------------------- Biometry (Fetus B)  BPD:      76.8  mm     G. Age:  30w 6d         50  %    CI:        78.59   %    70 - 86  FL/HC:      20.9   %    19.2 - 21.4  HC:       274   mm     G. Age:  29w 6d          8  %    HC/AC:      1.13        0.99 - 1.21  AC:      242.2  mm     G. Age:  28w 3d          5  %    FL/BPD:     74.5   %    71 - 87  FL:       57.2  mm     G. Age:  30w 0d         24  %    FL/AC:      23.6   %    20 - 24  HUM:      53.1  mm     G. Age:  31w 0d         62  %  Est. FW:    1367  gm           3 lb      9  %      FW Discordancy      0 \ 2 % ---------------------------------------------------------------------- Gestational Age (Fetus B)  LMP:           30w 3d        Date:  03/27/18                 EDD:   01/01/19  U/S Today:     29w 6d                                        EDD:   01/05/19  Best:          30w 3d     Det. By:  LMP  (03/27/18)          EDD:   01/01/19 ---------------------------------------------------------------------- Anatomy (Fetus B)  Cranium:               Appears normal         Aortic Arch:            Previously seen  Cavum:                 Appears normal         Ductal Arch:            Previously seen  Ventricles:            Previously seen        Diaphragm:              Appears normal  Choroid Plexus:        Previously seen        Stomach:                Appears normal, left  sided  Cerebellum:            Previously seen        Abdomen:                Appears normal  Posterior Fossa:       Previously seen        Abdominal Wall:         Previously seen  Nuchal Fold:           Previously seen        Cord Vessels:           Previously seen  Face:                  Appears normal         Kidneys:                Appear normal                         (orbits and profile)  Lips:                  Previously seen        Bladder:                Appears normal  Thoracic:              Previously seen        Spine:                  Previously seen  Heart:                 Appears normal         Upper Extremities:      Previously seen                         (4CH, axis, and                         situs)  RVOT:                  Appears normal         Lower Extremities:      Previously seen  LVOT:                  Previously seen  Other:  Female gender ---------------------------------------------------------------------- Doppler - Fetal Vessels (Fetus B)  Umbilical Artery   S/D     %tile  2.49       30  ---------------------------------------------------------------------- Impression  Dichorionic-diamniotic twin pregnancy.  Patient returned for fetal growth assessment.  Twin A: Lower fetus, cephalic presentation, posterior  placenta. Head circumference (HC) measurement is at  between -1 and -2 SD. The estimated fetal weight is at the  7th percentile. Amniotic fluid is normal and good fetal activity  is seen. Umbilical artery Doppler study, performed because  of fetal growth restriction, showed normal forward diastolic  flow.  Twin B: Upper fetus, cephalic presentation, anterior placenta.  Head circumference (HC) measurement is at between -1 and  mean. The estimated fetal weight is at the 9th percentile.  Abdominal circumference measurement is at less than the  10th percentile. Amniotic fluid is normal and good fetal  activity is seen. Umbilical artery Doppler study, performed  because of fetal growth restriction, showed normal forward  diastolic flow.  Growth discordancy: 2% (normal).  Fetal weight is estimated by using Hadlock formula  (consistent with SMFM recommendation).  I explained the significance of fetal growth restriction that  requires close fetal surveillance. We will discuss timing of  delivery after next fetal growth assessment.  Her blood pressures at our office were 144/90 and 134/81  mm Hg. She does not have symptoms of severe features of  preeclampsia. I informed the patient that preeclampsia is  more common in twin pregnancies and fetal growth restriction  can precede preeclampsia in some cases. Patient takes low-  dose aspirin daily. ---------------------------------------------------------------------- Recommendations  -Weekly BPP, NST and umbilical artery Doppler studies till  delivery.  -Fetal growth assessment in 3 weeks. ----------------------------------------------------------------------                  Noralee Space, MD Electronically Signed Final Report   10/26/2018 05:14 pm  ----------------------------------------------------------------------  [place] Mfm Ua Cord Doppler  Result Date: 11/09/2018 ----------------------------------------------------------------------  OBSTETRICS REPORT                       (Signed Final 11/09/2018 04:50 pm) ---------------------------------------------------------------------- Patient Info  ID #:       [id number]                          D.O.B.:  [birth year] (23 yrs)  Name:       MCKINNLEY COTTIER Rabinovich               Visit Date: 11/09/2018 04:03 pm ---------------------------------------------------------------------- Performed By  Performed By:     Marcellina Millin          Ref. Address:     [address]                    [place]                                                             [address]  Attending:        Noralee Space MD        [facility]  Referred By:      [facility] ---------------------------------------------------------------------- Orders   #  Description                          Code         Ordered By   1  [place] MFM FETAL BPP WO NON              76819.01     RAVI SHANKAR      STRESS   2  [place] MFM UA CORD DOPPLER               83151.76     RAVI SHANKAR   3  [place] MFM UA ADDL GEST  76820.01     RAVI SHANKAR   4  [place] MFM FETAL BPP WO NST              16109.6      RAVI [facility]      ADDL GESTATION  ----------------------------------------------------------------------   #  Order #                    Accession #                 Episode #   1  [id number]                  [id number]                  [id number]   2  [id number]                  [id number]                  [id number]   3  [id number]                  [id number]                  [id number]   4  [id number]                  [id number]                  [id number]  ---------------------------------------------------------------------- Indications   Maternal care for known or suspected poor      O36.5930    fetal growth, third trimester, not applicable or   unspecified   [redacted] weeks gestation of pregnancy                Z3A.54   Twin pregnancy, di/di, third trimester         O30.043  ---------------------------------------------------------------------- Vital Signs                                                 Height:        5'0" ---------------------------------------------------------------------- Fetal Evaluation (Fetus A)  Num Of Fetuses:         2  Fetal Heart Rate(bpm):  130  Cardiac Activity:       Observed  Fetal Lie:              Lower Fetus  Presentation:           Cephalic  Placenta:               Posterior  Membrane Desc:      Dividing Membrane seen - Dichorionic.  Amniotic Fluid  AFI FV:      Within normal limits                              Largest Pocket(cm)                              3.5 ---------------------------------------------------------------------- Biophysical Evaluation (Fetus A)  Amniotic F.V:   Within normal limits       F. Tone:        Observed  F. Movement:    Observed  Score:          8/8  F. Breathing:   Observed ---------------------------------------------------------------------- OB History  Gravidity:    1         Term:   0        Prem:   0        SAB:   0  TOP:          0       Ectopic:  0        Living: 0 ---------------------------------------------------------------------- Gestational Age (Fetus A)  LMP:           32w 3d        Date:  03/27/18                 EDD:   01/01/19  Best:          [name]32w 3d     Det. By:  LMP  (03/27/18)          EDD:   01/01/19 ---------------------------------------------------------------------- Anatomy (Fetus A)  Stomach:               Appears normal, left   Bladder:                Appears normal                         sided ---------------------------------------------------------------------- Doppler - Fetal Vessels (Fetus A)  Umbilical Artery   S/D     %tile  2.93       67  ---------------------------------------------------------------------- Fetal Evaluation (Fetus B)  Num Of Fetuses:         2  Fetal Heart Rate(bpm):  140  Cardiac Activity:       Observed  Fetal Lie:              Upper Fetus  Presentation:           Cephalic  Placenta:               Anterior  Membrane Desc:      Dividing Membrane seen - Dichorionic.  Amniotic Fluid  AFI FV:      Within normal limits                              Largest Pocket(cm)                              5 ---------------------------------------------------------------------- Biophysical Evaluation (Fetus B)  Amniotic F.V:   Within normal limits       F. Tone:        Observed  F. Movement:    Observed                   Score:          8/8  F. Breathing:   Observed ---------------------------------------------------------------------- Gestational Age (Fetus B)  LMP:           32w 3d        Date:  03/27/18                 EDD:   01/01/19  Best:          [name]32w 3d     Det. By:  LMP  (03/27/18)          EDD:   01/01/19 ---------------------------------------------------------------------- Anatomy (Fetus  B)  Stomach:               Appears normal, left   Bladder:                Appears normal                         sided ---------------------------------------------------------------------- Doppler - Fetal Vessels (Fetus B)  Umbilical Artery   S/D     %tile                                            ADFV    RDFV  2.31       28                                                No      No ---------------------------------------------------------------------- Impression  Twin A: Lower fetus, cephalic, posterior placenta. Amniotic  fluid is normal and good fetal activity is seen. Umbilical artery  Doppler showed normal forward diastolic flow. Antenatal  testing is reassuring. BPP 8/8.  Twin B: Upper fetus, cephalic, anterior placenta. Amniotic  fluid is normal and good fetal activity is seen. Umbilical artery  Doppler showed normal forward diastolic flow.  Antenatal  testing is reassuring. BPP 8/8.  We reassured the patient of the findings. ---------------------------------------------------------------------- Recommendations  -Continue weekly BPP and Doppler till delivery.  -Fetal growth assessment in 2 weeks. ----------------------------------------------------------------------                  Noralee Space, MD Electronically Signed Final Report   11/09/2018 04:50 pm ----------------------------------------------------------------------  [place] Mfm Ua Cord Doppler  Result Date: 10/26/2018 ----------------------------------------------------------------------  OBSTETRICS REPORT                       (Signed Final 10/26/2018 05:14 pm) ---------------------------------------------------------------------- Patient Info  ID #:       [id number]                          D.O.B.:  May 21, 1995 (23 yrs)  Name:       KYLIEGH JESTER Knoblock               Visit Date: 10/26/2018 12:54 pm ---------------------------------------------------------------------- Performed By  Performed By:     Sandi Mealy        Ref. Address:     [address]                    [place]                                                             [address]  Attending:        Noralee Space MD        [facility]  Referred By:      [facility] ---------------------------------------------------------------------- Orders   #  Description                          Code         Ordered By   1  [place] MFM OB FOLLOW UP                  29562.13     Lin Landsman   2  [place] MFM OB FOLLOW UP ADDL             08657.84     CORENTHIAN      GEST                                              BOOKER   3  [place] MFM UA CORD DOPPLER               76820.02     CORENTHIAN                                                        BOOKER   4  [place] MFM UA ADDL GEST                  69629.52      Lin Landsman  ----------------------------------------------------------------------   #  Order #                    Accession #                 Episode #   1  [id number]                  [id number]                  [id number]   2  [id number]                  [id number]                  [id number]   3  [id number]                  [id number]                  [id number]   4  [id number]                  [id number]  [id number]  ---------------------------------------------------------------------- Indications   Encounter for other antenatal screening        Z36.2   follow-up ( Low Risk NIPS)   Twin pregnancy, di/di, third trimester         O30.043   [redacted] weeks gestation of pregnancy                Z3A.30   Maternal care for known or suspected poor      O36.5930   fetal growth, third trimester, not applicable or   unspecified  ---------------------------------------------------------------------- Vital Signs  Weight (lb): 178                               Height:        5'0"  BMI:         34.76  BP:          134/81 ---------------------------------------------------------------------- Fetal Evaluation (Fetus A)  Num Of Fetuses:         2  Fetal Heart Rate(bpm):  151  Cardiac Activity:       Observed  Presentation:           Cephalic  Placenta:               Posterior  P. Cord Insertion:      Previously Visualized  Amniotic Fluid  AFI FV:      Within normal limits  AFI Sum(cm)     %Tile       Largest Pocket(cm)  11.85           29          6.13  RUQ(cm)       RLQ(cm)       LUQ(cm)        LLQ(cm)  2.28          0.03          6.13           3.41 ---------------------------------------------------------------------- Biometry (Fetus A)  BPD:      67.6  mm     G. Age:  27w 2d        < 1  %    CI:        67.74   %    70 - 86                                                          FL/HC:      21.7   %    19.2 - 21.4  HC:      262.8  mm     G. Age:  28w 4d         < 1  %    HC/AC:      1.06        0.99 - 1.21  AC:      247.8  mm     G. Age:  29w 0d         11  %    FL/BPD:     84.2   %    71 - 87  FL:       56.9  mm     G. Age:  29w 6d  21  %    FL/AC:      23.0   %    20 - 24  HUM:      49.9  mm     G. Age:  29w 2d         28  %  Est. FW:    1338  gm    2 lb 15 oz       7  %     FW Discordancy         2  % ---------------------------------------------------------------------- OB History  Gravidity:    1         Term:   0        Prem:   0        SAB:   0  TOP:          0       Ectopic:  0        Living: 0 ---------------------------------------------------------------------- Gestational Age (Fetus A)  LMP:           30w 3d        Date:  03/27/18                 EDD:   01/01/19  U/S Today:     28w 5d                                        EDD:   01/13/19  Best:          30w 3d     Det. By:  LMP  (03/27/18)          EDD:   01/01/19 ---------------------------------------------------------------------- Anatomy (Fetus A)  Cranium:               Appears normal         Aortic Arch:            Previously seen  Cavum:                 Appears normal         Ductal Arch:            Previously seen  Ventricles:            Previously seen        Diaphragm:              Appears normal  Choroid Plexus:        Previously seen        Stomach:                Appears normal, left                                                                        sided  Cerebellum:            Previously seen        Abdomen:                Appears normal  Posterior Fossa:       Previously seen        Abdominal Wall:  Previously seen  Nuchal Fold:           Previously seen        Cord Vessels:           Not well visualized  Face:                  Appears normal         Kidneys:                Appear normal                         (orbits and profile)  Lips:                  Previously seen        Bladder:                Appears normal  Thoracic:              Previously seen        Spine:                   Previously seen  Heart:                 Previously seen        Upper Extremities:      Previously seen  RVOT:                  Previously seen        Lower Extremities:      Previously seen  LVOT:                  Previously seen  Other:  Female gender ---------------------------------------------------------------------- Doppler - Fetal Vessels (Fetus A)  Umbilical Artery   S/D     %tile  2.44       27 ---------------------------------------------------------------------- Fetal Evaluation (Fetus B)  Num Of Fetuses:         2  Fetal Heart Rate(bpm):  145  Cardiac Activity:       Observed  Presentation:           Cephalic  Placenta:               Anterior  P. Cord Insertion:      Previously Visualized  Amniotic Fluid  AFI FV:      Within normal limits  AFI Sum(cm)     %Tile       Largest Pocket(cm)  9.6             11          2.64  RUQ(cm)       RLQ(cm)       LUQ(cm)        LLQ(cm)  2.62          2.64          2.08           2.26 ---------------------------------------------------------------------- Biometry (Fetus B)  BPD:      76.8  mm     G. Age:  30w 6d         50  %    CI:        78.59   %    70 - 86  FL/HC:      20.9   %    19.2 - 21.4  HC:       274   mm     G. Age:  29w 6d          8  %    HC/AC:      1.13        0.99 - 1.21  AC:      242.2  mm     G. Age:  28w 3d          5  %    FL/BPD:     74.5   %    71 - 87  FL:       57.2  mm     G. Age:  30w 0d         24  %    FL/AC:      23.6   %    20 - 24  HUM:      53.1  mm     G. Age:  31w 0d         62  %  Est. FW:    1367  gm           3 lb      9  %     FW Discordancy      0 \ 2 % ---------------------------------------------------------------------- Gestational Age (Fetus B)  LMP:           30w 3d        Date:  03/27/18                 EDD:   01/01/19  U/S Today:     29w 6d                                        EDD:   01/05/19  Best:          30w 3d     Det. By:  LMP  (03/27/18)          EDD:    01/01/19 ---------------------------------------------------------------------- Anatomy (Fetus B)  Cranium:               Appears normal         Aortic Arch:            Previously seen  Cavum:                 Appears normal         Ductal Arch:            Previously seen  Ventricles:            Previously seen        Diaphragm:              Appears normal  Choroid Plexus:        Previously seen        Stomach:                Appears normal, left  sided  Cerebellum:            Previously seen        Abdomen:                Appears normal  Posterior Fossa:       Previously seen        Abdominal Wall:         Previously seen  Nuchal Fold:           Previously seen        Cord Vessels:           Previously seen  Face:                  Appears normal         Kidneys:                Appear normal                         (orbits and profile)  Lips:                  Previously seen        Bladder:                Appears normal  Thoracic:              Previously seen        Spine:                  Previously seen  Heart:                 Appears normal         Upper Extremities:      Previously seen                         (4CH, axis, and                         situs)  RVOT:                  Appears normal         Lower Extremities:      Previously seen  LVOT:                  Previously seen  Other:  Female gender ---------------------------------------------------------------------- Doppler - Fetal Vessels (Fetus B)  Umbilical Artery   S/D     %tile  2.49       30 ---------------------------------------------------------------------- Impression  Dichorionic-diamniotic twin pregnancy.  Patient returned for fetal growth assessment.  Twin A: Lower fetus, cephalic presentation, posterior  placenta. Head circumference (HC) measurement is at  between -1 and -2 SD. The estimated fetal weight is at the  7th percentile. Amniotic fluid is normal and good fetal  activity  is seen. Umbilical artery Doppler study, performed because  of fetal growth restriction, showed normal forward diastolic  flow.  Twin B: Upper fetus, cephalic presentation, anterior placenta.  Head circumference (HC) measurement is at between -1 and  mean. The estimated fetal weight is at the 9th percentile.  Abdominal circumference measurement is at less than the  10th percentile. Amniotic fluid is normal and good fetal  activity is seen. Umbilical artery Doppler study, performed  because of fetal growth restriction, showed normal forward  diastolic flow.  Growth discordancy: 2% (normal).  Fetal weight is estimated by using Hadlock formula  (consistent with SMFM recommendation).  I explained the significance of fetal growth restriction that  requires close fetal surveillance. We will discuss timing of  delivery after next fetal growth assessment.  Her blood pressures at our office were 144/90 and 134/81  mm Hg. She does not have symptoms of severe features of  preeclampsia. I informed the patient that preeclampsia is  more common in twin pregnancies and fetal growth restriction  can precede preeclampsia in some cases. Patient takes low-  dose aspirin daily. ---------------------------------------------------------------------- Recommendations  -Weekly BPP, NST and umbilical artery Doppler studies till  delivery.  -Fetal growth assessment in 3 weeks. ----------------------------------------------------------------------                  Noralee Space, MD Electronically Signed Final Report   10/26/2018 05:14 pm ----------------------------------------------------------------------  [place] Mfm Fetal Bpp Wo Nst Addl Gestation  Result Date: 11/09/2018 ----------------------------------------------------------------------  OBSTETRICS REPORT                       (Signed Final 11/09/2018 04:50 pm) ---------------------------------------------------------------------- Patient Info  ID #:       [id number]                           D.O.B.:  1995-08-09 (23 yrs)  Name:       Linda Berry               Visit Date: 11/09/2018 04:03 pm ---------------------------------------------------------------------- Performed By  Performed By:     Marcellina Millin          Ref. Address:     [address]                    [place]                                                             [address]  Attending:        Noralee Space MD        [facility]  Referred By:      [facility] ---------------------------------------------------------------------- Orders   #  Description                          Code         Ordered By   1  [place] MFM FETAL BPP WO NON              76819.01     RAVI SHANKAR      STRESS   2  [place] MFM UA CORD DOPPLER               40981.19     RAVI SHANKAR   3  [place] MFM UA ADDL  GEST                  76820.01     RAVI SHANKAR   4  [place] MFM FETAL BPP WO NST              60454.0      RAVI [facility]      ADDL GESTATION  ----------------------------------------------------------------------   #  Order #                    Accession #                 Episode #   1  [id number]                  [id number]   2  [id number]                  [id number]                  [id number]   3  [id number]                  [id number]                  [id number]   4  [id number]                  [id number]                  [id number]  ---------------------------------------------------------------------- Indications   Maternal care for known or suspected poor      O36.5930   fetal growth, third trimester, not applicable or   unspecified   [redacted] weeks gestation of pregnancy                Z3A.53   Twin pregnancy, di/di, third trimester         O30.043  ---------------------------------------------------------------------- Vital Signs                                                 Height:        5'0"  ---------------------------------------------------------------------- Fetal Evaluation (Fetus A)  Num Of Fetuses:         2  Fetal Heart Rate(bpm):  130  Cardiac Activity:       Observed  Fetal Lie:              Lower Fetus  Presentation:           Cephalic  Placenta:               Posterior  Membrane Desc:      Dividing Membrane seen - Dichorionic.  Amniotic Fluid  AFI FV:      Within normal limits                              Largest Pocket(cm)                              3.5 ---------------------------------------------------------------------- Biophysical Evaluation (Fetus A)  Amniotic F.V:   Within normal limits       F. Tone:  Observed  F. Movement:    Observed                   Score:          8/8  F. Breathing:   Observed ---------------------------------------------------------------------- OB History  Gravidity:    1         Term:   0        Prem:   0        SAB:   0  TOP:          0       Ectopic:  0        Living: 0 ---------------------------------------------------------------------- Gestational Age (Fetus A)  LMP:           32w 3d        Date:  03/27/18                 EDD:   01/01/19  Best:          [name] 3d     Det. By:  LMP  (03/27/18)          EDD:   01/01/19 ---------------------------------------------------------------------- Anatomy (Fetus A)  Stomach:               Appears normal, left   Bladder:                Appears normal                         sided ---------------------------------------------------------------------- Doppler - Fetal Vessels (Fetus A)  Umbilical Artery   S/D     %tile  2.93       67 ---------------------------------------------------------------------- Fetal Evaluation (Fetus B)  Num Of Fetuses:         2  Fetal Heart Rate(bpm):  140  Cardiac Activity:       Observed  Fetal Lie:              Upper Fetus  Presentation:           Cephalic  Placenta:               Anterior  Membrane Desc:      Dividing Membrane seen - Dichorionic.  Amniotic Fluid  AFI FV:       Within normal limits                              Largest Pocket(cm)                              5 ---------------------------------------------------------------------- Biophysical Evaluation (Fetus B)  Amniotic F.V:   Within normal limits       F. Tone:        Observed  F. Movement:    Observed                   Score:          8/8  F. Breathing:   Observed ---------------------------------------------------------------------- Gestational Age (Fetus B)  LMP:           32w 3d        Date:  03/27/18                 EDD:   01/01/19  Best:          [name] 3d  Det. By:  LMP  (03/27/18)          EDD:   01/01/19 ---------------------------------------------------------------------- Anatomy (Fetus B)  Stomach:               Appears normal, left   Bladder:                Appears normal                         sided ---------------------------------------------------------------------- Doppler - Fetal Vessels (Fetus B)  Umbilical Artery   S/D     %tile                                            ADFV    RDFV  2.31       28                                                No      No ---------------------------------------------------------------------- Impression  Twin A: Lower fetus, cephalic, posterior placenta. Amniotic  fluid is normal and good fetal activity is seen. Umbilical artery  Doppler showed normal forward diastolic flow. Antenatal  testing is reassuring. BPP 8/8.  Twin B: Upper fetus, cephalic, anterior placenta. Amniotic  fluid is normal and good fetal activity is seen. Umbilical artery  Doppler showed normal forward diastolic flow. Antenatal  testing is reassuring. BPP 8/8.  We reassured the patient of the findings. ---------------------------------------------------------------------- Recommendations  -Continue weekly BPP and Doppler till delivery.  -Fetal growth assessment in 2 weeks. ----------------------------------------------------------------------                  Noralee Space, MD Electronically Signed  Final Report   11/09/2018 04:50 pm ----------------------------------------------------------------------   Assessment and Plan:  Pregnancy: G1P0 at [redacted]w[redacted]d 1. Dichorionic diamniotic twin pregnancy in third trimester 2. Intrauterine growth restriction (IUGR) affecting care of mother, third trimester, single or unspecified fetus Followed by MFM, continue serial scans and antenatal testing. Will schedule IOL next visit.  3. Supervision of high risk pregnancy, antepartum Preterm labor symptoms and general obstetric precautions including but not limited to vaginal bleeding, contractions, leaking of fluid and fetal movement were reviewed in detail with the patient. Please refer to After Visit Summary for other counseling recommendations.   Return in about 2 weeks (around 11/30/2018) for Virtual OB Visit   3 Weeks: OFFICE HOB Visit, Pelvic cultures.  Future Appointments  Date Time Provider Department Center  11/16/2018  1:45 PM [facility] NURSE [facility]  11/16/2018  1:45 PM [facility] [place] 2 WH-MFCUS MFC-US  11/24/2018  2:00 PM [facility] NURSE [facility]FC MFC-US  11/24/2018  2:00 PM [facility] [place] 3 WH-MFCUS MFC-US  11/30/2018  1:35 PM Conan Bowens, MD [facility] WOC  12/07/2018 11:15 AM Adam Phenix, MD [facility]OC    Jaynie Collins, MD

## 2018-11-24 ENCOUNTER — Other Ambulatory Visit: Payer: Self-pay

## 2018-11-24 ENCOUNTER — Encounter (HOSPITAL_COMMUNITY): Payer: Self-pay | Admitting: *Deleted

## 2018-11-24 ENCOUNTER — Other Ambulatory Visit (HOSPITAL_COMMUNITY): Payer: Self-pay | Admitting: *Deleted

## 2018-11-24 ENCOUNTER — Ambulatory Visit (HOSPITAL_COMMUNITY)
Admission: RE | Admit: 2018-11-24 | Discharge: 2018-11-24 | Disposition: A | Discharge status: Home / Self Care | Payer: Medicaid Other | Source: Ambulatory Visit | Attending: Obstetrics and Gynecology | Admitting: Obstetrics and Gynecology

## 2018-11-24 ENCOUNTER — Ambulatory Visit (HOSPITAL_COMMUNITY): Payer: Medicaid Other | Admitting: *Deleted

## 2018-11-24 VITALS — BP 132/77 | HR 87 | Temp 98.0°F

## 2018-11-24 DIAGNOSIS — Z3A34 34 weeks gestation of pregnancy: Secondary | ICD-10-CM

## 2018-11-24 DIAGNOSIS — O30049 Twin pregnancy, dichorionic/diamniotic, unspecified trimester: Secondary | ICD-10-CM | POA: Insufficient documentation

## 2018-11-24 DIAGNOSIS — O365930 Maternal care for other known or suspected poor fetal growth, third trimester, not applicable or unspecified: Secondary | ICD-10-CM

## 2018-11-24 DIAGNOSIS — O30043 Twin pregnancy, dichorionic/diamniotic, third trimester: Secondary | ICD-10-CM

## 2018-11-24 DIAGNOSIS — O365990 Maternal care for other known or suspected poor fetal growth, unspecified trimester, not applicable or unspecified: Secondary | ICD-10-CM | POA: Diagnosis not present

## 2018-11-30 ENCOUNTER — Encounter: Payer: Self-pay | Admitting: Obstetrics and Gynecology

## 2018-11-30 ENCOUNTER — Other Ambulatory Visit: Payer: Self-pay

## 2018-11-30 ENCOUNTER — Telehealth (INDEPENDENT_AMBULATORY_CARE_PROVIDER_SITE_OTHER): Payer: Medicaid Other | Admitting: Obstetrics and Gynecology

## 2018-11-30 VITALS — BP 143/90 | HR 89

## 2018-11-30 DIAGNOSIS — O30043 Twin pregnancy, dichorionic/diamniotic, third trimester: Secondary | ICD-10-CM

## 2018-11-30 DIAGNOSIS — O0993 Supervision of high risk pregnancy, unspecified, third trimester: Secondary | ICD-10-CM

## 2018-11-30 DIAGNOSIS — Z3A35 35 weeks gestation of pregnancy: Secondary | ICD-10-CM

## 2018-11-30 DIAGNOSIS — O365932 Maternal care for other known or suspected poor fetal growth, third trimester, fetus 2: Secondary | ICD-10-CM

## 2018-11-30 DIAGNOSIS — O0990 Supervision of high risk pregnancy, unspecified, unspecified trimester: Secondary | ICD-10-CM

## 2018-11-30 DIAGNOSIS — R030 Elevated blood-pressure reading, without diagnosis of hypertension: Secondary | ICD-10-CM

## 2018-11-30 NOTE — Progress Notes (Signed)
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 States later today boyfriend can retake & she will enter it into [place]

## 2018-11-30 NOTE — Progress Notes (Signed)
TELEHEALTH OBSTETRICS PRENATAL VIRTUAL VIDEO VISIT ENCOUNTER NOTE  [facility]   I connected with Linda HelperShentaria T Kozak on 11/30/18 at  1:35 PM EDT by MyChart Video Encounter at home and verified that I am speaking with the correct person using two identifiers.   I discussed the limitations, risks, security and privacy concerns of performing an evaluation and management service virtually and the availability of in person appointments. I also discussed with the patient that there may be a patient responsible charge related to this service. The patient expressed understanding and agreed to proceed. Subjective:  Linda Berry is a 23 y.o. G1P0 at 3584w3d being seen today for ongoing prenatal care.  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  Patient reports no complaints but she is tired and having trouble sleeping.  Contractions: Not present. Vag. Bleeding: None.  Movement: Present. Denies any leaking of fluid.   The following portions of the patient's history were reviewed and updated as appropriate: allergies, current medications, past family history, past medical history, past social history, past surgical history and problem list.   Objective:   Vitals:   11/30/18 1335  BP: (!) 143/90  Pulse: 89    Fetal Status:     Movement: Present     General:  Alert, oriented and cooperative. Patient is in no acute distress.  Respiratory: Normal respiratory effort, no problems with respiration noted  Mental Status: Normal mood and affect. Normal behavior. Normal judgment and thought content.  Rest of physical  exam deferred due to type of encounter  Imaging:   Assessment and Plan:  Pregnancy: G1P0 at 5384w3d  1. Supervision of high risk pregnancy, antepartum  2. Dichorionic diamniotic twin pregnancy in third trimester See below Currently vtx/vtx Reviewed induction of labor with vaginal delivery, if malpresentation, would recommend C-section, reviewed possibility of vaginal breech extraction, patient verbalizes understanding of the above and desires to proceed with induction  3. 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 Intrauterine growth restriction (IUGR) affecting care of mother, third trimester, fetus 2 A: 17th%til B: 5th%tile Recommended for delivery at 36 weeks by MFM, which is in 4 days, patient frustrated that she is being recommended for delivery this early and she was not aware, states nobody has mentioned this to her at all, had extended conversation regarding recommendation and reasoning behind it, risks of delaying induction including fetal morbidity, she wants to speak with her boyfriend and will call with any questions, will plan for IOL at 36 weeks at this point, requested today - orders for induction placed   5. Elevated blood pressure reading without diagnosis of hypertension BP 143/90 today in left arm (paralyzed arm) , states this is not usually the arm her BP is taken in - will have boyfriend take BP when she gets home   Preterm labor symptoms and general obstetric precautions including but not limited to vaginal bleeding, contractions, leaking of fluid and fetal movement were reviewed in detail with the patient. I discussed the assessment and treatment plan with the patient. The patient was provided an opportunity to ask questions and all were answered. The patient  agreed with the plan and demonstrated an understanding of the instructions. The patient was advised to call back or seek an in-person office  evaluation/go to MAU at [facility] for any urgent or concerning symptoms. Please refer to After Visit Summary for other counseling recommendations.   I provided 22 minutes of face-to-face time during this encounter.  Return in about 5 weeks (around 01/04/2019) for post partum check.  Future Appointments  Date Time Provider [place]  12/01/2018  1:30 PM [place] [facility] MFC-US  12/01/2018  1:30 PM [employer] [place] 5 WH-MFCUS MFC-US  12/07/2018 11:15 AM [place] Mode, MD WOC-WOCA WOC  12/08/2018  1:00 PM [place] NURSE [place] MFC-US  12/08/2018  1:00 PM WH-MFC [place] 3 WH-MFCUS MFC-US    Lyndel Sarate M Nevaen [place], [place] for [place], [place]

## 2018-12-01 ENCOUNTER — Other Ambulatory Visit: Payer: Self-pay

## 2018-12-01 ENCOUNTER — Ambulatory Visit (HOSPITAL_COMMUNITY)
Admission: RE | Admit: 2018-12-01 | Discharge: 2018-12-01 | Disposition: A | Discharge status: Home / Self Care | Payer: Medicaid Other | Source: Ambulatory Visit | Attending: Obstetrics and Gynecology | Admitting: Obstetrics and Gynecology

## 2018-12-01 ENCOUNTER — Encounter (HOSPITAL_COMMUNITY): Payer: Self-pay | Admitting: *Deleted

## 2018-12-01 ENCOUNTER — Telehealth (HOSPITAL_COMMUNITY): Payer: Self-pay | Admitting: *Deleted

## 2018-12-01 ENCOUNTER — Encounter (HOSPITAL_COMMUNITY): Payer: Self-pay

## 2018-12-01 ENCOUNTER — Ambulatory Visit (HOSPITAL_COMMUNITY): Payer: Medicaid Other | Admitting: *Deleted

## 2018-12-01 VITALS — BP 141/84 | HR 105 | Temp 98.1°F

## 2018-12-01 DIAGNOSIS — O30049 Twin pregnancy, dichorionic/diamniotic, unspecified trimester: Secondary | ICD-10-CM

## 2018-12-01 DIAGNOSIS — Z3A35 35 weeks gestation of pregnancy: Secondary | ICD-10-CM | POA: Diagnosis not present

## 2018-12-01 DIAGNOSIS — O365930 Maternal care for other known or suspected poor fetal growth, third trimester, not applicable or unspecified: Secondary | ICD-10-CM | POA: Insufficient documentation

## 2018-12-01 DIAGNOSIS — O30043 Twin pregnancy, dichorionic/diamniotic, third trimester: Secondary | ICD-10-CM | POA: Diagnosis not present

## 2018-12-01 NOTE — Progress Notes (Signed)
BP recheck: 146/87, 140/85. Dr. Donalee Citrin reviewed.

## 2018-12-01 NOTE — Telephone Encounter (Signed)
Preadmission screen  

## 2018-12-02 ENCOUNTER — Encounter (HOSPITAL_COMMUNITY): Payer: Self-pay

## 2018-12-02 ENCOUNTER — Other Ambulatory Visit: Payer: Self-pay

## 2018-12-02 ENCOUNTER — Other Ambulatory Visit (HOSPITAL_COMMUNITY)
Admission: RE | Admit: 2018-12-02 | Discharge: 2018-12-02 | Disposition: A | Discharge status: Home / Self Care | Payer: Medicaid Other | Source: Ambulatory Visit | Attending: Obstetrics and Gynecology | Admitting: Obstetrics and Gynecology

## 2018-12-02 ENCOUNTER — Inpatient Hospital Stay (EMERGENCY_DEPARTMENT_HOSPITAL)
Admission: AD | Admit: 2018-12-02 | Discharge: 2018-12-03 | Disposition: A | Discharge status: Home / Self Care | Payer: Medicaid Other | Source: Home / Self Care | Attending: Obstetrics & Gynecology | Admitting: Obstetrics & Gynecology

## 2018-12-02 DIAGNOSIS — O1493 Unspecified pre-eclampsia, third trimester: Secondary | ICD-10-CM | POA: Diagnosis present

## 2018-12-02 DIAGNOSIS — Z01812 Encounter for preprocedural laboratory examination: Secondary | ICD-10-CM | POA: Insufficient documentation

## 2018-12-02 DIAGNOSIS — Z87891 Personal history of nicotine dependence: Secondary | ICD-10-CM | POA: Insufficient documentation

## 2018-12-02 DIAGNOSIS — Z20828 Contact with and (suspected) exposure to other viral communicable diseases: Secondary | ICD-10-CM | POA: Diagnosis not present

## 2018-12-02 DIAGNOSIS — Z3A35 35 weeks gestation of pregnancy: Secondary | ICD-10-CM | POA: Insufficient documentation

## 2018-12-02 DIAGNOSIS — O30003 Twin pregnancy, unspecified number of placenta and unspecified number of amniotic sacs, third trimester: Secondary | ICD-10-CM | POA: Insufficient documentation

## 2018-12-02 DIAGNOSIS — O30043 Twin pregnancy, dichorionic/diamniotic, third trimester: Secondary | ICD-10-CM

## 2018-12-02 LAB — SARS CORONAVIRUS 2 (TAT 6-24 HRS): SARS Coronavirus 2: NEGATIVE

## 2018-12-02 NOTE — MAU Note (Signed)
Covid swab collected.PT asymptomatic. PT tolerated well

## 2018-12-02 NOTE — MAU Note (Signed)
Pt here with c/o hypertension at home; told to come in for evaluation. Twins pregnancy. Scheduled for induction on Friday. Denies any bleeding or leaking; reports good fetal movement. Denies headache and blurred vision; having some swelling in lower extremities.

## 2018-12-03 DIAGNOSIS — O30043 Twin pregnancy, dichorionic/diamniotic, third trimester: Secondary | ICD-10-CM | POA: Diagnosis not present

## 2018-12-03 DIAGNOSIS — Z3A35 35 weeks gestation of pregnancy: Secondary | ICD-10-CM

## 2018-12-03 DIAGNOSIS — O1493 Unspecified pre-eclampsia, third trimester: Secondary | ICD-10-CM | POA: Diagnosis not present

## 2018-12-03 LAB — CBC
HCT: 29.2 % — ABNORMAL LOW (ref 36.0–46.0)
Hemoglobin: 9.5 g/dL — ABNORMAL LOW (ref 12.0–15.0)
MCH: 29 pg (ref 26.0–34.0)
MCHC: 32.5 g/dL (ref 30.0–36.0)
MCV: 89 fL (ref 80.0–100.0)
Platelets: 171 10*3/uL (ref 150–400)
RBC: 3.28 MIL/uL — ABNORMAL LOW (ref 3.87–5.11)
RDW: 13.2 % (ref 11.5–15.5)
WBC: 10 10*3/uL (ref 4.0–10.5)
nRBC: 0 % (ref 0.0–0.2)

## 2018-12-03 LAB — COMPREHENSIVE METABOLIC PANEL
ALT: 12 U/L (ref 0–44)
AST: 18 U/L (ref 15–41)
Albumin: 3 g/dL — ABNORMAL LOW (ref 3.5–5.0)
Alkaline Phosphatase: 173 U/L — ABNORMAL HIGH (ref 38–126)
Anion gap: 12 (ref 5–15)
BUN: 6 mg/dL (ref 6–20)
CO2: 17 mmol/L — ABNORMAL LOW (ref 22–32)
Calcium: 9.1 mg/dL (ref 8.9–10.3)
Chloride: 104 mmol/L (ref 98–111)
Creatinine, Ser: 0.55 mg/dL (ref 0.44–1.00)
GFR calc Af Amer: >60 mL/min (ref >60)
GFR calc non Af Amer: >60 mL/min (ref >60)
Glucose, Bld: 79 mg/dL (ref 70–99)
Potassium: 3.6 mmol/L (ref 3.5–5.1)
Sodium: 133 mmol/L — ABNORMAL LOW (ref 135–145)
Total Bilirubin: 0.6 mg/dL (ref 0.3–1.2)
Total Protein: 6 g/dL — ABNORMAL LOW (ref 6.5–8.1)

## 2018-12-03 LAB — PROTEIN / CREATININE RATIO, URINE
Creatinine, Urine: 189.47 mg/dL
Protein Creatinine Ratio: 0.84 mg/mg{Cre} — ABNORMAL HIGH (ref 0.00–0.15)
Total Protein, Urine: 160 mg/dL

## 2018-12-03 MED ORDER — ONDANSETRON 4 MG PO TBDP: 4.0000 mg | ORAL_TABLET | Freq: Once | ORAL | Status: DC

## 2018-12-03 NOTE — MAU Provider Note (Signed)
History     CSN: [id number]  Arrival date and time: 12/02/18 2227   First Provider Initiated Contact with Patient 12/03/18 0025      Chief Complaint  Patient presents with  . Hypertension    Linda Berry is a 23 y.o. G1P0 at 6479w6d [facility]  She presents today for Hypertension.  She states she took her blood pressure at home and it was 150s/90s and 140s/90s.  She states that she is only taking baby aspirin and denies HA, visual disturbances, epigastric pain, and SOB.  She reports that she had some elevated bp yesterday in MFM, but refused to be seen in MAU that day.  She states she was told to monitor her blood pressures and report any increases. Patient reports occasional contractions and endorses fetal movement.  She denies vaginal concerns including discharge, bleeding, and leaking.       OB History    Gravida  1   Para      Term      Preterm      AB      Living  0     SAB      TAB      Ectopic      Multiple      Live Births              Past Medical History:  Diagnosis Date  . Asthma   . 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 Scoliosis     Past Surgical History:  Procedure Laterality Date  . arm surgery     brachial plexus  . WISDOM TOOTH EXTRACTION      Family History  Problem Relation Age of Onset  . Diabetes Mother   . Hypertension Mother   . Huntington's disease Mother   . Ulcers Father   . Cancer Father     Social History   Tobacco Use  . Smoking status: Former Smoker    Types: Cigarettes    Quit date: 05/09/2018    Years since quitting: 0.5  . Smokeless tobacco: Never Used  Substance Use Topics  . Alcohol use: Not Currently  . Drug use: Not Currently    Types: Marijuana    Comment: last use December    Allergies:  Allergies  Allergen Reactions  . Ammonia Hives    Medications Prior to Admission  Medication Sig Dispense Refill Last Dose  . albuterol (VENTOLIN HFA) 108 (90 Base) MCG/ACT  inhaler Inhale 2 puffs into the lungs every 6 (six) hours as needed for wheezing or shortness of breath. 1 Inhaler 12 12/01/2018 at Unknown time  . aspirin 81 MG tablet Take 1 tablet (81 mg total) by mouth daily. 30 tablet 11 12/02/2018 at Unknown time  . calcium carbonate (TUMS - DOSED IN MG ELEMENTAL CALCIUM) 500 MG chewable tablet Chew 1 tablet by mouth daily.   12/02/2018 at Unknown time  . diphenhydrAMINE (BENADRYL) 25 MG tablet Take 25 mg by mouth every 6 (six) hours as needed.   Past Week at Unknown time  . Menthol, Topical Analgesic, (BIOFREEZE EX) Apply 1 application topically daily as needed. For back pain   12/01/2018 at Unknown time  . Prenatal Vit-Fe Fumarate-FA (MULTIVITAMIN-PRENATAL) 27-0.8 MG TABS tablet Take 1 tablet by mouth daily at 12 noon.   12/02/2018 at Unknown time  . Elastic Bandages & Supports (WRIST SPLINT/COCK-UP/RIGHT SM) MISC 1 Units by Does not apply route at bedtime as needed. 1  each 0   . Misc. Devices (BREAST PUMP) MISC 1 Device by Does not apply route as needed. 1 each 0   . promethazine (PHENERGAN) 25 MG tablet Take 1 tablet (25 mg total) by mouth every 6 (six) hours as needed for nausea or vomiting. 30 tablet 0 More than a month at Unknown time  . SUMAtriptan (IMITREX) 100 MG tablet Take 1 tablet (100 mg total) by mouth once as needed for up to 1 dose for migraine. May repeat in 2 hours if headache persists or recurs. 20 tablet 11 More than a month at Unknown time    Review of Systems  Constitutional: Negative for chills and fever.  Respiratory: Negative for cough and shortness of breath.   Gastrointestinal: Negative for constipation, diarrhea, nausea and vomiting.  Genitourinary: Negative for difficulty urinating, dysuria, vaginal bleeding and vaginal discharge.  Neurological: Negative for dizziness, light-headedness and headaches.   Physical Exam   Blood pressure (!) 133/91, pulse 89, temperature 98.2 F (36.8 C), temperature source Oral, resp. rate 18, height 5'  (1.524 m), weight 91.2 kg, last menstrual period 03/27/2018, unknown if currently breastfeeding.  Vitals:   12/03/18 0015 12/03/18 0046  BP: (!) 143/89 124/85  Pulse: 81 85  Resp:    Temp:       Physical Exam  Constitutional: She is oriented to person, place, and time. She appears well-developed and well-nourished.  HENT:  Head: Normocephalic and atraumatic.  Eyes: Conjunctivae are normal.  Neck: Normal range of motion.  Cardiovascular: Normal rate, regular rhythm and normal heart sounds.  Respiratory: Effort normal and breath sounds normal.  GI: Soft.  Musculoskeletal: Normal range of motion.        General: Edema: +2 BLE.  Neurological: She is alert and oriented to person, place, and time.  Skin: Skin is warm and dry.  Psychiatric: She has a normal mood and affect. Her behavior is normal.    Fetal Assessment Baby A: 125 bpm, Mod Var, -Decels, +Accels Baby B: 120 bpm, Mod Var, -Decels, +Accels Toco: Irritability with occasional ctx.   MAU Course   Results for orders placed or performed during the hospital encounter of 12/02/18 (from the past 24 hour(s))  Protein / creatinine ratio, urine     Status: Abnormal   Collection Time: 12/03/18 12:35 AM  Result Value Ref Range   Creatinine, Urine 189.47 mg/dL   Total Protein, Urine 160 mg/dL   Protein Creatinine Ratio 0.84 (H) 0.00 - 0.15 mg/mg[Cre]  CBC     Status: Abnormal   Collection Time: 12/03/18  1:32 AM  Result Value Ref Range   WBC 10.0 4.0 - 10.5 K/uL   RBC 3.28 (L) 3.87 - 5.11 MIL/uL   Hemoglobin 9.5 (L) 12.0 - 15.0 g/dL   HCT 29.2 (L) 36.0 - 46.0 %   MCV 89.0 80.0 - 100.0 fL   MCH 29.0 26.0 - 34.0 pg   MCHC 32.5 30.0 - 36.0 g/dL   RDW 13.2 11.5 - 15.5 %   Platelets 171 150 - 400 K/uL   nRBC 0.0 0.0 - 0.2 %   No results found.  MDM PE Labs:CMP, CBC, PC Ratio,  EFM BP Q15Min  Assessment and Plan  23 year old G1P0  TIUP at 35.6weeks Cat I FT Elevated BP  -Exam findings discussed. -Informed of need  for testing for PreEclampsia. -Educated on PreEclampsia. -Discussed possibility of admission today for IOL if necessary. -Patient with questions regarding induction process. -Discussed r/b of induction including fetal distress,  serial induction, pain, and increased risk of c/s delivery -Discussed induction methods including cervical ripening agents, foley bulbs, and pitocin -Patient verbalizes understanding and has no further questions or concerns. -Labs drawn  -Will await results -NST reactive; Okay to discontinue EFM.  Cherre RobinsJessica L Cadel Stairs MSN, CNM 12/03/2018, 12:25 AM   Reassessment (1:29 AM)  -PC Ratio Pending -CBC/CMP not processed.  Lab called and states sample not received due to power failure. -Patient given option to wait to find sample in tube station or restick and patient opts for repeat labs.  Reassessment (1:59 AM) PreEclampsia  -PC Ratio returns at 0.84 and other labs pending -Dr. Despina HiddenEure consulted regarding admission vs discharge with plan for IOL tomorrow.  MD informed of patient status and PC Ratio. Advises okay for discharge and IOL tomorrow.   Reassessment (3:12 AM)   Vitals:   12/03/18 0200 12/03/18 0230  BP: 108/64 111/65  Pulse: 80 86  Resp:    Temp:      -BP remains normotensive. -Results and POC discussed with patient. -Instructed to go home, rest, and prepare for IOL tomorrow. -PreEclampsia precautions given. -Fetal Movement discussed. -Patient without questions or concerns.  -Encouraged to call or return to MAU if symptoms worsen or with the onset of new symptoms. -Discharged to home in stable condition.  Cherre RobinsJessica L Oline Belk MSN, CNM

## 2018-12-03 NOTE — Discharge Instructions (Signed)
Preeclampsia and Eclampsia °Preeclampsia is a serious condition that may develop during pregnancy. This condition causes high blood pressure and increased protein in your urine along with other symptoms, such as headaches and vision changes. These symptoms may develop as the condition gets worse. Preeclampsia may occur at 20 weeks of pregnancy or later. °Diagnosing and treating preeclampsia early is very important. If not treated early, it can cause serious problems for you and your baby. One problem it can lead to is eclampsia. Eclampsia is a condition that causes muscle jerking or shaking (convulsions or seizures) and other serious problems for the mother. During pregnancy, delivering your baby may be the best treatment for preeclampsia or eclampsia. For most women, preeclampsia and eclampsia symptoms go away after giving birth. °In rare cases, a woman may develop preeclampsia after giving birth (postpartum preeclampsia). This usually occurs within 48 hours after childbirth but may occur up to 6 weeks after giving birth. °What are the causes? °The cause of preeclampsia is not known. °What increases the risk? °The following risk factors make you more likely to develop preeclampsia: °· Being pregnant for the first time. °· Having had preeclampsia during a past pregnancy. °· Having a family history of preeclampsia. °· Having high blood pressure. °· Being pregnant with more than one baby. °· Being 35 or older. °· Being African-American. °· Having kidney disease or diabetes. °· Having medical conditions such as lupus or blood diseases. °· Being very overweight (obese). °What are the signs or symptoms? °The most common symptoms are: °· Severe headaches. °· Vision problems, such as blurred or double vision. °· Abdominal pain, especially upper abdominal pain. °Other symptoms that may develop as the condition gets worse include: °· Sudden weight gain. °· Sudden swelling of the hands, face, legs, and feet. °· Severe nausea  and vomiting. °· Numbness in the face, arms, legs, and feet. °· Dizziness. °· Urinating less than usual. °· Slurred speech. °· Convulsions or seizures. °How is this diagnosed? °There are no screening tests for preeclampsia. Your health care provider will ask you about symptoms and check for signs of preeclampsia during your prenatal visits. You may also have tests that include: °· Checking your blood pressure. °· Urine tests to check for protein. Your health care provider will check for this at every prenatal visit. °· Blood tests. °· Monitoring your baby's heart rate. °· Ultrasound. °How is this treated? °You and your health care provider will determine the treatment approach that is best for you. Treatment may include: °· Having more frequent prenatal exams to check for signs of preeclampsia, if you have an increased risk for preeclampsia. °· Medicine to lower your blood pressure. °· Staying in the hospital, if your condition is severe. There, treatment will focus on controlling your blood pressure and the amount of fluids in your body (fluid retention). °· Taking medicine (magnesium sulfate) to prevent seizures. This may be given as an injection or through an IV. °· Taking a low-dose aspirin during your pregnancy. °· Delivering your baby early. You may have your labor started with medicine (induced), or you may have a cesarean delivery. °Follow these instructions at home: °Eating and drinking ° °· Drink enough fluid to keep your urine pale yellow. °· Avoid caffeine. °Lifestyle °· Do not use any products that contain nicotine or tobacco, such as cigarettes and e-cigarettes. If you need help quitting, ask your health care provider. °· Do not use alcohol or drugs. °· Avoid stress as much as possible. Rest and get   plenty of sleep. °General instructions °· Take over-the-counter and prescription medicines only as told by your health care provider. °· When lying down, lie on your left side. This keeps pressure off your  major blood vessels. °· When sitting or lying down, raise (elevate) your feet. Try putting some pillows underneath your lower legs. °· Exercise regularly. Ask your health care provider what kinds of exercise are best for you. °· Keep all follow-up and prenatal visits as told by your health care provider. This is important. °How is this prevented? °There is no known way of preventing preeclampsia or eclampsia from developing. However, to lower your risk of complications and detect problems early: °· Get regular prenatal care. Your health care provider may be able to diagnose and treat the condition early. °· Maintain a healthy weight. Ask your health care provider for help managing weight gain during pregnancy. °· Work with your health care provider to manage any long-term (chronic) health conditions you have, such as diabetes or kidney problems. °· You may have tests of your blood pressure and kidney function after giving birth. °· Your health care provider may have you take low-dose aspirin during your next pregnancy. °Contact a health care provider if: °· You have symptoms that your health care provider told you may require more treatment or monitoring, such as: °? Headaches. °? Nausea or vomiting. °? Abdominal pain. °? Dizziness. °? Light-headedness. °Get help right away if: °· You have severe: °? Abdominal pain. °? Headaches that do not get better. °? Dizziness. °? Vision problems. °? Confusion. °? Nausea or vomiting. °· You have any of the following: °? A seizure. °? Sudden, rapid weight gain. °? Sudden swelling in your hands, ankles, or face. °? Trouble moving any part of your body. °? Numbness in any part of your body. °? Trouble speaking. °? Abnormal bleeding. °· You faint. °Summary °· Preeclampsia is a serious condition that may develop during pregnancy. °· This condition causes high blood pressure and increased protein in your urine along with other symptoms, such as headaches and vision  changes. °· Diagnosing and treating preeclampsia early is very important. If not treated early, it can cause serious problems for you and your baby. °· Get help right away if you have symptoms that your health care provider told you to watch for. °This information is not intended to replace advice given to you by your health care provider. Make sure you discuss any questions you have with your health care provider. °Document Released: 04/12/2000 Document Revised: 12/16/2017 Document Reviewed: 11/20/2015 °Elsevier Patient Education © 2020 [employer] ° °

## 2018-12-04 ENCOUNTER — Encounter (HOSPITAL_COMMUNITY): Payer: Self-pay | Admitting: *Deleted

## 2018-12-04 ENCOUNTER — Other Ambulatory Visit: Payer: Self-pay

## 2018-12-04 ENCOUNTER — Inpatient Hospital Stay (HOSPITAL_COMMUNITY): Payer: Medicaid Other

## 2018-12-04 ENCOUNTER — Inpatient Hospital Stay (HOSPITAL_COMMUNITY)
Admission: AD | Admit: 2018-12-04 | Discharge: 2018-12-07 | DRG: 807 | Disposition: A | Discharge status: Home / Self Care | Payer: Medicaid Other | Attending: Family Medicine | Admitting: Family Medicine

## 2018-12-04 ENCOUNTER — Encounter (HOSPITAL_COMMUNITY): Payer: Self-pay | Admitting: Anesthesiology

## 2018-12-04 DIAGNOSIS — O30049 Twin pregnancy, dichorionic/diamniotic, unspecified trimester: Secondary | ICD-10-CM | POA: Diagnosis not present

## 2018-12-04 DIAGNOSIS — O1493 Unspecified pre-eclampsia, third trimester: Secondary | ICD-10-CM | POA: Diagnosis not present

## 2018-12-04 DIAGNOSIS — O43819 Placental infarction, unspecified trimester: Secondary | ICD-10-CM | POA: Diagnosis not present

## 2018-12-04 DIAGNOSIS — Z3A35 35 weeks gestation of pregnancy: Secondary | ICD-10-CM | POA: Diagnosis not present

## 2018-12-04 DIAGNOSIS — O365931 Maternal care for other known or suspected poor fetal growth, third trimester, fetus 1: Secondary | ICD-10-CM | POA: Diagnosis present

## 2018-12-04 DIAGNOSIS — Z87891 Personal history of nicotine dependence: Secondary | ICD-10-CM

## 2018-12-04 DIAGNOSIS — O30043 Twin pregnancy, dichorionic/diamniotic, third trimester: Secondary | ICD-10-CM

## 2018-12-04 DIAGNOSIS — O365932 Maternal care for other known or suspected poor fetal growth, third trimester, fetus 2: Secondary | ICD-10-CM | POA: Diagnosis present

## 2018-12-04 DIAGNOSIS — Z3A36 36 weeks gestation of pregnancy: Secondary | ICD-10-CM

## 2018-12-04 DIAGNOSIS — Z3A00 Weeks of gestation of pregnancy not specified: Secondary | ICD-10-CM | POA: Diagnosis not present

## 2018-12-04 DIAGNOSIS — O1404 Mild to moderate pre-eclampsia, complicating childbirth: Secondary | ICD-10-CM | POA: Diagnosis not present

## 2018-12-04 DIAGNOSIS — G43009 Migraine without aura, not intractable, without status migrainosus: Secondary | ICD-10-CM

## 2018-12-04 DIAGNOSIS — O0990 Supervision of high risk pregnancy, unspecified, unspecified trimester: Secondary | ICD-10-CM

## 2018-12-04 DIAGNOSIS — O1403 Mild to moderate pre-eclampsia, third trimester: Secondary | ICD-10-CM

## 2018-12-04 DIAGNOSIS — O365990 Maternal care for other known or suspected poor fetal growth, unspecified trimester, not applicable or unspecified: Secondary | ICD-10-CM | POA: Diagnosis present

## 2018-12-04 DIAGNOSIS — O365930 Maternal care for other known or suspected poor fetal growth, third trimester, not applicable or unspecified: Secondary | ICD-10-CM

## 2018-12-04 LAB — TYPE AND SCREEN
ABO/RH(D): O POS
Antibody Screen: NEGATIVE

## 2018-12-04 LAB — CBC
HCT: 30.3 % — ABNORMAL LOW (ref 36.0–46.0)
HCT: 28.7 % — ABNORMAL LOW (ref 36.0–46.0)
Hemoglobin: 9.9 g/dL — ABNORMAL LOW (ref 12.0–15.0)
Hemoglobin: 9.6 g/dL — ABNORMAL LOW (ref 12.0–15.0)
MCH: 29.2 pg (ref 26.0–34.0)
MCH: 30.1 pg (ref 26.0–34.0)
MCHC: 32.7 g/dL (ref 30.0–36.0)
MCHC: 33.4 g/dL (ref 30.0–36.0)
MCV: 89.4 fL (ref 80.0–100.0)
MCV: 90 fL (ref 80.0–100.0)
Platelets: 180 10*3/uL (ref 150–400)
Platelets: 190 10*3/uL (ref 150–400)
RBC: 3.39 MIL/uL — ABNORMAL LOW (ref 3.87–5.11)
RBC: 3.19 MIL/uL — ABNORMAL LOW (ref 3.87–5.11)
RDW: 13.2 % (ref 11.5–15.5)
RDW: 13.2 % (ref 11.5–15.5)
WBC: 8.2 10*3/uL (ref 4.0–10.5)
WBC: 11.6 10*3/uL — ABNORMAL HIGH (ref 4.0–10.5)
nRBC: 0 % (ref 0.0–0.2)
nRBC: 0 % (ref 0.0–0.2)

## 2018-12-04 LAB — GROUP B STREP BY PCR: Group B strep by PCR: NEGATIVE

## 2018-12-04 MED ORDER — MISOPROSTOL 50MCG HALF TABLET
50.0000 ug | ORAL_TABLET | ORAL | Status: DC
  Administered 2018-12-04 (×2): 50 ug via BUCCAL
  Filled 2018-12-04 (×2): qty 1

## 2018-12-04 MED ORDER — OXYTOCIN BOLUS FROM INFUSION
500.0000 mL | Freq: Once | INTRAVENOUS | Status: AC
  Administered 2018-12-05: 500 mL via INTRAVENOUS

## 2018-12-04 MED ORDER — OXYTOCIN 40 UNITS IN NORMAL SALINE INFUSION - SIMPLE MED
1.0000 m[IU]/min | INTRAVENOUS | Status: DC
  Administered 2018-12-04: 2 m[IU]/min via INTRAVENOUS

## 2018-12-04 MED ORDER — ZOLPIDEM TARTRATE 5 MG PO TABS
5.0000 mg | ORAL_TABLET | Freq: Once | ORAL | Status: AC
  Administered 2018-12-04: 5 mg via ORAL
  Filled 2018-12-04: qty 1

## 2018-12-04 MED ORDER — FENTANYL CITRATE (PF) 100 MCG/2ML IJ SOLN
INTRAMUSCULAR | Status: AC
  Filled 2018-12-04: qty 2

## 2018-12-04 MED ORDER — SOD CITRATE-CITRIC ACID 500-334 MG/5ML PO SOLN: 30.0000 mL | ORAL | Status: DC | PRN

## 2018-12-04 MED ORDER — PENICILLIN G 3 MILLION UNITS IVPB - SIMPLE MED: 3.0000 10*6.[IU] | INTRAVENOUS | Status: DC

## 2018-12-04 MED ORDER — OXYCODONE-ACETAMINOPHEN 5-325 MG PO TABS: 1.0000 | ORAL_TABLET | ORAL | Status: DC | PRN

## 2018-12-04 MED ORDER — HYDRALAZINE HCL 20 MG/ML IJ SOLN: 10.0000 mg | INTRAMUSCULAR | Status: DC | PRN

## 2018-12-04 MED ORDER — OXYTOCIN 40 UNITS IN NORMAL SALINE INFUSION - SIMPLE MED
2.5000 [IU]/h | INTRAVENOUS | Status: DC
  Filled 2018-12-04: qty 1000

## 2018-12-04 MED ORDER — FENTANYL CITRATE (PF) 100 MCG/2ML IJ SOLN
100.0000 ug | INTRAMUSCULAR | Status: DC | PRN
  Administered 2018-12-04 – 2018-12-05 (×6): 100 ug via INTRAVENOUS
  Filled 2018-12-04 (×5): qty 2

## 2018-12-04 MED ORDER — LABETALOL HCL 5 MG/ML IV SOLN: 20.0000 mg | INTRAVENOUS | Status: DC | PRN

## 2018-12-04 MED ORDER — LABETALOL HCL 5 MG/ML IV SOLN
20.0000 mg | INTRAVENOUS | Status: DC | PRN
  Administered 2018-12-04: 20 mg via INTRAVENOUS

## 2018-12-04 MED ORDER — LABETALOL HCL 5 MG/ML IV SOLN: 40.0000 mg | INTRAVENOUS | Status: DC | PRN

## 2018-12-04 MED ORDER — BETAMETHASONE SOD PHOS & ACET 6 (3-3) MG/ML IJ SUSP
12.0000 mg | INTRAMUSCULAR | Status: AC
  Administered 2018-12-04 – 2018-12-05 (×2): 12 mg via INTRAMUSCULAR
  Filled 2018-12-04 (×4): qty 2

## 2018-12-04 MED ORDER — LABETALOL HCL 5 MG/ML IV SOLN: 80.0000 mg | INTRAVENOUS | Status: DC | PRN

## 2018-12-04 MED ORDER — ONDANSETRON HCL 4 MG/2ML IJ SOLN: 4.0000 mg | Freq: Four times a day (QID) | INTRAMUSCULAR | Status: DC | PRN

## 2018-12-04 MED ORDER — TERBUTALINE SULFATE 1 MG/ML IJ SOLN: 0.2500 mg | Freq: Once | INTRAMUSCULAR | Status: DC | PRN

## 2018-12-04 MED ORDER — OXYCODONE-ACETAMINOPHEN 5-325 MG PO TABS: 2.0000 | ORAL_TABLET | ORAL | Status: DC | PRN

## 2018-12-04 MED ORDER — SODIUM CHLORIDE 0.9 % IV SOLN
5.0000 10*6.[IU] | Freq: Once | INTRAVENOUS | Status: AC
  Administered 2018-12-04: 5 10*6.[IU] via INTRAVENOUS
  Filled 2018-12-04: qty 5

## 2018-12-04 MED ORDER — ACETAMINOPHEN 325 MG PO TABS: 650.0000 mg | ORAL_TABLET | ORAL | Status: DC | PRN

## 2018-12-04 MED ORDER — LABETALOL HCL 5 MG/ML IV SOLN
INTRAVENOUS | Status: AC
  Filled 2018-12-04: qty 4

## 2018-12-04 MED ORDER — LACTATED RINGERS IV SOLN: 500.0000 mL | INTRAVENOUS | Status: DC | PRN

## 2018-12-04 MED ORDER — LACTATED RINGERS IV SOLN
INTRAVENOUS | Status: DC
  Administered 2018-12-04 – 2018-12-05 (×3): via INTRAVENOUS

## 2018-12-04 MED ORDER — PENICILLIN G POTASSIUM 5000000 UNITS IJ SOLR: 3.0000 10*6.[IU] | INTRAMUSCULAR | Status: DC

## 2018-12-04 MED ORDER — LIDOCAINE HCL (PF) 1 % IJ SOLN: 30.0000 mL | INTRAMUSCULAR | Status: DC | PRN

## 2018-12-04 MED ORDER — PENICILLIN G 3 MILLION UNITS IVPB - SIMPLE MED
3.0000 10*6.[IU] | INTRAVENOUS | Status: DC
  Administered 2018-12-04 – 2018-12-05 (×5): 3 10*6.[IU] via INTRAVENOUS
  Filled 2018-12-04 (×5): qty 100

## 2018-12-04 NOTE — Progress Notes (Signed)
Pt sitting up to eat dinner.  EFM not tracing effectively, will readjust when pt meal complete.

## 2018-12-04 NOTE — Progress Notes (Signed)
[facility]N [facility]: Labor Progress Note  Subjective: Patient doing well without complaints.  Objective: BP (!) 141/83   Pulse 96   Temp 98.7 F (37.1 C) (Oral)   Resp 20   Ht 5' (1.524 m)   Wt 91.2 kg   LMP 03/27/2018   BMI 39.26 kg/m  Gen: well appearing, NAD Cervical exam: 2/80/-2  FHT: Baby A: 130-mod var, present acel, no decels Baby B: 130-mod var, present acel, no decels UC:   Contracting q 1-2 min   Assessment and Plan: 23 y.o. G1P0 [redacted]w[redacted]d IOL-IUGR/preE w/ SF.  Labor: progressing well, placed cook catheter and inflated uterine balloon to 80cc with NS. Discussed risks/benefits with patient, she is in agreement. Will initiate low dose pit as well and continue time appropriate cervical exams. -- pain control: PRN fentanyl -- PPH Risk: low  Fetal Well-Being: Cephalic by cervical exam.  -- Category 1 - continuous fetal monitoring  -- GBS unknown (PCR completed and negative, but given accuracy of test and pre-term) recommended to initiate PCN  Preterm ([redacted]w[redacted]d) Di/Di twins IUGR --Twin A: 17%ile, Twin B: 5%ile --BMZ @1040  (01[birth year]02[birth year]77)   Corliss Blacker, PGY-III Family Medicine 10:00 PM

## 2018-12-04 NOTE — Progress Notes (Signed)
Notified by RN that blood pressure has had a severe range pressure; RN to treat with IV push medicine.  -patient is asymptomatic -will start magnesium if patient does not respond to IV antihypertensives.

## 2018-12-04 NOTE — Progress Notes (Signed)
   Linda Berry is a 23 y.o. G1P0 at [redacted]w[redacted]d  admitted for IUGR and pre-eclampsia without severe features.   Subjective: She feels well, feeling some cramping.   Objective: Vitals:   12/04/18 0939 12/04/18 1108 12/04/18 1508 12/04/18 1509  BP: 140/84 137/85 (!) 159/98 (!) 151/94  Pulse: 89 88 77 80  Resp:  18  20  Temp:  98.6 F (37 C)  98.5 F (36.9 C)  TempSrc:  Oral  Oral  Weight:      Height:       No intake/output data recorded.  FHT: Baby A: 130-mod var, present acel, no decels but at times loss of contact with EFHR/  Baby B: 135-mod var, present acel, no decels but at times loss of contact with [facility].  UC:   Contracting q 2-3 min with periods of quiescene; overall tracing is reassuring with moderate variability; patient feels strong fetal movements in both babies.  SVE:   Dilation: Closed Effacement (%): Thick Station: -3 Exam by:: Dr. Darene Lamer   Labs: Lab Results  Component Value Date   WBC 8.2 12/04/2018   HGB 9.9 (L) 12/04/2018   HCT 30.3 (L) 12/04/2018   MCV 89.4 12/04/2018   PLT 180 12/04/2018    Assessment / Plan: early labor.  S/p 2 doses of cytotec   Labor: early labor, continue with cytotec Fetal Wellbeing:  Category I Pain Control:  IV pain meds Anticipated MOD:  NSVD  Linda Berry 12/04/2018, 4:31 PM

## 2018-12-04 NOTE — H&P (Signed)
OBSTETRIC ADMISSION HISTORY AND PHYSICAL  Linda Berry is a 23 y.o. female G1P0 with di/di twin IUP at [redacted]w[redacted]d presenting for IOL for IUGR, pregnancy complicated by pre-e (dx 12/03/18) w/o severe features. She reports +FMs. No LOF, VB, blurry vision, headaches, peripheral edema, or RUQ pain. She plans on breastfeeding. She plans on doing natural family planning.  Dating: By LMP --->  Estimated Date of Delivery: 01/01/19  Sono:  (12/01/2018)  @[redacted]w[redacted]d , CWD, normal anatomy Twin A: cephalic presentation BPP 8/8 Twin A [id number]  Twin B: cephalic presentation BPP 8/8 Twin B 5th%ile   Prenatal History/Complications: Di/Di twin, IUGR,   Past Medical History: Past Medical History:  Diagnosis Date  . Asthma   . 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 Scoliosis     Past Surgical History: Past Surgical History:  Procedure Laterality Date  . arm surgery     brachial plexus  . WISDOM TOOTH EXTRACTION      Obstetrical History: OB History    Gravida  1   Para      Term      Preterm      AB      Living  0     SAB      TAB      Ectopic      Multiple      Live Births              Social History: Social History   Socioeconomic History  . Marital status: Single    Spouse name: Not on file  . Number of children: Not on file  . Years of education: Not on file  . Highest education level: Not on file  Occupational History  . Occupation: Geophysicist/field seismologist    Comment: [employer]  Social Needs  . Financial resource strain: Not on file  . Food insecurity    Worry: Never true    Inability: Never true  . Transportation needs    Medical: No    Non-medical: No  Tobacco Use  . Smoking status: Former Smoker    Types: Cigarettes    Quit date: 05/09/2018    Years since quitting: 0.5  . Smokeless tobacco: Never Used  Substance and Sexual Activity  . Alcohol use: Not Currently  . Drug use: Not Currently    Types: Marijuana    Comment: last use December  . Sexual  activity: Yes    Birth control/protection: None  Lifestyle  . Physical activity    Days per week: Not on file    Minutes per session: Not on file  . Stress: Very much  Relationships  . Social Herbalist on phone: Not on file    Gets together: Not on file    Attends religious service: Not on file    Active member of club or organization: Not on file    Attends meetings of clubs or organizations: Not on file    Relationship status: Not on file  Other Topics Concern  . Not on file  Social History Narrative  . Not on file    Family History: Family History  Problem Relation Age of Onset  . Diabetes Mother   . Hypertension Mother   . Huntington's disease Mother   . Ulcers Father   . Cancer Father     Allergies: Allergies  Allergen Reactions  . Ammonia Hives    Medications Prior to Admission  Medication Sig Dispense Refill Last Dose  .  aspirin 81 MG tablet Take 1 tablet (81 mg total) by mouth daily. 30 tablet 11 12/04/2018 at 0700  . calcium carbonate (TUMS - DOSED IN MG ELEMENTAL CALCIUM) 500 MG chewable tablet Chew 1 tablet by mouth daily.   12/03/2018 at 2000  . Elastic Bandages & Supports (WRIST SPLINT/COCK-UP/RIGHT SM) MISC 1 Units by Does not apply route at bedtime as needed. 1 each 0   . Prenatal Vit-Fe Fumarate-FA (MULTIVITAMIN-PRENATAL) 27-0.8 MG TABS tablet Take 1 tablet by mouth daily at 12 noon.   12/04/2018 at 0700  . albuterol (VENTOLIN HFA) 108 (90 Base) MCG/ACT inhaler Inhale 2 puffs into the lungs every 6 (six) hours as needed for wheezing or shortness of breath. 1 Inhaler 12 12/02/2018  . diphenhydrAMINE (BENADRYL) 25 MG tablet Take 25 mg by mouth every 6 (six) hours as needed.   11/28/2018  . Menthol, Topical Analgesic, (BIOFREEZE EX) Apply 1 application topically daily as needed. For back pain   12/02/2018  . Misc. Devices (BREAST PUMP) MISC 1 Device by Does not apply route as needed. 1 each 0   . promethazine (PHENERGAN) 25 MG tablet Take 1 tablet (25 mg  total) by mouth every 6 (six) hours as needed for nausea or vomiting. (Patient not taking: Reported on 12/04/2018) 30 tablet 0 Not Taking at Unknown time  . SUMAtriptan (IMITREX) 100 MG tablet Take 1 tablet (100 mg total) by mouth once as needed for up to 1 dose for migraine. May repeat in 2 hours if headache persists or recurs. (Patient not taking: Reported on 12/04/2018) 20 tablet 11 Not Taking at Unknown time     Review of Systems   All systems reviewed and negative except as stated in HPI  Blood pressure 140/84, pulse 89, temperature 99 F (37.2 C), temperature source Oral, resp. rate 18, height 5' (1.524 m), weight 91.2 kg, last menstrual period 03/27/2018, unknown if currently breastfeeding. General appearance: alert, cooperative and no distress Lungs: regular rate and effort Heart: regular rate  Abdomen: soft, non-tender Extremities: Homans sign is negative, no sign of DVT Presentation: cephalic by bedside ultrasound Fetal monitoring: Twin A: 140 bpm, mod var, accels, no decels, Cat 1 Twin B: 140 bpm, mod var, accels, no decels, Cat1 Uterine activityNone Dilation: Closed Effacement (%): Thick Station: -3 Exam by:: Dr. Salomon MastSparacino   Prenatal labs: ABO, Rh: --/--/O POS (08/07 16100925) Antibody: NEG (08/07 96040925) Rubella: 12.70 (02/25 1609) RPR: Non Reactive (05/29 1003)  HBsAg: Negative (05/29 1111)  HIV: Non Reactive (05/29 1003)  GBS:   unknown (not done) 2 hr GTT 123  Prenatal Transfer Tool  Maternal Diabetes: No Genetic Screening: Normal Maternal Ultrasounds/Referrals: IUGR, Twin pregnancy Fetal Ultrasounds or other Referrals:  Referred to Materal Fetal Medicine  Maternal Substance Abuse:  No Significant Maternal Medications:  None Significant Maternal Lab Results: None  Results for orders placed or performed during the hospital encounter of 12/04/18 (from the past 24 hour(s))  CBC   Collection Time: 12/04/18  8:52 AM  Result Value Ref Range   WBC 8.2 4.0 - 10.5 K/uL    RBC 3.39 (L) 3.87 - 5.11 MIL/uL   Hemoglobin 9.9 (L) 12.0 - 15.0 g/dL   HCT 54.030.3 (L) 98.136.0 - 19.146.0 %   MCV 89.4 80.0 - 100.0 fL   MCH 29.2 26.0 - 34.0 pg   MCHC 32.7 30.0 - 36.0 g/dL   RDW 47.813.2 29.511.5 - 62.115.5 %   Platelets 180 150 - 400 K/uL   nRBC 0.0 0.0 - 0.2 %  Type and screen   Collection Time: 12/04/18  9:25 AM  Result Value Ref Range   ABO/RH(D) O POS    Antibody Screen NEG    Sample Expiration      12/07/2018,2359 Performed at [facility], [address] [place], [place] [address]     Patient Active Problem List   Diagnosis Date Noted  . Preeclampsia, third trimester 12/03/2018  . IUGR (intrauterine growth restriction) affecting care of mother 10/29/2018  . Migraine headache 07/07/2018  . Supervision of high risk pregnancy, antepartum 06/23/2018  . Dichorionic diamniotic twin gestation 06/23/2018  . 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 Scoliosis 06/23/2018  . Asthma affecting pregnancy, antepartum 06/23/2018    Assessment and Plan: Fayrene HelperShentaria T Marksberry is a 23 y.o. G1P0 at 6157w0d here for IOL for IUGR, pregnancy complicated by pre-e (dx 12/03/18) w/o severe features.  1. Labor: induction with cytotec. Consider Foley Bulb, pitocin. Risks of induction discussed, including failure of induction and potential for C/S.   2. FWB: continue with fetal monitoring. NICU made aware. 1st Beta given, 2nd planned for tomorrow  3. Pain: desires unmedicated but will use IV meds for coping if needed, will consider epidural as last resort  4. GBS: unknown, culture sent, tx with PCN until culture results are back  Marlowe AltHailey L Annaelle Kasel, DO  12/04/2018, 10:38 AM

## 2018-12-04 NOTE — Progress Notes (Signed)
LABOR PROGRESS NOTE   Elga is a 23 y.o. G1P0 at [redacted]w[redacted]d with di/di twin pregnancy admitted for IOL 2/2 IUGR and pre-E (dx 12/03/18) w/o severe features   Subjective: Feeling contractions, but they are manageable. No LOF or vaginal bleeding. Feeling babies move.   Objective: Comfortable, coping well FHT:  Twin A: baseline rate 145, moderate variability, accels, no decels, Cat 1 Twin B: baseline rate 135, moderate variability, accels, no decels, Cat 1 Toco: CTX q2-4 min   Assessment / Plan: Sherryn is a 23 y.o. G1P0 at [redacted]w[redacted]d with di/di twin pregnancy admitted for IOL 2/2 IUGR and pre-E (dx 12/03/18) w/o severe features   Labor: continue with cytotec q4h buccal, consider foley bulb, continue time-appropriate cervical exams.   Fetal Wellbeing:  Fetal status reassuring. EFW 8# by Leopold's. Cephalic by Leopold's and SVE.  -- continuous fetal monitoring    Pain Control:  Desires epidural when contractions become painful   Anticipated MOD:  Anticipate cervical ripening, C/S as appropriate.   Mathis Dad, D.O. OB Fellow

## 2018-12-05 ENCOUNTER — Encounter (HOSPITAL_COMMUNITY)
Admission: AD | Disposition: A | Discharge status: Home / Self Care | Payer: Self-pay | Source: Home / Self Care | Attending: Family Medicine

## 2018-12-05 ENCOUNTER — Inpatient Hospital Stay (HOSPITAL_COMMUNITY): Payer: Medicaid Other | Admitting: Anesthesiology

## 2018-12-05 ENCOUNTER — Encounter (HOSPITAL_COMMUNITY): Payer: Self-pay | Admitting: *Deleted

## 2018-12-05 DIAGNOSIS — O365930 Maternal care for other known or suspected poor fetal growth, third trimester, not applicable or unspecified: Secondary | ICD-10-CM

## 2018-12-05 DIAGNOSIS — Z3A36 36 weeks gestation of pregnancy: Secondary | ICD-10-CM

## 2018-12-05 DIAGNOSIS — O30043 Twin pregnancy, dichorionic/diamniotic, third trimester: Secondary | ICD-10-CM

## 2018-12-05 DIAGNOSIS — O1404 Mild to moderate pre-eclampsia, complicating childbirth: Secondary | ICD-10-CM

## 2018-12-05 LAB — CBC
HCT: 27.3 % — ABNORMAL LOW (ref 36.0–46.0)
Hemoglobin: 9 g/dL — ABNORMAL LOW (ref 12.0–15.0)
MCH: 29.3 pg (ref 26.0–34.0)
MCHC: 33 g/dL (ref 30.0–36.0)
MCV: 88.9 fL (ref 80.0–100.0)
Platelets: 155 10*3/uL (ref 150–400)
RBC: 3.07 MIL/uL — ABNORMAL LOW (ref 3.87–5.11)
RDW: 13.2 % (ref 11.5–15.5)
WBC: 15.5 10*3/uL — ABNORMAL HIGH (ref 4.0–10.5)
nRBC: 0 % (ref 0.0–0.2)

## 2018-12-05 LAB — ABO/RH: ABO/RH(D): O POS

## 2018-12-05 LAB — GC/CHLAMYDIA PROBE AMP (~~LOC~~) NOT AT ARMC
Chlamydia: NEGATIVE
Neisseria Gonorrhea: NEGATIVE

## 2018-12-05 LAB — RPR: RPR Ser Ql: NONREACTIVE

## 2018-12-05 SURGERY — LIGATION, FALLOPIAN TUBE, POSTPARTUM
Anesthesia: Choice

## 2018-12-05 MED ORDER — LACTATED RINGERS IV SOLN: 500.0000 mL | Freq: Once | INTRAVENOUS | Status: DC

## 2018-12-05 MED ORDER — FENTANYL-BUPIVACAINE-NACL 0.5-0.125-0.9 MG/250ML-% EP SOLN
EPIDURAL | Status: AC
  Filled 2018-12-05: qty 250

## 2018-12-05 MED ORDER — PHENYLEPHRINE 40 MCG/ML (10ML) SYRINGE FOR IV PUSH (FOR BLOOD PRESSURE SUPPORT): 80.0000 ug | PREFILLED_SYRINGE | INTRAVENOUS | Status: DC | PRN

## 2018-12-05 MED ORDER — FENTANYL-BUPIVACAINE-NACL 0.5-0.125-0.9 MG/250ML-% EP SOLN: 12.0000 mL/h | EPIDURAL | Status: DC | PRN

## 2018-12-05 MED ORDER — DIBUCAINE (PERIANAL) 1 % EX OINT: 1.0000 "application " | TOPICAL_OINTMENT | CUTANEOUS | Status: DC | PRN

## 2018-12-05 MED ORDER — MISOPROSTOL 200 MCG PO TABS
ORAL_TABLET | ORAL | Status: AC
  Filled 2018-12-05: qty 4

## 2018-12-05 MED ORDER — LIDOCAINE HCL (PF) 1 % IJ SOLN
INTRAMUSCULAR | Status: DC | PRN
  Administered 2018-12-05: 10 mL via EPIDURAL

## 2018-12-05 MED ORDER — EPHEDRINE 5 MG/ML INJ: 10.0000 mg | INTRAVENOUS | Status: DC | PRN

## 2018-12-05 MED ORDER — WITCH HAZEL-GLYCERIN EX PADS: 1.0000 "application " | MEDICATED_PAD | CUTANEOUS | Status: DC | PRN

## 2018-12-05 MED ORDER — FERROUS SULFATE 325 (65 FE) MG PO TABS
325.0000 mg | ORAL_TABLET | Freq: Two times a day (BID) | ORAL | Status: DC
  Administered 2018-12-06 – 2018-12-07 (×4): 325 mg via ORAL
  Filled 2018-12-05 (×4): qty 1

## 2018-12-05 MED ORDER — MAGNESIUM HYDROXIDE 400 MG/5ML PO SUSP: 30.0000 mL | ORAL | Status: DC | PRN

## 2018-12-05 MED ORDER — ONDANSETRON HCL 4 MG PO TABS: 4.0000 mg | ORAL_TABLET | ORAL | Status: DC | PRN

## 2018-12-05 MED ORDER — ONDANSETRON HCL 4 MG/2ML IJ SOLN: 4.0000 mg | INTRAMUSCULAR | Status: DC | PRN

## 2018-12-05 MED ORDER — ACETAMINOPHEN 325 MG PO TABS
650.0000 mg | ORAL_TABLET | ORAL | Status: DC | PRN
  Administered 2018-12-05 – 2018-12-07 (×6): 650 mg via ORAL
  Filled 2018-12-05 (×6): qty 2

## 2018-12-05 MED ORDER — SODIUM CHLORIDE (PF) 0.9 % IJ SOLN
INTRAMUSCULAR | Status: DC | PRN
  Administered 2018-12-05: 12 mL/h via EPIDURAL

## 2018-12-05 MED ORDER — COCONUT OIL OIL
1.0000 "application " | TOPICAL_OIL | Status: DC | PRN
  Administered 2018-12-07: 1 via TOPICAL

## 2018-12-05 MED ORDER — DIPHENHYDRAMINE HCL 25 MG PO CAPS: 25.0000 mg | ORAL_CAPSULE | Freq: Four times a day (QID) | ORAL | Status: DC | PRN

## 2018-12-05 MED ORDER — IBUPROFEN 600 MG PO TABS
600.0000 mg | ORAL_TABLET | Freq: Four times a day (QID) | ORAL | Status: DC
  Administered 2018-12-05 – 2018-12-07 (×8): 600 mg via ORAL
  Filled 2018-12-05 (×8): qty 1

## 2018-12-05 MED ORDER — DIPHENHYDRAMINE HCL 50 MG/ML IJ SOLN: 12.5000 mg | INTRAMUSCULAR | Status: DC | PRN

## 2018-12-05 MED ORDER — BENZOCAINE-MENTHOL 20-0.5 % EX AERO
1.0000 "application " | INHALATION_SPRAY | CUTANEOUS | Status: DC | PRN
  Administered 2018-12-06: 1 via TOPICAL
  Filled 2018-12-05: qty 56

## 2018-12-05 MED ORDER — MISOPROSTOL 200 MCG PO TABS
800.0000 ug | ORAL_TABLET | Freq: Once | ORAL | Status: AC
  Administered 2018-12-05: 800 ug via RECTAL

## 2018-12-05 MED ORDER — SIMETHICONE 80 MG PO CHEW: 80.0000 mg | CHEWABLE_TABLET | ORAL | Status: DC | PRN

## 2018-12-05 MED ORDER — PRENATAL MULTIVITAMIN CH
1.0000 | ORAL_TABLET | Freq: Every day | ORAL | Status: DC
  Administered 2018-12-06 – 2018-12-07 (×2): 1 via ORAL
  Filled 2018-12-05 (×2): qty 1

## 2018-12-05 MED ORDER — SENNOSIDES-DOCUSATE SODIUM 8.6-50 MG PO TABS
2.0000 | ORAL_TABLET | Freq: Every day | ORAL | Status: DC
  Administered 2018-12-05 – 2018-12-07 (×3): 2 via ORAL
  Filled 2018-12-05 (×3): qty 2

## 2018-12-05 NOTE — Anesthesia Procedure Notes (Signed)
Epidural Patient location during procedure: OB  Staffing Anesthesiologist: Dilynn Munroe, MD Performed: anesthesiologist   Preanesthetic Checklist Completed: patient identified, site marked, surgical consent, pre-op evaluation, timeout performed, IV checked, risks and benefits discussed and monitors and equipment checked  Epidural Patient position: sitting Prep: DuraPrep Patient monitoring: heart rate, continuous pulse ox and blood pressure Approach: midline Location: L3-L4 Injection technique: LOR saline  Needle:  Needle type: Tuohy  Needle gauge: 17 G Needle length: 9 cm and 9 Needle insertion depth: 6 cm Catheter type: closed end flexible Catheter size: 20 Guage Catheter at skin depth: 10 cm Test dose: negative  Assessment Events: blood not aspirated, injection not painful, no injection resistance, negative IV test and no paresthesia  Additional Notes Patient identified. Risks/Benefits/Options discussed with patient including but not limited to bleeding, infection, nerve damage, paralysis, failed block, incomplete pain control, headache, blood pressure changes, nausea, vomiting, reactions to medication both or allergic, itching and postpartum back pain. Confirmed with bedside nurse the patient's most recent platelet count. Confirmed with patient that they are not currently taking any anticoagulation, have any bleeding history or any family history of bleeding disorders. Patient expressed understanding and wished to proceed. All questions were answered. Sterile technique was used throughout the entire procedure. Please see nursing notes for vital signs. Test dose was given through epidural needle and negative prior to continuing to dose epidural or start infusion. Warning signs of high block given to the patient including shortness of breath, tingling/numbness in hands, complete motor block, or any concerning symptoms with instructions to call for help. Patient was given  instructions on fall risk and not to get out of bed. All questions and concerns addressed with instructions to call with any issues.     

## 2018-12-05 NOTE — Progress Notes (Signed)
Patient ID: Linda Berry, female   DOB: Sep 02, 1995, 23 y.o.   MRN: [id number]   Dr. Kennon Rounds notified of labor status, intent to assess for pushing.  Mallie Snooks, MSN, CNM Certified Nurse Midwife, Faculty Practice 12/05/18 4:39 PM

## 2018-12-05 NOTE — Progress Notes (Signed)
OB/GYN Faculty Practice: Labor Progress Note  Subjective: Patient doing well without complaints.  Objective: BP (!) 126/59   Pulse (!) 103   Temp 98.1 F (36.7 C) (Oral)   Resp 18   Ht 5' (1.524 m)   Wt 91.2 kg   LMP 03/27/2018   BMI 39.26 kg/m  Gen: well appearing, NAD  Dilation: 2 Effacement (%): 80 Cervical Position: Posterior Station: -2 Presentation: Vertex Exam by:: A Marlowe Cinquemani MD  FHT: Baby A: 150-mod var, present acel, no decels Baby B: 125-mod var, present acel, no decels UC:   Contracting q 1-4 min   Assessment and Plan: 23 y.o. G1P0 [redacted]w[redacted]d IOL-IUGR/preE w/ SF.  Labor: s/p cytotec x2. progressing well, 80cc cook catheter in placement firmly. Pit@6 . Continue time appropriate cervical exams. -- pain control: PRN fentanyl, plan for epidural after cook catheter dislodges per patient request -- PPH Risk: low  Fetal Well-Being: Cephalic by cervical exam.  -- Category 1 - continuous fetal monitoring  -- GBS unknown (PCR completed and negative, but given accuracy of test and pre-term) recommended to initiate PCN  Preterm ([redacted]w[redacted]d) Di/Di twins IUGR --Twin A: 17%ile, Twin B: 5%ile --BMZ @1040  (10/30/92)   Corliss Blacker, PGY-III Family Medicine 1:28 AM

## 2018-12-05 NOTE — Anesthesia Postprocedure Evaluation (Signed)
Anesthesia Post Note  Patient: Linda Berry  Procedure(s) Performed: AN AD HOC LABOR EPIDURAL     Patient location during evaluation: Mother Baby Anesthesia Type: Epidural Level of consciousness: awake and alert Pain management: pain level controlled Vital Signs Assessment: post-procedure vital signs reviewed and stable Respiratory status: spontaneous breathing, nonlabored ventilation and respiratory function stable Cardiovascular status: stable Postop Assessment: no headache, no backache and epidural receding Anesthetic complications: no    Last Vitals:  Vitals:   12/05/18 1631 12/05/18 1730  BP: (!) 145/89 (!) 152/85  Pulse: 87 93  Resp:  18  Temp:    SpO2:      Last Pain:  Vitals:   12/05/18 1401  TempSrc: Oral  PainSc:    Pain Goal:                   Gabbie Marzo

## 2018-12-05 NOTE — Progress Notes (Signed)
OB/GYN Faculty Practice: Labor Progress Note  Subjective: Patient doing well without complaints.  Objective: BP (!) 126/59   Pulse (!) 103   Temp 98.1 F (36.7 C) (Oral)   Resp 18   Ht 5' (1.524 m)   Wt 91.2 kg   LMP 03/27/2018   BMI 39.26 kg/m  Gen: well appearing, NAD  Dilation: 2 Effacement (%): 80 Cervical Position: Posterior Station: -2 Presentation: Vertex Exam by:: A Kareena Arrambide MD  FHT: Baby A: 140-mod var, present acel, no decels Baby B: 140-mod var, present acel, no decels UC:   Contracting q 1-4 min   Assessment and Plan: 23 y.o. G1P0 [redacted]w[redacted]d IOL-IUGR/preE w/ SF.  Labor: s/p cytotec x2. Progressing well, 80cc cook catheter in placement firmly. Pit@6 . Continue time appropriate cervical exams. -- pain control: PRN fentanyl, plan for epidural after cook catheter dislodges per patient request -- PPH Risk: low  Fetal Well-Being: Cephalic by cervical exam.  -- Category 1 - continuous fetal monitoring  -- GBS unknown-PCN (PCR completed and negative, but given accuracy of test and pre-term) recommended to initiate PCN  Preterm ([redacted]w[redacted]d) Di/Di twins IUGR --Twin A: 17%ile, Twin B: 5%ile --BMZ @1040  (10/29/07)   Corliss Blacker, PGY-III Family Medicine 2:35 AM

## 2018-12-05 NOTE — Anesthesia Preprocedure Evaluation (Signed)
Anesthesia Evaluation  Patient identified by MRN, date of birth, ID band Patient awake    Reviewed: Allergy & Precautions, H&P , NPO status , Patient's Chart, lab work & pertinent test results  History of Anesthesia Complications Negative for: history of anesthetic complications  Airway Mallampati: II  TM Distance: >3 FB Neck ROM: full    Dental no notable dental hx. (+) Teeth Intact   Pulmonary neg pulmonary ROS, former smoker,    Pulmonary exam normal breath sounds clear to auscultation       Cardiovascular negative cardio ROS Normal cardiovascular exam Rhythm:regular Rate:Normal     Neuro/Psych Birth related brachial plexus injury negative psych ROS   GI/Hepatic negative GI ROS, Neg liver ROS,   Endo/Other  negative endocrine ROS  Renal/GU negative Renal ROS  negative genitourinary   Musculoskeletal   Abdominal   Peds  Hematology negative hematology ROS (+)   Anesthesia Other Findings   Reproductive/Obstetrics (+) Pregnancy                             Anesthesia Physical Anesthesia Plan  ASA: II  Anesthesia Plan: Epidural   Post-op Pain Management:    Induction:   PONV Risk Score and Plan:   Airway Management Planned:   Additional Equipment:   Intra-op Plan:   Post-operative Plan:   Informed Consent: I have reviewed the patients History and Physical, chart, labs and discussed the procedure including the risks, benefits and alternatives for the proposed anesthesia with the patient or authorized representative who has indicated his/her understanding and acceptance.       Plan Discussed with:   Anesthesia Plan Comments:         Anesthesia Quick Evaluation

## 2018-12-05 NOTE — Progress Notes (Signed)
Linda Berry is a 23 y.o. G1P0 at [redacted]w[redacted]d admitted for di-di twin gestation with IUGR. Pregnancy complications include Preeclampsia without severe features.  Subjective: Resting comfortably in bed with epidural infusing and effective.  Objective: BP 132/83   Pulse 91   Temp 98.1 F (36.7 C) (Oral)   Resp 18   Ht 5' (1.524 m)   Wt 91.2 kg   LMP 03/27/2018   SpO2 100%   BMI 39.26 kg/m  No intake/output data recorded. Total I/O In: -  Out: 550 [Urine:550]  FHT Baby A:  FHR: 125 bpm, variability: moderate,  accelerations:  Present,  decelerations:  Absent FHT Baby B: FHR 130 bpm, moderate variability, positive accels, no decels UC: 3-4 min SVE:   Dilation: 6.5 Effacement (%): 90 Station: -1 Exam by:: [employer]  Labs: Lab Results  Component Value Date   WBC 11.6 (H) 12/04/2018   HGB 9.6 (L) 12/04/2018   HCT 28.7 (L) 12/04/2018   MCV 90.0 12/04/2018   PLT 190 12/04/2018    Assessment / Plan: 23 y.o. G1P0 at [redacted]w[redacted]d with di-di twins Cat I tracing x 2 Pitocin infusing at 22 milliunits Betamethasone 2 of 2 due at 1030, being walked up from Pharmacy now Intermittently elevated BP, no severe range, no severe signs/symptoms Baby A well engaged AROM now clear fluid now Anticipate NSVD  Darlina Rumpf, CNM 12/05/2018, 11:14 AM

## 2018-12-05 NOTE — Discharge Instructions (Signed)

## 2018-12-05 NOTE — Lactation Note (Signed)
This note was copied from a baby's chart. Lactation Consultation Note  Patient Name: Linda Berry [id number] Date: 12/05/2018 Reason for consult: Initial assessment;Primapara;1st time breastfeeding;Late-preterm 34-36.6wks;Multiple gestation;Infant < 6lbs  2210 - 2300 - I conducted an initial consult with Linda Berry to help her with initiation of breast feeding with her LPTI twin infants.   Mom has not latched babies at this point. Baby B had low temps during the visit and was transferred to the nursery for the warmer (dad had previously been holding baby skin to skin).   Baby A and B each had about 5 mls of formula by bottle about 90 minutes prior to entry. Neither baby exhibited feeding cues. I helped mom learn how to hand express (noted colostrum) and showed her how to position Baby A Benna Dunks) in football hold on Linda Berry's left breast. Baby was too sleepy to latch, and I reviewed positioning and how to stimulate baby's filtrum to elicit root reflex. We hand expressed some drops of colostrum into Linda Berry's mouth.  Following this attempt, dad took Linda Berry for skin to skin while I reviewed LPTI feeding protocol and set up a DEBP. Mom brought her pumping bra and her My [place] Friend twin pillow. She has about 40% usage of her left arm, and is unable to use her left hand and wrist. I helped her put on her pumping bra and fit her flanges. We used a size 27 flange and I adjusted pressure.  I showed mom how to use the initiation setting and reviewed with both parents how to clean pump equipment.   PLAN: STS with babies. Feed on demand as per LPTI policy. Supplement as per LPTI policy. Pump for 15 minutes (both breasts) 8 times/day on the initiation setting.  Maternal Data Formula Feeding for Exclusion: No Has patient been taught Hand Expression?: Yes Does the patient have breastfeeding experience prior to this delivery?: No  Feeding Feeding Type: Bottle Fed - Formula Nipple Type: Slow -  flow   Interventions Interventions: Breast feeding basics reviewed;Assisted with latch;Skin to skin;Hand express;Adjust position;Support pillows;DEBP  Lactation Tools Discussed/Used WIC Program: Yes Pump Review: Setup, frequency, and cleaning Initiated by:: hl Date initiated:: 12/05/18   Consult Status Consult Status: Follow-up Date: 12/06/18 Follow-up type: In-patient    Lenore Manner 12/05/2018, 11:21 PM

## 2018-12-05 NOTE — Progress Notes (Signed)
OB/GYN Faculty Practice: Labor Progress Note  Subjective: Patient doing well without complaints.  Objective: BP (!) 141/75   Pulse 97   Temp 98.1 F (36.7 C) (Oral)   Resp 18   Ht 5' (1.524 m)   Wt 91.2 kg   LMP 03/27/2018   SpO2 100%   BMI 39.26 kg/m  Gen: well appearing, NAD  Dilation: 6 Effacement (%): 90 Cervical Position: Posterior Station: -1 Presentation: Vertex Exam by:: Corliss Parish MD   FHT: Baby A: 130-mod var, present acel, no decels Baby B: 130-mod var, present acel, no decels UC:   Contracting q 1-3 min   Assessment and Plan: 23 y.o. G1P0 [redacted]w[redacted]d IOL-IUGR/preE w/ SF.  Labor: s/p cytotec x2 and FB. Pit@8 . Continue time appropriate cervical exams and consider AROM when able. -- pain control: epidural in place -- PPH Risk: low  Fetal Well-Being: Cephalic by cervical exam.  -- Category 1 - continuous fetal monitoring  -- GBS unknown-PCN (PCR completed and negative, but given accuracy of test and pre-term) recommended to initiate PCN  Preterm ([redacted]w[redacted]d) Di/Di twins IUGR --Twin A: 17%ile, Twin B: 5%ile --BMZ @1040  (0/3/21)   Corliss Blacker, PGY-III Family Medicine 4:53 AM

## 2018-12-05 NOTE — Discharge Summary (Signed)
Obstetrics Discharge Summary [facility]   Patient Name: Linda Berry DOB: 03/12/1996 MRN: [id number]  Date of admission: 12/04/2018 Delivering MD:    Shyrl NumbersClegg, GirlA Chanelle [[id number]][[id number]]  Warren DanesWEINHOLD, SAMANTHA C    [place], GirlB [place] Grove[place]hen[place] [[id number]]  Clayton BiblesWEINHOLD, SAMANTHA C    Date of discharge: 12/07/2018  Admitting diagnosis: pregnancy Intrauterine pregnancy: 7455w1d     Secondary diagnosis:   Active Problems:   IUGR (intrauterine growth restriction) affecting care of mother   Additional problems:  . NONE     Discharge diagnosis: Preterm Pregnancy Delivered and Preeclampsia (mild)                                            Postpartum procedures: None  Complications: None  Outpatient Follow-Up: - Needs BP check in about 3 days   Hospital course: Linda Berry is a 23 y.o. 6355w1d who was admitted for IOL 2/2/ IUGR. Her pregnancy was complicated by D-/Di twins, IUGR, preeclampsia. Her labor course was notable for none. Delivery was complicated by none. Please see delivery/op note for additional details. Her postpartum course was uncomplicated. She was breastfeeding without difficulty. By day of discharge, she was passing flatus, urinating, eating and drinking without difficulty. Her pain was well-controlled, and she was discharged home with  ibuprofen. She will follow-up in clinic in 4 weeks.   Physical exam  Vitals:   12/06/18 0533 12/06/18 0936 12/06/18 2156 12/07/18 0545  BP: 133/72 133/69 123/71 136/76  Pulse: 89 79 89 92  Resp: 18 18 20 18   Temp: 99.1 F (37.3 C) 98.4 F (36.9 C) 98.8 F (37.1 C) 99.2 F (37.3 C)  TempSrc: Oral Oral Oral Oral  SpO2:   100% 100%  Weight:      Height:       General: Alert and Oriented, NAD Lochia: appropriate Uterine Fundus: firm Incision: N/A DVT Evaluation: No evidence of DVT seen on physical exam. Labs: Lab Results  Component Value Date   WBC 15.5 (H) 12/05/2018   HGB 9.0 (L) 12/05/2018   HCT 27.3 (L)  12/05/2018   MCV 88.9 12/05/2018   PLT 155 12/05/2018   CMP Latest Ref Rng & Units 12/03/2018  Glucose 70 - 99 mg/dL 79  BUN 6 - 20 mg/dL 6  Creatinine 5.280.44 - 4.131.00 mg/dL 2.440.55  Sodium 010135 - 272145 mmol/L 133(L)  Potassium 3.5 - 5.1 mmol/L 3.6  Chloride 98 - 111 mmol/L 104  CO2 22 - 32 mmol/L 17(L)  Calcium 8.9 - 10.3 mg/dL 9.1  Total Protein 6.5 - 8.1 g/dL 6.0(L)  Total Bilirubin 0.3 - 1.2 mg/dL 0.6  Alkaline Phos 38 - 126 U/L 173(H)  AST 15 - 41 U/L 18  ALT 0 - 44 U/L 12    Discharge instructions: Per After Visit Summary and "Baby and Me Booklet"  After visit meds:  Allergies as of 12/07/2018      Reactions   Ammonia Hives      Medication List    STOP taking these medications   promethazine 25 MG tablet Commonly known as: PHENERGAN   SUMAtriptan 100 MG tablet Commonly known as: IMITREX     TAKE these medications   albuterol 108 (90 Base) MCG/ACT inhaler Commonly known as: VENTOLIN HFA Inhale 2 puffs into the lungs every 6 (six) hours as needed for wheezing or shortness of breath.   aspirin 81 MG tablet  Take 1 tablet (81 mg total) by mouth daily.   BIOFREEZE EX Apply 1 application topically daily as needed. For back pain   Breast Pump Misc 1 Device by Does not apply route as needed.   calcium carbonate 500 MG chewable tablet Commonly known as: TUMS - dosed in mg elemental calcium Chew 1 tablet by mouth daily.   diphenhydrAMINE 25 MG tablet Commonly known as: BENADRYL Take 25 mg by mouth every 6 (six) hours as needed.   ibuprofen 800 MG tablet Commonly known as: ADVIL Take 1 tablet (800 mg total) by mouth every 8 (eight) hours as needed.   multivitamin-prenatal 27-0.8 MG Tabs tablet Take 1 tablet by mouth daily at 12 noon.   Wrist Splint/Cock-Up/Right Sm Misc 1 Units by Does not apply route at bedtime as needed.       Postpartum contraception: None Diet: Routine Diet Activity: Advance as tolerated. Pelvic rest for 6 weeks.   Follow-up Appt: Future  Appointments  Date Time Provider [place]  01/05/2019  1:55 PM Jorje Guild, NP [facility]   Follow-up Visit:No follow-ups on file.  Newborn Data:   Keilany, Burnette [[id number]]  Live born female  Birth Weight: 5 lb 2.5 oz (2340 g) APGAR: 8, 9  Newborn Delivery   Birth date/time: 12/05/2018 17:20:00 Delivery type: Vaginal, Spontaneous       Jaimy, Kliethermes [[id number]]  Live born female  Birth Weight: 4 lb 7.3 oz (2020 g) APGAR: 8, 9  Newborn Delivery   Birth date/time: 12/05/2018 17:41:00 Delivery type: Vaginal, Spontaneous      Baby Feeding: Bottle and Breast Disposition:home with mother    Marcille Buffy DNP, CNM  12/07/18  11:16 AM

## 2018-12-06 ENCOUNTER — Other Ambulatory Visit: Payer: Self-pay

## 2018-12-06 MED ORDER — CYCLOBENZAPRINE HCL 10 MG PO TABS
10.0000 mg | ORAL_TABLET | Freq: Once | ORAL | Status: AC
  Administered 2018-12-06: 10 mg via ORAL
  Filled 2018-12-06: qty 1

## 2018-12-06 MED ORDER — OXYCODONE HCL 5 MG PO TABS
5.0000 mg | ORAL_TABLET | Freq: Once | ORAL | Status: AC
  Administered 2018-12-06: 10 mg via ORAL
  Filled 2018-12-06: qty 2

## 2018-12-06 NOTE — Lactation Note (Signed)
This note was copied from a baby's chart. Lactation Consultation Note  Patient Name: Linda Berry SJGGE'Z Date: 12/06/2018 Reason for consult: Follow-up assessment  2204 - I followed up with [name]. She reports that she is exhausted, but she is happy because both babies latched to the breast today. She is continuing with the LPTI protocol. She states that it has been difficult to maintain pumping schedule of every three hours due to how busy she is with the babies, but she is committed to doing her best. She was able to pump a few mls today to provide to the babies. Dad is also doing well and doing his best not to complain about his exhaustion as well and be a support to mom.   [name] mother is local and will be a support for her after discharge.  [name] is interested in renting a pump before she goes home. I faxed a referral [facility] to [facility] on 12/05/18. I also suggested that an OP lactation follow up consult  may be helpful for her, and she indicated interest. I offered to put in a referral.  I did not observe a feeding at this visit. Babies recently fed. LC follow up tomorrow recommended. No further questions or concerns at this time.  I followed up with night shift RN regarding [name]'s progress.   Maternal Data Has patient been taught Hand Expression?: Yes Does the patient have breastfeeding experience prior to this delivery?: No  Feeding Feeding Type: Breast Milk with Formula added Nipple Type: Extra Slow Flow   Interventions Interventions: Breast feeding basics reviewed  Lactation Tools Discussed/Used WIC Program: Yes   Consult Status Consult Status: Follow-up Date: 12/07/18 Follow-up type: In-patient    Lenore Manner 12/06/2018, 11:07 PM

## 2018-12-06 NOTE — Progress Notes (Signed)
Post Partum Day #1 TSVD Di/Di Twins Subjective: no complaints  Objective: Blood pressure 133/72, pulse 89, temperature 99.1 F (37.3 C), temperature source Oral, resp. rate 18, height 5' (1.524 m), weight 91.2 kg, last menstrual period 03/27/2018, SpO2 100 %, unknown if currently breastfeeding.  Physical Exam:  General: alert Lochia: appropriate Uterine Fundus: firm Incision: healing well DVT Evaluation: No evidence of DVT seen on physical exam.  Recent Labs    12/04/18 2310 12/05/18 1934  HGB 9.6* 9.0*  HCT 28.7* 27.3*    Assessment/Plan: Plan for discharge tomorrow   LOS: 2 days   Chancy Milroy 12/06/2018, 9:24 AM

## 2018-12-07 ENCOUNTER — Encounter: Payer: Medicaid Other | Admitting: Obstetrics & Gynecology

## 2018-12-07 MED ORDER — IBUPROFEN 800 MG PO TABS: 800.0000 mg | ORAL_TABLET | Freq: Three times a day (TID) | ORAL | 0 refills | Status: DC | PRN

## 2018-12-07 NOTE — Lactation Note (Addendum)
This note was copied from a baby's chart. Lactation Consultation Note  Patient Name: Linda Berry GMWNU'U Date: 12/07/2018  Baby A -  Baby is 71 hours old - 5% weight loss , at 36 hours Bili 10.0  Late preterm 361/7 days. Less than 6 pounds.  Baby awake when [place] entered the room and showing feeding cues .  LC offered to assist with latch. Dad changed a wet diaper.  Baby placed STS on the left breast and after few attempts latched with depth and fed for 15 mins with increased swallows with compressions. After breast feeding mom supplemented baby with 30 ml of formula with a yellow nipple and baby tolerated well.  LC reviewed the [place] feeding plan and the importance of feeding with feeding cues and by 3 hours.  LC reviewed and updated babies doc flow sheets from feedings 11-7.  LC recommended  Shells between feedings for reverse pressure - especially for the left breast .  Feed with feeding cues and by 3 hours.  STS feedings - attempt latch as shown and feed for 15 -20  mins - supplement and work on increasing volume to 30 ml for supplement due to age.  After feedings at the breast - post pump both breast for 15 - 20 mins -save milk for the next feedings.  With pumping use a dab of coconut oil to soften the areola and then pump. Try the #24 F and see what the comfort is - may be #24 F on the right and #27 F on the left.   Per mom called and left a message and plan to call them back to arrange for a DEBP tomorrow at D/C .    Maternal Data Has patient been taught Hand Expression?: Yes  Feeding Feeding Type: Breast Fed Nipple Type: Extra Slow Flow  LATCH Score Latch: Repeated attempts needed to sustain latch, nipple held in mouth throughout feeding, stimulation needed to elicit sucking reflex.  Audible Swallowing: Spontaneous and intermittent  Type of Nipple: Everted at rest and after stimulation  Comfort (Breast/Nipple): Soft / non-tender  Hold (Positioning): Assistance needed  to correctly position infant at breast and maintain latch.  LATCH Score: 8  Interventions Interventions: Breast feeding basics reviewed;Assisted with latch;Skin to skin;Breast massage;Breast compression;Hand express;Adjust position;Support pillows;Position options;Shells;DEBP  Lactation Tools Discussed/Used Tools: Shells;Pump;Flanges(LC instructed mom on the use of shells) Flange Size: 24;27(LC recommended using a dab of coconut oil and pump /. try the #24 flanges and see what her comfort is . - see LC note) Shell Type: Inverted Breast pump type: Double-Electric Breast Pump WIC Program: Yes(per mom there is a voice mail from [facility] and she needs to call them back)   Consult Status Consult Status: Follow-up Date: 12/08/18 Follow-up type: In-patient    [place] 12/07/2018, 11:43 AM

## 2018-12-07 NOTE — Lactation Note (Signed)
This note was copied from a baby's chart. Lactation Consultation Note  Patient Name: Naje Rice ENIDP'O Date: 12/07/2018 Reason for consult: Follow-up assessment;Primapara;1st time breastfeeding;Multiple gestation;Infant < 6lbs;Late-preterm 34-36.6wks;Infant weight loss  Baby is 81 hours old , 'awake and acting hungry.  LC offered to assist to latch . LC changed a wet diaper .  LC placed baby STS on the right breast, depth achieved and baby fed for 15 mins with multiple swallows, increased with compressions.  Baby became non - nutritive and LC showed mom how to take baby off , nipple well rounded . Expressed milk easily.  After feeding dad supplemented with purple nipple and was only able to get her to eat 8 ml.  LC reminded dad the formula once opened has 1 hour.  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  Discussed with with mom and dad.  Baby did feed very well at the breast.  LC reviewed the [place] feeding plan and the importance of feeding with feeding cues and by 3 hours.  LC reviewed and updated babies doc flow sheets from feedings 11-7.  LC recommended  Shells between feedings for reverse pressure - especially for the left breast .  Feed with feeding cues and by 3 hours.  STS feedings - attempt latch as shown and feed for 15 -20  mins - supplement and work on increasing volume to 30 ml for supplement due to age.  After feedings at the breast - post pump both breast for 15 - 20 mins -save milk for the next feedings.  With pumping use a dab of coconut oil to soften the areola and then pump. Try the #24 F and see what the comfort is - may be #24 F on the right and #27 F on the left.   Mom plans on calling [facility]IC - [place] today to arrange for a DEBP.  Referral was faxed yesterday.    Maternal Data Has patient been taught Hand Expression?: Yes  Feeding Feeding Type: Breast Fed  LATCH Score Latch: Repeated attempts needed to sustain latch, nipple held in mouth throughout  feeding, stimulation needed to elicit sucking reflex.  Audible Swallowing: Spontaneous and intermittent  Type of Nipple: Everted at rest and after stimulation  Comfort (Breast/Nipple): Soft / non-tender  Hold (Positioning): Assistance needed to correctly position infant at breast and maintain latch.  LATCH Score: 8  Interventions Interventions: Breast feeding basics reviewed;Assisted with latch;Skin to skin;Breast compression;Adjust position;Support pillows;Position options;Shells;DEBP  Lactation Tools Discussed/Used Tools: Shells;Pump;Flanges;Coconut oil Flange Size: 24;27 Shell Type: Inverted Breast pump type: Double-Electric Breast Pump WIC Program: Yes   Consult Status Consult Status: Follow-up Date: 12/08/18 Follow-up type: In-patient    [place] 12/07/2018, 12:07 PM

## 2018-12-08 ENCOUNTER — Ambulatory Visit (HOSPITAL_COMMUNITY): Payer: Medicaid Other

## 2018-12-08 ENCOUNTER — Ambulatory Visit: Payer: Self-pay

## 2018-12-08 NOTE — Lactation Note (Signed)
This note was copied from a baby's chart. Lactation Consultation Note  Patient Name: Linda Berry BJYNW'G Date: 12/08/2018 Reason for consult: Follow-up assessment;Late-preterm 34-36.6wks;1st time breastfeeding;Primapara;Multiple gestation;Infant < 6lbs  P2 mother whose infant twin girls are now 56 hours old.  These babies are LPTI at 35+1 weeks weighing < 6 lbs.  Mother is ready to be discharged and has a [facility] appointment later this morning to obtain her DEBP.    Reviewed the LPTI  feeding volume supplementation guidelines with mother.  Encouraged her to increase volumes to a minimum of 30 mls every three hours.  Encouraged her to allow babies to consume more as desired and to breast feed with cues.  Discussed how the volumes will increase fairly quickly over the next couple of weeks.    Mother's breasts are filling but not engorged.  She stated that the left breast does not produce as much as the right one.  She has also been using the right breast more frequently for feeding the babies.  Encouraged to continue feeding both babies on each breast, and , with pumping diligently her left breast should increase in milk volumes.  Engorgement prevention/treatment reviewed.    Mother had no further questions/concerns.  She has our OP phone number for questions after discharge.  Father present and packing up the belongings for discharge.  RN updated.     Maternal Data    Feeding Feeding Type: Bottle Fed - Formula Nipple Type: Slow - flow  LATCH Score                   Interventions    Lactation Tools Discussed/Used     Consult Status Consult Status: Complete Date: 12/08/18 Follow-up type: Call as needed    Cher Egnor R Jaimes Eckert 12/08/2018, 10:20 AM

## 2018-12-10 ENCOUNTER — Telehealth: Payer: Medicaid Other

## 2018-12-15 ENCOUNTER — Telehealth: Payer: Self-pay | Admitting: Student

## 2018-12-15 NOTE — Telephone Encounter (Signed)
Attempted to reach patient to inform her about appointment changes made in the office. Left a message for her to call us back.

## 2019-01-01 ENCOUNTER — Encounter: Payer: Self-pay | Admitting: Student

## 2019-01-01 ENCOUNTER — Telehealth: Payer: Self-pay | Admitting: Student

## 2019-01-01 NOTE — Telephone Encounter (Signed)
Attempted to call patient about her appointment on 9/8 @ 10:15. No answer left voicemail instructing patient to wear a face mask for the entire appointment and no visitors are allowed during the visit. Patient instructed not to attend the appointment if she was any symptoms. Symptom list and office number left.

## 2019-01-05 ENCOUNTER — Ambulatory Visit (INDEPENDENT_AMBULATORY_CARE_PROVIDER_SITE_OTHER): Payer: Medicaid Other | Admitting: Student

## 2019-01-05 ENCOUNTER — Other Ambulatory Visit: Payer: Self-pay

## 2019-01-05 ENCOUNTER — Encounter: Payer: Self-pay | Admitting: Student

## 2019-01-05 VITALS — BP 133/79 | HR 86 | Wt 168.0 lb

## 2019-01-05 DIAGNOSIS — Z1389 Encounter for screening for other disorder: Secondary | ICD-10-CM

## 2019-01-05 DIAGNOSIS — Z0000 Encounter for general adult medical examination without abnormal findings: Secondary | ICD-10-CM

## 2019-01-05 NOTE — Progress Notes (Signed)
Subjective:     Linda Berry is a 23 y.o. female who presents for a postpartum visit. She is 4 weeks postpartum following a SVD. I have fully reviewed the prenatal and intrapartum course. The delivery was at [redacted]w[redacted]d gestational weeks. Outcome: spontaneous vaginal delivery. Anesthesia: epidural. Postpartum course has been unremarkable. Baby's course has been unremarkable. Baby is feeding by both breast and bottle. Bleeding no bleeding. Bowel function is normal. Bladder function is normal. Patient is not sexually active. Contraception method is none. Postpartum depression screening: Negative  The following portions of the patient's history were reviewed and updated as appropriate: allergies, current medications, past family history, past medical history, past social history, past surgical history and problem list.  Review of Systems Pertinent items are noted in HPI.   Objective:    BP 133/79   Pulse 86   Wt 168 lb (76.2 kg)   LMP 03/27/2018   BMI 32.81 kg/m   General:  alert, cooperative and appears stated age  Lungs: clear to auscultation bilaterally  Heart:  regular rate and rhythm, S1, S2 normal, no murmur, click, rub or gallop  Abdomen: soft, non-tender; bowel sounds normal; no masses,  no organomegaly   Vulva:  not evaluated  Vagina: not evaluated        Assessment:     Normal postpartum exam. Pap smear 07/07/2018  Plan:    1. Contraception: natural family planning & condoms 2. Doing well. BP today ok. Will refer to primary care.  3. Follow up  as needed.    Jorje Guild, NP

## 2019-01-05 NOTE — Patient Instructions (Signed)
Health Maintenance, Female Adopting a healthy lifestyle and getting preventive care are important in promoting health and wellness. Ask your health care provider about:  The right schedule for you to have regular tests and exams.  Things you can do on your own to prevent diseases and keep yourself healthy. What should I know about diet, weight, and exercise? Eat a healthy diet   Eat a diet that includes plenty of vegetables, fruits, low-fat dairy products, and lean protein.  Do not eat a lot of foods that are high in solid fats, added sugars, or sodium. Maintain a healthy weight Body mass index (BMI) is used to identify weight problems. It estimates body fat based on height and weight. Your health care provider can help determine your BMI and help you achieve or maintain a healthy weight. Get regular exercise Get regular exercise. This is one of the most important things you can do for your health. Most adults should:  Exercise for at least 150 minutes each week. The exercise should increase your heart rate and make you sweat (moderate-intensity exercise).  Do strengthening exercises at least twice a week. This is in addition to the moderate-intensity exercise.  Spend less time sitting. Even light physical activity can be beneficial. Watch cholesterol and blood lipids Have your blood tested for lipids and cholesterol at 24 years of age, then have this test every 5 years. Have your cholesterol levels checked more often if:  Your lipid or cholesterol levels are high.  You are older than 24 years of age.  You are at high risk for heart disease. What should I know about cancer screening? Depending on your health history and family history, you may need to have cancer screening at various ages. This may include screening for:  Breast cancer.  Cervical cancer.  Colorectal cancer.  Skin cancer.  Lung cancer. What should I know about heart disease, diabetes, and high blood  pressure? Blood pressure and heart disease  High blood pressure causes heart disease and increases the risk of stroke. This is more likely to develop in people who have high blood pressure readings, are of African descent, or are overweight.  Have your blood pressure checked: ? Every 3-5 years if you are 18-39 years of age. ? Every year if you are 40 years old or older. Diabetes Have regular diabetes screenings. This checks your fasting blood sugar level. Have the screening done:  Once every three years after age 40 if you are at a normal weight and have a low risk for diabetes.  More often and at a younger age if you are overweight or have a high risk for diabetes. What should I know about preventing infection? Hepatitis B If you have a higher risk for hepatitis B, you should be screened for this virus. Talk with your health care provider to find out if you are at risk for hepatitis B infection. Hepatitis C Testing is recommended for:  Everyone born from 1945 through 1965.  Anyone with known risk factors for hepatitis C. Sexually transmitted infections (STIs)  Get screened for STIs, including gonorrhea and chlamydia, if: ? You are sexually active and are younger than 24 years of age. ? You are older than 24 years of age and your health care provider tells you that you are at risk for this type of infection. ? Your sexual activity has changed since you were last screened, and you are at increased risk for chlamydia or gonorrhea. Ask your health care provider if   you are at risk.  Ask your health care provider about whether you are at high risk for HIV. Your health care provider may recommend a prescription medicine to help prevent HIV infection. If you choose to take medicine to prevent HIV, you should first get tested for HIV. You should then be tested every 3 months for as long as you are taking the medicine. Pregnancy  If you are about to stop having your period (premenopausal) and  you may become pregnant, seek counseling before you get pregnant.  Take 400 to 800 micrograms (mcg) of folic acid every day if you become pregnant.  Ask for birth control (contraception) if you want to prevent pregnancy. Osteoporosis and menopause Osteoporosis is a disease in which the bones lose minerals and strength with aging. This can result in bone fractures. If you are 65 years old or older, or if you are at risk for osteoporosis and fractures, ask your health care provider if you should:  Be screened for bone loss.  Take a calcium or vitamin D supplement to lower your risk of fractures.  Be given hormone replacement therapy (HRT) to treat symptoms of menopause. Follow these instructions at home: Lifestyle  Do not use any products that contain nicotine or tobacco, such as cigarettes, e-cigarettes, and chewing tobacco. If you need help quitting, ask your health care provider.  Do not use street drugs.  Do not share needles.  Ask your health care provider for help if you need support or information about quitting drugs. Alcohol use  Do not drink alcohol if: ? Your health care provider tells you not to drink. ? You are pregnant, may be pregnant, or are planning to become pregnant.  If you drink alcohol: ? Limit how much you use to 0-1 drink a day. ? Limit intake if you are breastfeeding.  Be aware of how much alcohol is in your drink. In the [place] one drink equals one 12 oz bottle of beer (355 mL), one 5 oz glass of wine (148 mL), or one 1 oz glass of hard liquor (44 mL). General instructions  Schedule regular health, dental, and eye exams.  Stay current with your vaccines.  Tell your health care provider if: ? You often feel depressed. ? You have ever been abused or do not feel safe at home. Summary  Adopting a healthy lifestyle and getting preventive care are important in promoting health and wellness.  Follow your health care provider's instructions about healthy  diet, exercising, and getting tested or screened for diseases.  Follow your health care provider's instructions on monitoring your cholesterol and blood pressure. This information is not intended to replace advice given to you by your health care provider. Make sure you discuss any questions you have with your health care provider. Document Released: 10/29/2010 Document Revised: 04/08/2018 Document Reviewed: 04/08/2018 Elsevier Patient Education  2020 [employer]  

## 2019-03-15 ENCOUNTER — Telehealth (INDEPENDENT_AMBULATORY_CARE_PROVIDER_SITE_OTHER): Payer: Medicaid Other | Admitting: Family Medicine

## 2019-03-15 DIAGNOSIS — G8929 Other chronic pain: Secondary | ICD-10-CM

## 2019-03-15 DIAGNOSIS — J4520 Mild intermittent asthma, uncomplicated: Secondary | ICD-10-CM | POA: Diagnosis not present

## 2019-03-15 DIAGNOSIS — Z8742 Personal history of other diseases of the female genital tract: Secondary | ICD-10-CM | POA: Diagnosis not present

## 2019-03-15 DIAGNOSIS — M25512 Pain in left shoulder: Secondary | ICD-10-CM

## 2019-03-15 MED ORDER — ALBUTEROL SULFATE HFA 108 (90 BASE) MCG/ACT IN AERS: 2.0000 | INHALATION_SPRAY | Freq: Four times a day (QID) | RESPIRATORY_TRACT | 4 refills | Status: DC | PRN

## 2019-03-15 MED ORDER — FLUTICASONE-SALMETEROL 100-50 MCG/DOSE IN AEPB: 1.0000 | INHALATION_SPRAY | Freq: Two times a day (BID) | RESPIRATORY_TRACT | 4 refills | Status: DC

## 2019-03-15 NOTE — Progress Notes (Signed)
Virtual Visit via Video Note  I connected with Linda Berry on 03/15/19 at  8:50 AM EST by a video enabled telemedicine application and verified that I am speaking with the correct person using two identifiers.  Location: Patient: Home Provider: Home Office   I discussed the limitations of evaluation and management by telemedicine and the availability of in person appointments. The patient expressed understanding and agreed to proceed.  History of Present Illness:        23 year old female new to the practice who wishes to establish care.  She reports that she has had no recent primary care provider and has had to go to the emergency department for care over the past few years.  She is status post giving birth earlier this year to twins.  She reports that she was given her influenza immunization when she was 7 months pregnant, per chart she received influenza immunization in March 2020.  She reports a history of asthma which so far has been well controlled.  She states that her asthma was more severe in the past.  She does tend to have increased asthma symptoms in the fall, winter and spring.  She reports that smoke and cold temperatures causes her to have chest tightness.  In the past she was on Advair as a controller medication as well as albuterol to use as needed.  Due to her lack of insurance however she is not been on Advair in some time.  She would like to receive new prescription for this medication as well as albuterol inhaler.          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  She also has had issues with posterior dislocation of the shoulder as well as scoliosis.  She reports chronic issues with left shoulder pain and back pain for which she has had to go to the emergency department on multiple occasions in the past.  She also requests referral to gynecology due to her history of ovarian cyst.   She denies any current lower abdominal/pelvic pain.  She denies any current shortness of breath, no cough.  She has had no issues with chest pain or palpitations, no fever or chills, no current abdominal pain-no nausea/vomiting/diarrhea or constipation.  No urinary frequency or dysuria.  She does have fatigue related to being a new mom.    Observations/Objective: Patient appeared to be in no acute distress.  Patient was busy trying to hold both babies during the video visit.  She did not exhibit any shortness of breath, no coughing.  Patient did have visible difficulty with movement of the left arm/hand consistent with her reported history of left brachial plexus injury.  Assessment and Plan: 1. Mild intermittent asthma without complication She reports that she has had longstanding issues with asthma that has been fairly well controlled.  She was on Advair in the past but due to lack of insurance has not been on this medication in a while.  As her symptoms tend to get worse in the winter, she would like to have prescription for Advair as well as albuterol inhaler.  She reports that she had influenza immunization at 7 months during her pregnancy.  She was encouraged to schedule nurse visit to have influenza immunization due to her history of asthma.  The influenza immunization which she received in March will not cover her for this current flu season. - Fluticasone-Salmeterol (ADVAIR)  100-50 MCG/DOSE AEPB; Inhale 1 puff into the lungs 2 (two) times daily.  Dispense: 1 each; Refill: 4 - albuterol (VENTOLIN HFA) 108 (90 Base) MCG/ACT inhaler; Inhale 2 puffs into the lungs every 6 (six) hours as needed for wheezing or shortness of breath.  Dispense: 18 g; Refill: 4  2. Chronic left shoulder pain; 4.  Brachial plexus birth injury Patient reports chronic issues with left shoulder pain due to brachial plexus injury at birth as well as scoliosis and winging of the scapula.  Patient will be referred to pain  medicine/physical medicine and rehab for further evaluation and treatment  3. History of ovarian cyst She reports a history of ovarian cyst for which she would like to be referred to GYN for further evaluation and treatment  Follow Up Instructions:Return in about 4 months (around 07/13/2019) for chronic issues and as needed.    I discussed the assessment and treatment plan with the patient. The patient was provided an opportunity to ask questions and all were answered. The patient agreed with the plan and demonstrated an understanding of the instructions.   The patient was advised to call back or seek an in-person evaluation if the symptoms worsen or if the condition fails to improve as anticipated.  I provided 14  minutes of non-face-to-face time during this encounter.   Antony Blackbird, MD

## 2019-03-15 NOTE — Progress Notes (Signed)
Here to get established.  Asthma is well controlled currently. Does have occasional back spasms & L arm pain.

## 2019-03-17 ENCOUNTER — Encounter: Payer: Self-pay | Admitting: Family Medicine

## 2019-04-19 ENCOUNTER — Ambulatory Visit: Payer: Medicaid Other | Admitting: Obstetrics and Gynecology

## 2019-04-19 ENCOUNTER — Encounter: Payer: Self-pay | Admitting: Obstetrics and Gynecology

## 2019-05-18 ENCOUNTER — Telehealth: Payer: Medicaid Other | Admitting: Physician Assistant

## 2019-05-18 DIAGNOSIS — N760 Acute vaginitis: Secondary | ICD-10-CM | POA: Diagnosis not present

## 2019-05-18 MED ORDER — FLUCONAZOLE 150 MG PO TABS: 150.0000 mg | ORAL_TABLET | Freq: Once | ORAL | 0 refills | Status: AC

## 2019-05-18 NOTE — Progress Notes (Signed)
We are sorry that you are not feeling well. Here is how we plan to help! Based on what you shared with me it looks like you: May have a yeast vaginosis  Vaginosis is an inflammation of the vagina that can result in discharge, itching and pain. The cause is usually a change in the normal balance of vaginal bacteria or an infection. Vaginosis can also result from reduced estrogen levels after menopause.  *It is important that since you have had a new sexual partner in the last two months, that you stop sexual intercourse and you obtain STD testing prior to resuming intercourse with any partner. This is because we cannot confirm over an E-visit if your symptoms are related to an STD or not.  STD testing is free from the health department or can be charged with a visit to primary care or urgent care.  The most common causes of vaginosis are:   Bacterial vaginosis which results from an overgrowth of one on several organisms that are normally present in your vagina.   Yeast infections which are caused by a naturally occurring fungus called candida.   Vaginal atrophy (atrophic vaginosis) which results from the thinning of the vagina from reduced estrogen levels after menopause.   Trichomoniasis which is caused by a parasite and is commonly transmitted by sexual intercourse.  Factors that increase your risk of developing vaginosis include: Marland Kitchen Medications, such as antibiotics and steroids . Uncontrolled diabetes . Use of hygiene products such as bubble bath, vaginal spray or vaginal deodorant . Douching . Wearing damp or tight-fitting clothing . Using an intrauterine device (IUD) for birth control . Hormonal changes, such as those associated with pregnancy, birth control pills or menopause . Sexual activity . Having a sexually transmitted infection  Your treatment plan is A single Diflucan (fluconazole) 150mg  tablet once.  I have electronically sent this prescription into the pharmacy that you  have chosen.  Diflucan will treat yeast vaginosis but will not treat other causes of vaginosis such as sexually transmitted infections  Be sure to take all of the medication as directed. Stop taking any medication if you develop a rash, tongue swelling or shortness of breath. Mothers who are breast feeding should consider pumping and discarding their breast milk while on these antibiotics. However, there is no consensus that infant exposure at these doses would be harmful.  Remember that medication creams can weaken latex condoms.   HOME CARE:  Good hygiene may prevent some types of vaginosis from recurring and may relieve some symptoms:  . Avoid baths, hot tubs and whirlpool spas. Rinse soap from your outer genital area after a shower, and dry the area well to prevent irritation. Don't use scented or harsh soaps, such as those with deodorant or antibacterial action. Marland Kitchen Avoid irritants. These include scented tampons and pads. . Wipe from front to back after using the toilet. Doing so avoids spreading fecal bacteria to your vagina.  Other things that may help prevent vaginosis include:  Marland Kitchen Don't douche. Your vagina doesn't require cleansing other than normal bathing. Repetitive douching disrupts the normal organisms that reside in the vagina and can actually increase your risk of vaginal infection. Douching won't clear up a vaginal infection. . Use a latex condom. Both female and female latex condoms may help you avoid infections spread by sexual contact. . Wear cotton underwear. Also wear pantyhose with a cotton crotch. If you feel comfortable without it, skip wearing underwear to bed. Yeast thrives in Marland Kitchen  environments Your symptoms should improve in the next day or two.  GET HELP RIGHT AWAY IF:  . You have pain in your lower abdomen ( pelvic area or over your ovaries) . You develop nausea or vomiting . You develop a fever . Your discharge changes or worsens . You have persistent pain  with intercourse . You develop shortness of breath, a rapid pulse, or you faint.  These symptoms could be signs of problems or infections that need to be evaluated by a medical provider now.  MAKE SURE YOU    Understand these instructions.  Will watch your condition.  Will get help right away if you are not doing well or get worse.  Your e-visit answers were reviewed by a board certified advanced clinical practitioner to complete your personal care plan. Depending upon the condition, your plan could have included both over the counter or prescription medications. Please review your pharmacy choice to make sure that you have choses a pharmacy that is open for you to pick up any needed prescription, Your safety is important to [place]. If you have drug allergies check your prescription carefully.   You can use MyChart to ask questions about today's visit, request a non-urgent call back, or ask for a work or school excuse for 24 hours related to this e-Visit. If it has been greater than 24 hours you will need to follow up with your provider, or enter a new e-Visit to address those concerns. You will get a MyChart message within the next two days asking about your experience. I hope that your e-visit has been valuable and will speed your recovery.   6 minutes spent on chart

## 2019-06-02 ENCOUNTER — Ambulatory Visit: Payer: Medicaid Other | Admitting: Physical Medicine & Rehabilitation

## 2019-06-07 ENCOUNTER — Other Ambulatory Visit: Payer: Self-pay

## 2019-06-07 ENCOUNTER — Encounter
Payer: Medicaid Other | Attending: Physical Medicine and Rehabilitation | Admitting: Physical Medicine and Rehabilitation

## 2019-06-07 ENCOUNTER — Encounter: Payer: Self-pay | Admitting: Physical Medicine and Rehabilitation

## 2019-06-07 DIAGNOSIS — M25512 Pain in left shoulder: Secondary | ICD-10-CM | POA: Insufficient documentation

## 2019-06-07 DIAGNOSIS — M419 Scoliosis, unspecified: Secondary | ICD-10-CM | POA: Diagnosis present

## 2019-06-07 DIAGNOSIS — G8929 Other chronic pain: Secondary | ICD-10-CM | POA: Insufficient documentation

## 2019-06-07 MED ORDER — PREGABALIN 25 MG PO CAPS: 25.0000 mg | ORAL_CAPSULE | Freq: Two times a day (BID) | ORAL | 1 refills | Status: DC

## 2019-06-07 NOTE — Progress Notes (Signed)
Subjective:    Patient ID: Ty Hilts, female    DOB: 10-Dec-1995, 24 y.o.   MRN: [id number]  HPI  Mrs. Pelto is a 24 year old woman who presents to establish care for chronic left shoulder pain.  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 She has had her left shoulder dislocated and relocated in the past. She describes shooting pain down her left arm that occur every day without position changes. She has 25 107 month old girl twins and lifting them has aggravated her shoulder pain. She has tried Gabapentin in the past and was on quite high doses but grew tolerant to the medication after years of taking it so it was discontinued. She feels that Tylenol does not help. She bore the pain during her recent pregnancy as did not want to take medications while pregnant. She says her most recent shoulder imaging was 18 years ago.   Pain Inventory Average Pain 8 Pain Right Now 7 My pain is sharp, dull, stabbing, tingling and aching  In the last 24 hours, has pain interfered with the following? General activity 8 Relation with others 5 Enjoyment of life 6 What TIME of day is your pain at its worst? morning Sleep (in general) Fair  Pain is worse with: walking, bending, inactivity, standing and some activites Pain improves with: heat/ice, pacing activities, medication, TENS and injections Relief from Meds: 7  Mobility walk without assistance ability to climb steps?  yes do you drive?  yes  Function not employed: date last employed .,  Neuro/Psych weakness numbness tingling spasms  Prior Studies Any changes since last visit?  no  Physicians involved in your care Any changes since last visit?  no   Family History  Problem Relation Age of Onset  . Diabetes Mother   . Hypertension Mother   . Huntington's disease Mother   . Ulcers Father   . Cancer Father    Social History    Socioeconomic History  . Marital status: Single    Spouse name: Not on file  . Number of children: Not on file  . Years of education: Not on file  . Highest education level: Not on file  Occupational History  . Occupation: Geophysicist/field seismologist    Comment: [employer]  Tobacco Use  . Smoking status: Former Smoker    Types: Cigarettes    Quit date: 05/09/2018    Years since quitting: 1.0  . Smokeless tobacco: Never Used  Substance and Sexual Activity  . Alcohol use: Not Currently  . Drug use: Not Currently    Types: Marijuana    Comment: last use December  . Sexual activity: Yes    Birth control/protection: None  Other Topics Concern  . Not on file  Social History Narrative  . Not on file   Social Determinants of Health   Financial Resource Strain:   . Difficulty of Paying Living Expenses: Not on file  Food Insecurity: No Food Insecurity  . Worried About Charity fundraiser in the Last Year: Never true  . Ran Out of Food in the Last Year: Never true  Transportation Needs: No Transportation Needs  . Lack of Transportation (Medical): No  . Lack of Transportation (Non-Medical): No  Physical Activity:   . Days of Exercise per Week: Not on file  . Minutes of Exercise per Session: Not on file  Stress: Stress Concern Present  . Feeling  of Stress : Very much  Social Connections:   . Frequency of Communication with Friends and Family: Not on file  . Frequency of Social Gatherings with Friends and Family: Not on file  . Attends Religious Services: Not on file  . Active Member of Clubs or Organizations: Not on file  . Attends Banker Meetings: Not on file  . Marital Status: Not on file   Past Surgical History:  Procedure Laterality Date  . arm surgery     brachial plexus  . WISDOM TOOTH EXTRACTION     Past Medical History:  Diagnosis Date  . Asthma   . 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 Scoliosis    BP 138/87   Pulse 82   Temp (!) 97.3 F (36.3 C)   Ht  5' (1.524 m)   Wt 161 lb (73 kg)   LMP 05/11/2019   SpO2 98%   BMI 31.44 kg/m   Opioid Risk Score:   Fall Risk Score:  `1  Depression screen PHQ 2/9  Depression screen [facility] 2/9 10/08/2018 07/07/2018 06/23/2018  Decreased Interest 0 1 0  Down, Depressed, Hopeless 0 0 0  PHQ - 2 Score 0 1 0  Altered sleeping 1 2 1   Tired, decreased energy 0 1 1  Change in appetite 0 0 0  Feeling bad or failure about yourself  0 0 0  Trouble concentrating 0 0 0  Moving slowly or fidgety/restless 0 0 0  Suicidal thoughts 0 0 0  PHQ-9 Score 1 4 2      Review of Systems  Constitutional: Negative.   HENT: Negative.   Eyes: Negative.   Respiratory: Negative.   Cardiovascular: Negative.   Gastrointestinal: Negative.   Endocrine: Negative.   Genitourinary: Negative.   Musculoskeletal: Positive for arthralgias and myalgias.  Skin: Negative.   Allergic/Immunologic: Negative.   Neurological: Positive for weakness and numbness.  Hematological: Negative.   Psychiatric/Behavioral: Negative.   All other systems reviewed and are negative.      Objective:   Physical Exam Gen: no distress, normal appearing HEENT: oral mucosa pink and moist, NCAT Cardio: Reg rate Chest: normal effort, normal rate of breathing Abd: soft, non-distended Ext: no edema Skin: intact Neuro: AOx3 Musculoskeletal:  -Left arm atrophy.  -Numbness and tingling over medial aspect of forearm and dorsum of 1st and 2nd digits.  -Mild detroscoliosis Psych: pleasant, normal affect    Assessment & Plan:  Mrs. Quiocho is a 24 year old woman who presents to establish care for chronic left shoulder pain.  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 She tolerates the pain well and is very functional with a great attitude.   --Lyrica 25mg  BID. She had benefit from gabapentin for years but grew tolerate to the medication. Will trial Lyrica. Advised of side effects. PDMP  reviewed and she has no controlled substances.   -Left shoulder XR to assess for RTC impingement vs arthritis which may have arisen from altered biomechanics given left arm atrophy.   --Would likely benefit from physical therapy. Will schedule based on imaging workup and response to medication.  Scoliosis: --Not significantly bothering patient. Monitor for now.   RTC in 1 month to assess progress with above treatment.

## 2019-06-14 ENCOUNTER — Ambulatory Visit: Payer: Medicaid Other | Admitting: Family Medicine

## 2019-06-22 ENCOUNTER — Ambulatory Visit (INDEPENDENT_AMBULATORY_CARE_PROVIDER_SITE_OTHER)
Admission: RE | Admit: 2019-06-22 | Discharge: 2019-06-22 | Disposition: A | Discharge status: Home / Self Care | Payer: Medicaid Other | Source: Ambulatory Visit

## 2019-06-22 DIAGNOSIS — G8929 Other chronic pain: Secondary | ICD-10-CM | POA: Diagnosis not present

## 2019-06-22 DIAGNOSIS — M545 Low back pain: Secondary | ICD-10-CM

## 2019-06-22 MED ORDER — IBUPROFEN 800 MG PO TABS: 800.0000 mg | ORAL_TABLET | Freq: Three times a day (TID) | ORAL | 0 refills | Status: DC | PRN

## 2019-06-22 MED ORDER — CYCLOBENZAPRINE HCL 10 MG PO TABS: 10.0000 mg | ORAL_TABLET | Freq: Two times a day (BID) | ORAL | 0 refills | Status: DC | PRN

## 2019-06-22 NOTE — Discharge Instructions (Addendum)
Take the prescribed ibuprofen as needed for your pain.  Take the muscle relaxer Flexeril as needed for muscle spasm; do not drive, operate machinery, or drink alcohol with this medication as it may make you drowsy.    Follow up with your primary care provider if your pain is not improving.  Go to the emergency department if you have worsening pain or develop new symptoms such as difficulty with urination, weakness, numbness, loss of control of your bladder or bowels, fever, chills or other concerns.

## 2019-06-22 NOTE — ED Provider Notes (Signed)
Virtual Visit via Video Note:  Linda Berry  initiated request for Telemedicine visit with [facility] Urgent Care team. I connected with Linda Berry  on 06/22/2019 at 3:19 PM  for a synchronized telemedicine visit using a video enabled HIPPA compliant telemedicine application. I verified that I am speaking with Linda Berry  using two identifiers. Mickie Bail, NP  was physically located in a [facility] Urgent care site and Linda Berry was located at a different location.   The limitations of evaluation and management by telemedicine as well as the availability of in-person appointments were discussed. Patient was informed that she  may incur a bill ( including co-pay) for this virtual visit encounter. Linda Berry  expressed understanding and gave verbal consent to proceed with virtual visit.     History of Present Illness:Linda Berry  is a 24 y.o. female presents for evaluation of back pain x since age 37; worse x 1 year.  The pain is bilateral lower back; sharp; nonradiating; worse with lifting and bending; improves with lying down and rest.  She denies numbness, weakness, paresthesias, bowel/bladder incontinence, or other symptoms.  She was seen for this issue on 06/07/2019 by Dr. Carlis Abbott, Phys Med and has follow up with her on 07/07/2019.  She states she needs pain management until her next appointment.   She attempts treatment with [facility].  She denies pregnancy or breastfeeding.      Allergies  Allergen Reactions  . Ammonia Hives     Past Medical History:  Diagnosis Date  . Asthma   . 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 Scoliosis      Social History   Tobacco Use  . Smoking status: Former Smoker    Types: Cigarettes    Quit date: 05/09/2018    Years since quitting: 1.1  . Smokeless tobacco: Never Used  Substance Use Topics  . Alcohol use: Not Currently  . Drug use: Not Currently    Types: Marijuana    Comment: last use December     ROS: as stated in HPI.  All other systems reviewed and negative.      Observations/Objective: Physical Exam  VITALS: Patient denies fever. GENERAL: Alert, appears well and in no acute distress. HEENT: Atraumatic. NECK: Normal movements of the head and neck. CARDIOPULMONARY: No increased WOB. Speaking in clear sentences. I:E ratio WNL.  MS: Moves all visible extremities without noticeable abnormality. PSYCH: Pleasant and cooperative, well-groomed. Speech normal rate and rhythm. Affect is appropriate. Insight and judgement are appropriate. Attention is focused, linear, and appropriate.  NEURO: CN grossly intact. Oriented as arrived to appointment on time with no prompting. Moves both UE equally.  SKIN: No obvious lesions, wounds, erythema, or cyanosis noted on face or hands.   Assessment and Plan:    ICD-10-CM   1. Chronic bilateral low back pain without sciatica  M54.5    G89.29        Follow Up Instructions: Treating with ibuprofen and Flexeril.  Precautions for drowsiness with Flexeril discussed with patient.  Instructed her to follow-up with her primary care provider if her pain is not improving and to keep her appointment as scheduled on 07/07/2019 with Dr. Dalene Carrow.  Instructed her to go to the emergency department if she has increased pain or develops new symptoms.  Patient agrees to plan of care.    I discussed the assessment and treatment plan with the patient. The patient was  provided an opportunity to ask questions and all were answered. The patient agreed with the plan and demonstrated an understanding of the instructions.   The patient was advised to call back or seek an in-person evaluation if the symptoms worsen or if the condition fails to improve as anticipated.      Sharion Balloon, NP  06/22/2019 3:19 PM         Sharion Balloon, NP 06/22/19 1520

## 2019-07-07 ENCOUNTER — Other Ambulatory Visit: Payer: Self-pay

## 2019-07-07 ENCOUNTER — Encounter: Payer: Self-pay | Admitting: Physical Medicine and Rehabilitation

## 2019-07-07 ENCOUNTER — Encounter
Payer: Medicaid Other | Attending: Physical Medicine and Rehabilitation | Admitting: Physical Medicine and Rehabilitation

## 2019-07-07 DIAGNOSIS — M25512 Pain in left shoulder: Secondary | ICD-10-CM | POA: Diagnosis not present

## 2019-07-07 DIAGNOSIS — M419 Scoliosis, unspecified: Secondary | ICD-10-CM | POA: Diagnosis not present

## 2019-07-07 DIAGNOSIS — G8929 Other chronic pain: Secondary | ICD-10-CM | POA: Diagnosis not present

## 2019-07-07 DIAGNOSIS — M6208 Separation of muscle (nontraumatic), other site: Secondary | ICD-10-CM | POA: Insufficient documentation

## 2019-07-07 MED ORDER — CYCLOBENZAPRINE HCL 10 MG PO TABS: 10.0000 mg | ORAL_TABLET | Freq: Two times a day (BID) | ORAL | 0 refills | Status: DC | PRN

## 2019-07-07 NOTE — Progress Notes (Signed)
Subjective:    Patient ID: Linda Berry, female    DOB: [birth year], 24 y.o.   MRN: [id number]  HPI  Linda Berry is a very pleasant 24 year old woman who presents for follow-up of chronic left shoulder pain.  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 She has had her left shoulder dislocated and relocated in the past. She describes shooting pain down her left arm that occur every day without position changes. She has 47 98 month old girl twins and lifting them has aggravated her shoulder pain. She has tried Gabapentin in the past and was on quite high doses but grew tolerant to the medication after years of taking it so it was discontinued. She feels that Tylenol does not help. She bore the pain during her recent pregnancy as did not want to take medications while pregnant. She says her most recent shoulder imaging was 18 years ago.   Since last visit she has also developed low back pain. The Lyrica did help her but caused her to have dry mouth so she had to stop using it. She went to the ED for back pain and was prescribed Flexeril which she has found to be helpful. She has been taking it every other night for pain relief and it also helps her to sleep. She does not have to wake with her babies as they are sleeping though the night. She is unable to tolerate it during the day due to sedation.   She also discusses her symptoms of diastasis recti. She would be interested in physical therapy to address this.   Pain Inventory Average Pain 7 Pain Right Now 5 My pain is constant, sharp, dull, stabbing and aching  In the last 24 hours, has pain interfered with the following? General activity 6 Relation with others 4 Enjoyment of life 3 What TIME of day is your pain at its worst? evening Sleep (in general) Fair  Pain is worse with: bending, sitting, standing and some activites Pain improves with: heat/ice,  pacing activities, medication and TENS Relief from Meds: 5  Mobility walk without assistance how many minutes can you walk? 30 ability to climb steps?  yes do you drive?  yes  Function not employed: date last employed . I need assistance with the following:  meal prep, household duties and shopping  Neuro/Psych weakness numbness tingling spasms  Prior Studies Any changes since last visit?  no  Physicians involved in your care Any changes since last visit?  no   Family History  Problem Relation Age of Onset  . Diabetes Mother   . Hypertension Mother   . Huntington's disease Mother   . Ulcers Father   . Cancer Father    Social History   Socioeconomic History  . Marital status: Single    Spouse name: Not on file  . Number of children: Not on file  . Years of education: Not on file  . Highest education level: Not on file  Occupational History  . Occupation: Hospital doctor    Comment: [employer]  Tobacco Use  . Smoking status: Former Smoker    Types: Cigarettes    Quit date: 05/09/2018    Years since quitting: 1.1  . Smokeless tobacco: Never Used  Substance and Sexual Activity  . Alcohol use: Not Currently  . Drug use: Not Currently    Types: Marijuana    Comment: last use December  .  Sexual activity: Yes    Birth control/protection: None  Other Topics Concern  . Not on file  Social History Narrative  . Not on file   Social Determinants of Health   Financial Resource Strain:   . Difficulty of Paying Living Expenses: Not on file  Food Insecurity: No Food Insecurity  . Worried About Charity fundraiser in the Last Year: Never true  . Ran Out of Food in the Last Year: Never true  Transportation Needs:   . Lack of Transportation (Medical): Not on file  . Lack of Transportation (Non-Medical): Not on file  Physical Activity:   . Days of Exercise per Week: Not on file  . Minutes of Exercise per Session: Not on file  Stress: Stress Concern Present  . Feeling of  Stress : Very much  Social Connections:   . Frequency of Communication with Friends and Family: Not on file  . Frequency of Social Gatherings with Friends and Family: Not on file  . Attends Religious Services: Not on file  . Active Member of Clubs or Organizations: Not on file  . Attends Archivist Meetings: Not on file  . Marital Status: Not on file   Past Surgical History:  Procedure Laterality Date  . arm surgery     brachial plexus  . WISDOM TOOTH EXTRACTION     Past Medical History:  Diagnosis Date  . Asthma   . 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 Scoliosis    BP 128/81   Pulse 84   Temp 98.5 F (36.9 C)   Ht 5' (1.524 m)   Wt 162 lb 9.6 oz (73.8 kg)   SpO2 96%   BMI 31.76 kg/m   Opioid Risk Score:   Fall Risk Score:  `1  Depression screen PHQ 2/9  Depression screen [facility] 2/9 10/08/2018 07/07/2018 06/23/2018  Decreased Interest 0 1 0  Down, Depressed, Hopeless 0 0 0  PHQ - 2 Score 0 1 0  Altered sleeping 1 2 1   Tired, decreased energy 0 1 1  Change in appetite 0 0 0  Feeling bad or failure about yourself  0 0 0  Trouble concentrating 0 0 0  Moving slowly or fidgety/restless 0 0 0  Suicidal thoughts 0 0 0  PHQ-9 Score 1 4 2    Review of Systems  Neurological: Positive for weakness and numbness.       Tingling spasms  All other systems reviewed and are negative.      Objective:   Physical Exam  Gen: no distress, normal appearing HEENT: oral mucosa pink and moist, NCAT Cardio: Reg rate Chest: normal effort, normal rate of breathing Abd: soft, non-distended Ext: no edema Skin: intact Neuro: AOx3 Musculoskeletal:  -Left arm atrophy.  -Numbness and tingling over medial aspect of forearm and dorsum of 1st and 2nd digits.  -Mild detroscoliosis -Full lumbar flexion and extension with pain at end range.  Psych: pleasant, normal affect      Assessment & Plan:  Linda Berry is a 24 year old woman who presents to establish care for  chronic left shoulder pain.  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 She tolerates the pain well and is very functional with a great attitude.   --She did not tolerate Lyrica well due to dry mouth. She had benefit from gabapentin for years but grew tolerate to the medication. She has benefited from Flexeril and  would like a refill of this.   -Left shoulder XR to assess for RTC impingement vs arthritis which may have arisen from altered biomechanics given left arm atrophy.   --Would likely benefit from physical therapy, particularly for her diastasis recti. I will find a therapist who specializes in this condition and get back to her.   Scoliosis: --Not significantly bothering patient. Monitor for now.   RTC in 1 month to assess progress with above treatment.

## 2019-07-08 ENCOUNTER — Encounter: Payer: Self-pay | Admitting: Obstetrics and Gynecology

## 2019-07-08 ENCOUNTER — Ambulatory Visit (INDEPENDENT_AMBULATORY_CARE_PROVIDER_SITE_OTHER): Payer: Medicaid Other | Admitting: Obstetrics and Gynecology

## 2019-07-08 VITALS — BP 118/81 | HR 112 | Wt 161.0 lb

## 2019-07-08 DIAGNOSIS — Z8742 Personal history of other diseases of the female genital tract: Secondary | ICD-10-CM | POA: Insufficient documentation

## 2019-07-08 DIAGNOSIS — M6208 Separation of muscle (nontraumatic), other site: Secondary | ICD-10-CM | POA: Diagnosis not present

## 2019-07-08 NOTE — Progress Notes (Signed)
Obstetrics and Gynecology Visit Return Patient Evaluation  Appointment Date: 07/08/2019  Primary Care Provider: Patient, No Pcp Per  OBGYN Clinic: [facility]  Chief Complaint: abdominal muscle separation  History of Present Illness:  Linda Berry is a 24 y.o. with above Cc. Pt is s/p august 2020 vag delivery.    Patient made the visit a while ago and had some pain and thought it felt like prior ovarian cyst pain but that pain resolved. Currently, she feels like she has some muscle separation in her midline belly. She also had a more intense period recently and wondering if that's normal.   Review of Systems:  as noted in the History of Present Illness.  Patient Active Problem List   Diagnosis Date Noted  . History of ovarian cyst 07/08/2019  . Migraine headache 07/07/2018  . 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 Scoliosis 06/23/2018  . Asthma affecting pregnancy, antepartum 06/23/2018   Medications:  Linda Berry had no medications administered during this visit. Current Outpatient Medications  Medication Sig Dispense Refill  . albuterol (VENTOLIN HFA) 108 (90 Base) MCG/ACT inhaler Inhale 2 puffs into the lungs every 6 (six) hours as needed for wheezing or shortness of breath. 18 g 4  . cyclobenzaprine (FLEXERIL) 10 MG tablet Take 1 tablet (10 mg total) by mouth 2 (two) times daily as needed for muscle spasms. 20 tablet 0  . Fluticasone-Salmeterol (ADVAIR) 100-50 MCG/DOSE AEPB Inhale 1 puff into the lungs 2 (two) times daily. 1 each 4  . ibuprofen (ADVIL) 800 MG tablet Take 1 tablet (800 mg total) by mouth every 8 (eight) hours as needed. 21 tablet 0  . pregabalin (LYRICA) 25 MG capsule Take 1 capsule (25 mg total) by mouth 2 (two) times daily. 60 capsule 1  . Prenatal Vit-Fe Fumarate-FA (MULTIVITAMIN-PRENATAL) 27-0.8 MG TABS tablet Take 1 tablet by mouth daily at 12 noon.     No current facility-administered medications  for this visit.    Allergies: is allergic to ammonia.  Physical Exam:  BP 118/81   Pulse (!) 112   Wt 161 lb (73 kg)   LMP 06/25/2019 (Approximate)   BMI 31.44 kg/m  Body mass index is 31.44 kg/m. General appearance: Well nourished, well developed female in no acute distress.  Abdomen: diffusely non tender to palpation, non distended, and no masses. Slight midline separation at the midline rectus abdominus muscle about halfway from the suprapubic area to the umbilicus.  Neuro/Psych:  Normal mood and affect.    Assessment: pt stable  Plan:  1. Diastasis of rectus abdominis Recommend abdominal/core exercises. Pt stopped breastfeeding/pumping last month. I told her that her periods can be different for the first few months after stopping feeding and if it persists after 3 months to let us know   RTC: PRN  Cornelia Copa MD Attending Center for [facility])

## 2019-07-08 NOTE — Patient Instructions (Signed)
Postpartum rectus diastasis exercises

## 2019-07-16 NOTE — Addendum Note (Signed)
Addended by: Horton Chin on: 07/16/2019 05:02 PM   Modules accepted: Orders

## 2019-08-04 ENCOUNTER — Ambulatory Visit: Payer: Medicaid Other | Admitting: Physical Medicine and Rehabilitation

## 2019-08-10 ENCOUNTER — Encounter
Payer: Medicaid Other | Attending: Physical Medicine and Rehabilitation | Admitting: Physical Medicine and Rehabilitation

## 2019-08-10 DIAGNOSIS — M419 Scoliosis, unspecified: Secondary | ICD-10-CM | POA: Insufficient documentation

## 2019-08-10 DIAGNOSIS — M6208 Separation of muscle (nontraumatic), other site: Secondary | ICD-10-CM | POA: Insufficient documentation

## 2019-08-10 DIAGNOSIS — G8929 Other chronic pain: Secondary | ICD-10-CM | POA: Insufficient documentation

## 2019-08-10 DIAGNOSIS — M25512 Pain in left shoulder: Secondary | ICD-10-CM | POA: Insufficient documentation

## 2020-01-25 ENCOUNTER — Other Ambulatory Visit: Payer: Self-pay

## 2020-01-25 ENCOUNTER — Other Ambulatory Visit (HOSPITAL_COMMUNITY)
Admission: RE | Admit: 2020-01-25 | Discharge: 2020-01-25 | Disposition: A | Discharge status: Home / Self Care | Payer: Medicaid Other | Source: Ambulatory Visit | Attending: Family Medicine | Admitting: Family Medicine

## 2020-01-25 ENCOUNTER — Ambulatory Visit (INDEPENDENT_AMBULATORY_CARE_PROVIDER_SITE_OTHER): Payer: Medicaid Other | Admitting: Lactation Services

## 2020-01-25 ENCOUNTER — Encounter: Payer: Self-pay | Admitting: Lactation Services

## 2020-01-25 VITALS — BP 139/67 | HR 61 | Ht 60.0 in | Wt 140.7 lb

## 2020-01-25 DIAGNOSIS — Z202 Contact with and (suspected) exposure to infections with a predominantly sexual mode of transmission: Secondary | ICD-10-CM

## 2020-01-25 NOTE — Progress Notes (Signed)
Patient here for concerns of vaginal irritation for the last few days. She reports she used an shared toy and experienced irritation afterwards. She would like to be checked for STD.   She denies discharge or odor. She has no known STD exposures. She was informed that she will see results in My Chart and will be notified of any abnormal results and recommendation for treatment. Vaginal swab obtained after instructions given.   Patient with no further questions or concerns.

## 2020-01-26 LAB — CERVICOVAGINAL ANCILLARY ONLY
Bacterial Vaginitis (gardnerella): NEGATIVE
Candida Glabrata: NEGATIVE
Candida Vaginitis: NEGATIVE
Chlamydia: NEGATIVE
Comment: NEGATIVE
Comment: NEGATIVE
Comment: NEGATIVE
Comment: NEGATIVE
Comment: NEGATIVE
Comment: NORMAL
Neisseria Gonorrhea: NEGATIVE
Trichomonas: NEGATIVE

## 2020-01-26 LAB — HEPATITIS C ANTIBODY: Hep C Virus Ab: <0.1 s/co ratio (ref 0.0–0.9)

## 2020-01-26 LAB — HIV ANTIBODY (ROUTINE TESTING W REFLEX): HIV Screen 4th Generation wRfx: NONREACTIVE

## 2020-01-26 LAB — HEPATITIS B SURFACE ANTIGEN: Hepatitis B Surface Ag: NEGATIVE

## 2020-01-27 NOTE — Progress Notes (Signed)
I have reviewed this chart and agree with the RN/CMA assessment and management.    K. Meryl Chalonda Schlatter, M.D. Attending [facility]   

## 2020-02-06 ENCOUNTER — Telehealth: Payer: Medicaid Other | Admitting: Family

## 2020-02-06 DIAGNOSIS — B373 Candidiasis of vulva and vagina: Secondary | ICD-10-CM | POA: Diagnosis not present

## 2020-02-06 DIAGNOSIS — B370 Candidal stomatitis: Secondary | ICD-10-CM

## 2020-02-06 DIAGNOSIS — B3731 Acute candidiasis of vulva and vagina: Secondary | ICD-10-CM

## 2020-02-06 MED ORDER — NYSTATIN 100000 UNIT/ML MT SUSP: 5.0000 mL | Freq: Four times a day (QID) | OROMUCOSAL | 0 refills | Status: DC

## 2020-02-06 MED ORDER — FLUCONAZOLE 150 MG PO TABS: 150.0000 mg | ORAL_TABLET | ORAL | 0 refills | Status: DC | PRN

## 2020-02-06 NOTE — Progress Notes (Signed)
E-Visit for Mouth Ulcers  We are sorry that you are not feeling well.  Here is how we plan to help!  Based on what you have shared with me, it appears that you do have  Oral thrush.   I have sent nystatin solution to the pharmacy.   Mouth ulcers are painful areas in the mouth and gums. These are also known as canker sores.  They can occur anywhere inside the mouth. While mostly harmless, mouth ulcers can be extremely uncomfortable and may make it difficult to eat, drink, and brush your teeth.  You may have more than 1 ulcer and they can vary and change in size. Mouth ulcers are not contagious and should not be confused with cold sores.  Cold sores appear on the lip or around the outside of the mouth and often begin with a tingling, burning or itching sensation.   While the exact causes are unknown, some common causes and factors that may aggravate mouth ulcers include:  Genetics - Sometimes mouth ulcers run in families  High alcohol intake  Acidic foods such as citrus fruits like pineapple, grapefruit, orange fruits/juices, may aggravate mouth ulcers  Other foods high in acidity or spice such as coffee, chocolate, chips, pretzels, eggs, nuts, cheese  Quitting smoking  Injury caused by biting the tongue or inside of the cheek  Diet lacking in B-12, zinc, folic acid or iron  Female hormone shifts with menstruation  Excessive fatigue, emotional stress or anxiety Prevention:  Talk to your doctor if you are taking meds that are known to cause mouth ulcers such as:   Anti-inflammatory drugs (for example Ibuprofen, Naproxen sodium), pain killers, Beta blockers, Oral nicotine replacement drugs, Some street drugs (heroin).    Avoid allowing any tablets to dissolve in your mouth that are meant to swallowed whole  Avoid foods/drinks that trigger or worsen symptoms  Keep your mouth clean with daily brushing and flossing  Home Care:  The goal with treatment is to ease the pain where  ulcers occur and help them heal as quickly as possible.  There is no medical treatment to prevent mouth ulcers from coming back or recurring.   Avoid spicy and acidic foods  Eat soft foods and avoid rough, crunchy foods  Avoid chewing gum  Do not use toothpaste that contains sodium lauryl sulphite  Use a straw to drink which helps avoid liquids toughing the ulcers near the front of your mouth  Use a very soft toothbrush  If you have dentures or dental hardware that you feel is not fitting well or contributing to his, please see your dentist.  Use saltwater mouthwash which helps healing. Dissolve a  teaspoon of salt in a glass of warm water. Swish around your mouth and spit it out. This can be used as needed if it is soothing.   GET HELP RIGHT AWAY IF:  Persistent ulcers require checking IN PERSON (face to face). Any mouth lesion lasting longer than a month should be seen by your DENTIST as soon as possible for evaluation for possible oral cancer.  If you have a non-painful ulcer in 1 or more areas of your mouth  Ulcers that are spreading, are very large or particularly painful  Ulcers last longer than one week without improving on treatment  If you develop a fever, swollen glands and begin to feel unwell  Ulcers that developed after starting a new medication MAKE SURE YOU:  Understand these instructions.  Will watch your condition.  Will  get help right away if you are not doing well or get worse.  Your e-visit answers were reviewed by a board certified advanced clinical practitioner to complete your personal care plan.  Depending upon the condition, your plan could have included both over the counter or prescription medications.    Please review your pharmacy choice.  Be sure that the pharmacy you have chosen is open so that you can pick up your prescription now.  If there is a problem, you can message your provider in MyChart to have the prescription routed to another  pharmacy.    Your safety is important to [place].  If you have drug allergies check our prescription carefully.  For the next 24 hours you can use MyChart to ask questions about today's visit, request a non-urgent call back, or ask for a work or school excuse from your e-visit provider.  You will get an email with a survey asking about your experience and to give [place] any feedback.  I hope that your e-visit has been valuable and will speed your recovery.  Approximately 5 minutes was spent documenting and reviewing patient's chart.

## 2020-02-06 NOTE — Progress Notes (Signed)

## 2020-02-10 ENCOUNTER — Other Ambulatory Visit: Payer: Self-pay

## 2020-02-10 ENCOUNTER — Ambulatory Visit
Admission: RE | Admit: 2020-02-10 | Discharge: 2020-02-10 | Disposition: A | Discharge status: Home / Self Care | Payer: Medicaid Other | Source: Ambulatory Visit

## 2020-02-10 VITALS — BP 120/68 | HR 67 | Temp 98.5°F | Resp 18

## 2020-02-10 DIAGNOSIS — L723 Sebaceous cyst: Secondary | ICD-10-CM

## 2020-02-10 NOTE — ED Triage Notes (Signed)
Pt here for abscess or cyst under left axillary area; pt sts present x 9 months and sometimes has some drainage

## 2020-02-10 NOTE — ED Provider Notes (Signed)
[facility]    CSN: [id number] Arrival date & time: 02/10/20  1642      History   Chief Complaint Chief Complaint  Patient presents with  . Appointment    1700  . Abscess    HPI Linda Berry is a 24 y.o. female.   Presents with a 9 year history of recurrent cyst, that is painful at times. It is in the left axilla. No drainage, warmth or erythema is noted. No fevers.      Past Medical History:  Diagnosis Date  . Asthma   . 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 IUGR (intrauterine growth restriction) affecting care of mother 10/29/2018  . Scoliosis     Patient Active Problem List   Diagnosis Date Noted  . History of ovarian cyst 07/08/2019  . Migraine headache 07/07/2018  . 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 Scoliosis 06/23/2018  . Asthma affecting pregnancy, antepartum 06/23/2018    Past Surgical History:  Procedure Laterality Date  . arm surgery     brachial plexus  . WISDOM TOOTH EXTRACTION      OB History    Gravida  1   Para  1   Term      Preterm  1   AB      Living  2     SAB      TAB      Ectopic      Multiple  1   Live Births  2            Home Medications    Prior to Admission medications   Medication Sig Start Date End Date Taking? Authorizing Provider  albuterol (VENTOLIN HFA) 108 (90 Base) MCG/ACT inhaler Inhale 2 puffs into the lungs every 6 (six) hours as needed for wheezing or shortness of breath. 03/15/19   Fulp, Cammie, MD  cyclobenzaprine (FLEXERIL) 10 MG tablet Take 1 tablet (10 mg total) by mouth 2 (two) times daily as needed for muscle spasms. Patient not taking: Reported on 02/10/2020 07/07/19   Horton Chin, MD  fluconazole (DIFLUCAN) 150 MG tablet Take 1 tablet (150 mg total) by mouth every three (3) days as needed. Patient not taking: Reported on 02/10/2020 02/06/20   Jannifer Rodney A, FNP  Fluticasone-Salmeterol (ADVAIR) 100-50 MCG/DOSE AEPB Inhale  1 puff into the lungs 2 (two) times daily. 03/15/19   Fulp, Cammie, MD  ibuprofen (ADVIL) 800 MG tablet Take 1 tablet (800 mg total) by mouth every 8 (eight) hours as needed. 06/22/19   Mickie Bail, NP  nystatin (MYCOSTATIN) 100000 UNIT/ML suspension Take 5 mLs (500,000 Units total) by mouth 4 (four) times daily. 02/06/20   Junie Spencer, FNP  pregabalin (LYRICA) 25 MG capsule Take 1 capsule (25 mg total) by mouth 2 (two) times daily. 06/07/19   Raulkar, Drema Pry, MD  Prenatal Vit-Fe Fumarate-FA (MULTIVITAMIN-PRENATAL) 27-0.8 MG TABS tablet Take 1 tablet by mouth daily at 12 noon.    [provider]    Family History Family History  Problem Relation Age of Onset  . Diabetes Mother   . Hypertension Mother   . Huntington's disease Mother   . Ulcers Father   . Cancer Father     Social History Social History   Tobacco Use  . Smoking status: Former Smoker    Types: Cigarettes    Quit date: 05/09/2018    Years since quitting: 1.7  .  Smokeless tobacco: Never Used  Vaping Use  . Vaping Use: Never used  Substance Use Topics  . Alcohol use: Not Currently  . Drug use: Not Currently    Types: Marijuana    Comment: last use December     Allergies   Ammonia   Review of Systems Review of Systems  All other systems reviewed and are negative.    Physical Exam Triage Vital Signs ED Triage Vitals  Enc Vitals Group     BP 02/10/20 1703 120/68     Pulse Rate 02/10/20 1703 67     Resp 02/10/20 1703 18     Temp 02/10/20 1703 98.5 F (36.9 C)     Temp Source 02/10/20 1703 Oral     SpO2 02/10/20 1703 97 %     Weight --      Height --      Head Circumference --      Peak Flow --      Pain Score 02/10/20 1704 6     Pain Loc --      Pain Edu? --      Excl. in GC? --    No data found.  Updated Vital Signs BP 120/68 (BP Location: Right Arm)   Pulse 67   Temp 98.5 F (36.9 C) (Oral)   Resp 18   SpO2 97%   Visual Acuity Right Eye Distance:   Left Eye  Distance:   Bilateral Distance:    Right Eye Near:   Left Eye Near:    Bilateral Near:     Physical Exam Vitals and nursing note reviewed.  Constitutional:      Appearance: Normal appearance.  HENT:     Head: Normocephalic.  Pulmonary:     Effort: Pulmonary effort is normal.  Skin:    General: Skin is warm and dry.     Comments: Left axillary nodular cyst, without noted fluid, erythema, warmth or fluctuance.   Neurological:     General: No focal deficit present.     Mental Status: She is alert.  Psychiatric:        Mood and Affect: Mood normal.        Behavior: Behavior normal.      UC Treatments / Results  Labs (all labs ordered are listed, but only abnormal results are displayed) Labs Reviewed - No data to display  EKG   Radiology No results found.  Procedures Procedures (including critical care time)  Medications Ordered in UC Medications - No data to display  Initial Impression / Assessment and Plan / UC Course  I have reviewed the triage vital signs and the nursing notes.  Pertinent labs & imaging results that were available during my care of the patient were reviewed by me and considered in my medical decision making (see chart for details).     No evidence of an abscess or signs of infection. Given her past surgery history, and deepness of this cyst, would recommend [place] surgery to evaluate. She will call for an appt.  Final Clinical Impressions(s) / UC Diagnoses   Final diagnoses:  Sebaceous cyst of left axilla     Discharge Instructions     No signs of infection is noted. Based on the overall duration and deepness of the cyst, suggest having this removed through a surgeon. Call for an appt.    ED Prescriptions    None     PDMP not reviewed this encounter.   Riki Sheer, PA-C 02/10/20 1724

## 2020-02-10 NOTE — Discharge Instructions (Addendum)
No signs of infection is noted. Based on the overall duration and deepness of the cyst, suggest having this removed through a surgeon. Call for an appt.

## 2020-02-13 ENCOUNTER — Telehealth: Payer: Medicaid Other | Admitting: Nurse Practitioner

## 2020-02-13 DIAGNOSIS — N760 Acute vaginitis: Secondary | ICD-10-CM | POA: Diagnosis not present

## 2020-02-13 DIAGNOSIS — B9689 Other specified bacterial agents as the cause of diseases classified elsewhere: Secondary | ICD-10-CM

## 2020-02-13 MED ORDER — CLINDAMYCIN PHOSPHATE 2 % VA CREA: 1.0000 | TOPICAL_CREAM | Freq: Every day | VAGINAL | 0 refills | Status: AC

## 2020-02-13 NOTE — Progress Notes (Signed)
We are sorry that you are not feeling well. Here is how we plan to help! Based on what you shared with me it looks like you: May have a vaginosis due to bacteria  Vaginosis is an inflammation of the vagina that can result in discharge, itching and pain. The cause is usually a change in the normal balance of vaginal bacteria or an infection. Vaginosis can also result from reduced estrogen levels after menopause.  The most common causes of vaginosis are:   Bacterial vaginosis which results from an overgrowth of one on several organisms that are normally present in your vagina.   Yeast infections which are caused by a naturally occurring fungus called candida.   Vaginal atrophy (atrophic vaginosis) which results from the thinning of the vagina from reduced estrogen levels after menopause.   Trichomoniasis which is caused by a parasite and is commonly transmitted by sexual intercourse.  Factors that increase your risk of developing vaginosis include: Marland Kitchen Medications, such as antibiotics and steroids . Uncontrolled diabetes . Use of hygiene products such as bubble bath, vaginal spray or vaginal deodorant . Douching . Wearing damp or tight-fitting clothing . Using an intrauterine device (IUD) for birth control . Hormonal changes, such as those associated with pregnancy, birth control pills or menopause . Sexual activity . Having a sexually transmitted infection  Your treatment plan is Clindamycin vaginal cream 5 grams applied vaginally for 7 days.  I have electronically sent this prescription into the pharmacy that you have chosen. IIf your symptoms continue to persist after completing this medication, you will need to follow up with your primary care physician for further evaluation.  Be sure to take all of the medication as directed. Stop taking any medication if you develop a rash, tongue swelling or shortness of breath. Mothers who are breast feeding should consider pumping and discarding  their breast milk while on these antibiotics. However, there is no consensus that infant exposure at these doses would be harmful.  Remember that medication creams can weaken latex condoms. Marland Kitchen   HOME CARE:  Good hygiene may prevent some types of vaginosis from recurring and may relieve some symptoms:  . Avoid baths, hot tubs and whirlpool spas. Rinse soap from your outer genital area after a shower, and dry the area well to prevent irritation. Don't use scented or harsh soaps, such as those with deodorant or antibacterial action. Marland Kitchen Avoid irritants. These include scented tampons and pads. . Wipe from front to back after using the toilet. Doing so avoids spreading fecal bacteria to your vagina.  Other things that may help prevent vaginosis include:  Marland Kitchen Don't douche. Your vagina doesn't require cleansing other than normal bathing. Repetitive douching disrupts the normal organisms that reside in the vagina and can actually increase your risk of vaginal infection. Douching won't clear up a vaginal infection. . Use a latex condom. Both female and female latex condoms may help you avoid infections spread by sexual contact. . Wear cotton underwear. Also wear pantyhose with a cotton crotch. If you feel comfortable without it, skip wearing underwear to bed. Yeast thrives in [employer] Your symptoms should improve in the next day or two.  GET HELP RIGHT AWAY IF:  . You have pain in your lower abdomen ( pelvic area or over your ovaries) . You develop nausea or vomiting . You develop a fever . Your discharge changes or worsens . You have persistent pain with intercourse . You develop shortness of breath, a rapid pulse,  or you faint.  These symptoms could be signs of problems or infections that need to be evaluated by a medical provider now.  MAKE SURE YOU    Understand these instructions.  Will watch your condition.  Will get help right away if you are not doing well or get  worse.  Your e-visit answers were reviewed by a board certified advanced clinical practitioner to complete your personal care plan. Depending upon the condition, your plan could have included both over the counter or prescription medications. Please review your pharmacy choice to make sure that you have choses a pharmacy that is open for you to pick up any needed prescription, Your safety is important to [place]. If you have drug allergies check your prescription carefully.   You can use MyChart to ask questions about today's visit, request a non-urgent call back, or ask for a work or school excuse for 24 hours related to this e-Visit. If it has been greater than 24 hours you will need to follow up with your provider, or enter a new e-Visit to address those concerns. You will get a MyChart message within the next two days asking about your experience. I hope that your e-visit has been valuable and will speed your recovery.  I have spent at least 5 minutes reviewing and documenting in the patient's chart.

## 2020-02-18 ENCOUNTER — Other Ambulatory Visit: Payer: Self-pay | Admitting: General Surgery

## 2020-02-18 DIAGNOSIS — R2232 Localized swelling, mass and lump, left upper limb: Secondary | ICD-10-CM | POA: Diagnosis not present

## 2020-02-29 ENCOUNTER — Other Ambulatory Visit: Payer: Self-pay

## 2020-02-29 ENCOUNTER — Telehealth: Payer: Medicaid Other | Admitting: Physician Assistant

## 2020-02-29 ENCOUNTER — Ambulatory Visit
Admission: RE | Admit: 2020-02-29 | Discharge: 2020-02-29 | Disposition: A | Discharge status: Home / Self Care | Payer: Medicaid Other | Source: Ambulatory Visit | Attending: Emergency Medicine | Admitting: Emergency Medicine

## 2020-02-29 VITALS — BP 154/96 | HR 112 | Temp 98.4°F | Resp 18

## 2020-02-29 DIAGNOSIS — M419 Scoliosis, unspecified: Secondary | ICD-10-CM

## 2020-02-29 DIAGNOSIS — L723 Sebaceous cyst: Secondary | ICD-10-CM

## 2020-02-29 DIAGNOSIS — M549 Dorsalgia, unspecified: Secondary | ICD-10-CM

## 2020-02-29 MED ORDER — IBUPROFEN 800 MG PO TABS: 800.0000 mg | ORAL_TABLET | Freq: Three times a day (TID) | ORAL | 0 refills | Status: DC | PRN

## 2020-02-29 MED ORDER — CYCLOBENZAPRINE HCL 10 MG PO TABS: 10.0000 mg | ORAL_TABLET | Freq: Two times a day (BID) | ORAL | 0 refills | Status: DC | PRN

## 2020-02-29 NOTE — Progress Notes (Signed)
Based on what you shared with me, I feel your condition warrants further evaluation and I recommend that you be seen for a face to face office visit. I am concerned that your are experiencing numbness and weakness. This could be indicative of a more serious issue and I feel you need to be evaluated in person for this complaint.   NOTE: If you entered your credit card information for this eVisit, you will not be charged. You may see a "hold" on your card for the $35 but that hold will drop off and you will not have a charge processed.   If you are having a true medical emergency please call 911.      For an urgent face to face visit, [place] has five urgent care centers for your convenience:     [facility] at [facility] Directions [phone] [address] [place], [place] [address] . 10 am - 6pm Monday - Friday    [facility]) Get Driving Directions [phone] [address] [place], [place] [address] . 10 am to 8 pm Monday-Friday . 12 pm to 8 pm [facility] Urgent Care at [facility] Get Driving Directions [phone] [address] [place] [address], [address] [place], [place] [address] . 8 am to 8 pm Monday-Friday . 9 am to 6 pm Saturday . 11 am to 6 pm Sunday     [facility] at [facility] Get Driving Directions  [phone] [address] [place], [place] [address] . 8 am to 8 pm Monday-Friday . 8 am to 4 pm [facility] at [facility] Directions [phone] [address] [place], [place] [address] . 12 pm to 6 pm Monday-Friday      Your e-visit answers were reviewed by a board certified advanced clinical practitioner to complete your personal care plan.  Thank you for using e-Visits.    Approximately 5 minutes was spent documenting and reviewing patient's chart.

## 2020-02-29 NOTE — Discharge Instructions (Signed)
Heat therapy (hot compress, warm wash rag, hot showers, etc.) can help relax muscles and soothe muscle aches. Cold therapy (ice packs) can be used to help swelling both after injury and after prolonged use of areas of chronic pain/aches.  Pain medication:  350 mg-1000 mg of Tylenol (acetaminophen) and/or 200 mg - 800 mg of Advil (ibuprofen, Motrin) every 8 hours as needed.  May alternate between the two throughout the day as they are generally safe to take together.  DO NOT exceed more than 3000 mg of Tylenol or 3200 mg of ibuprofen in a 24 hour period as this could damage your stomach, kidneys, liver, or increase your bleeding risk.  May take muscle relaxer as needed for severe pain / spasm.  (This medication may cause you to become tired so it is important you do not drink alcohol or operate heavy machinery while on this medication.  Recommend your first dose to be taken before bedtime to monitor for side effects safely)  Important to follow up with specialist(s) below for further evaluation/management if your symptoms persist or worsen. 

## 2020-02-29 NOTE — ED Provider Notes (Signed)
[facility]    CSN: [id number] Arrival date & time: 02/29/20  1457      History   Chief Complaint Chief Complaint  Patient presents with  . Back Pain    HPI Linda Berry is a 24 y.o. female  presents for acute on chronic thoracic and cervical back pain with muscle spasms.  States pain is tight, achy, nonradiating.  Has tried OTC medications without relief.  Denies trauma/injury to the affected area and does not recall an inciting event.  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  States this feels like her normal pain flares.  States that she does not currently have Flexeril at home: Requesting refill.  States this and ibuprofen 800 mg typically helps.  Denies fever, saddle area anesthesia, lower extremity numbness/weakness, urinary retention, fecal incontinence.  Past Medical History:  Diagnosis Date  . Asthma   . 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 IUGR (intrauterine growth restriction) affecting care of mother 10/29/2018  . Scoliosis     Patient Active Problem List   Diagnosis Date Noted  . History of ovarian cyst 07/08/2019  . Migraine headache 07/07/2018  . 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 Scoliosis 06/23/2018  . Asthma affecting pregnancy, antepartum 06/23/2018    Past Surgical History:  Procedure Laterality Date  . arm surgery     brachial plexus  . WISDOM TOOTH EXTRACTION      OB History    Gravida  1   Para  1   Term      Preterm  1   AB      Living  2     SAB      TAB      Ectopic      Multiple  1   Live Births  2            Home Medications    Prior to Admission medications   Medication Sig Start Date End Date Taking? Authorizing Provider  albuterol (VENTOLIN HFA) 108 (90 Base) MCG/ACT inhaler Inhale 2 puffs into the lungs every 6 (six) hours as needed for wheezing or shortness of breath. 03/15/19   Fulp, Cammie, MD  cyclobenzaprine (FLEXERIL) 10 MG tablet Take 1  tablet (10 mg total) by mouth 2 (two) times daily as needed for muscle spasms. 02/29/20   Hall-Potvin, [place], PA-C  Fluticasone-Salmeterol (ADVAIR) 100-50 MCG/DOSE AEPB Inhale 1 puff into the lungs 2 (two) times daily. 03/15/19   Fulp, Cammie, MD  ibuprofen (ADVIL) 800 MG tablet Take 1 tablet (800 mg total) by mouth every 8 (eight) hours as needed. 02/29/20   Hall-Potvin, [place], PA-C    Family History Family History  Problem Relation Age of Onset  . Diabetes Mother   . Hypertension Mother   . Huntington's disease Mother   . Ulcers Father   . Cancer Father     Social History Social History   Tobacco Use  . Smoking status: Former Smoker    Types: Cigarettes    Quit date: 05/09/2018    Years since quitting: 1.8  . Smokeless tobacco: Never Used  Vaping Use  . Vaping Use: Never used  Substance Use Topics  . Alcohol use: Not Currently  . Drug use: Not Currently    Types: Marijuana    Comment: last use December     Allergies   Ammonia   Review of Systems Review of Systems  Constitutional: Negative for fatigue and  fever.  HENT: Negative for ear pain, sinus pain, sore throat and voice change.   Eyes: Negative for pain, redness and visual disturbance.  Respiratory: Negative for cough and shortness of breath.   Cardiovascular: Negative for chest pain and palpitations.  Gastrointestinal: Negative for abdominal pain, diarrhea and vomiting.  Musculoskeletal: Positive for back pain. Negative for arthralgias and myalgias.  Skin: Negative for rash and wound.  Neurological: Negative for syncope and headaches.     Physical Exam Triage Vital Signs ED Triage Vitals  Enc Vitals Group     BP 02/29/20 1505 (!) 154/96     Pulse Rate 02/29/20 1505 (!) 112     Resp 02/29/20 1505 18     Temp 02/29/20 1505 98.4 F (36.9 C)     Temp Source 02/29/20 1505 Oral     SpO2 02/29/20 1505 96 %     Weight --      Height --      Head Circumference --      Peak Flow --      Pain Score  02/29/20 1506 8     Pain Loc --      Pain Edu? --      Excl. in GC? --    No data found.  Updated Vital Signs BP (!) 154/96 (BP Location: Left Arm)   Pulse (!) 112   Temp 98.4 F (36.9 C) (Oral)   Resp 18   LMP 02/28/2020   SpO2 96%   Breastfeeding No   Visual Acuity Right Eye Distance:   Left Eye Distance:   Bilateral Distance:    Right Eye Near:   Left Eye Near:    Bilateral Near:     Physical Exam Constitutional:      General: She is not in acute distress. HENT:     Head: Normocephalic and atraumatic.  Eyes:     General: No scleral icterus.    Pupils: Pupils are equal, round, and reactive to light.  Cardiovascular:     Rate and Rhythm: Normal rate.  Pulmonary:     Effort: Pulmonary effort is normal.  Musculoskeletal:        General: Tenderness present. No swelling. Normal range of motion.     Comments: Thoracocervical right lateral scoliosis noted.  Spinous process nontender.  Patient does have tenderness along paraspinals.  No spasm appreciated.  NVI  Skin:    Coloration: Skin is not jaundiced or pale.  Neurological:     Mental Status: She is alert and oriented to person, place, and time.      UC Treatments / Results  Labs (all labs ordered are listed, but only abnormal results are displayed) Labs Reviewed - No data to display  EKG   Radiology No results found.  Procedures Procedures (including critical care time)  Medications Ordered in UC Medications - No data to display  Initial Impression / Assessment and Plan / UC Course  I have reviewed the triage vital signs and the nursing notes.  Pertinent labs & imaging results that were available during my care of the patient were reviewed by me and considered in my medical decision making (see chart for details).     We will treat MSK pain second to scoliosis as below.  Provided contact information for local specialist.  Return precautions discussed, pt verbalized understanding and is agreeable  to plan. Final Clinical Impressions(s) / UC Diagnoses   Final diagnoses:  Scoliosis of cervicothoracic spine, unspecified scoliosis type  Discharge Instructions     Heat therapy (hot compress, warm wash rag, hot showers, etc.) can help relax muscles and soothe muscle aches. Cold therapy (ice packs) can be used to help swelling both after injury and after prolonged use of areas of chronic pain/aches.  Pain medication:  350 mg-1000 mg of Tylenol (acetaminophen) and/or 200 mg - 800 mg of Advil (ibuprofen, Motrin) every 8 hours as needed.  May alternate between the two throughout the day as they are generally safe to take together.  DO NOT exceed more than 3000 mg of Tylenol or 3200 mg of ibuprofen in a 24 hour period as this could damage your stomach, kidneys, liver, or increase your bleeding risk.  May take muscle relaxer as needed for severe pain / spasm.  (This medication may cause you to become tired so it is important you do not drink alcohol or operate heavy machinery while on this medication.  Recommend your first dose to be taken before bedtime to monitor for side effects safely)  Important to follow up with specialist(s) below for further evaluation/management if your symptoms persist or worsen.    ED Prescriptions    Medication Sig Dispense Auth. Provider   cyclobenzaprine (FLEXERIL) 10 MG tablet Take 1 tablet (10 mg total) by mouth 2 (two) times daily as needed for muscle spasms. 20 tablet Hall-Potvin, [place], PA-C   ibuprofen (ADVIL) 800 MG tablet Take 1 tablet (800 mg total) by mouth every 8 (eight) hours as needed. 21 tablet Hall-Potvin, [place], PA-C     I have reviewed the PDMP during this encounter.   Hall-Potvin, [place], [place] 02/29/20 1656

## 2020-02-29 NOTE — ED Triage Notes (Signed)
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 Pt c/o lower/upper back pain, neck, and lt shoulder pain for over 2wks. Denies new injuries.

## 2020-04-10 ENCOUNTER — Encounter (HOSPITAL_BASED_OUTPATIENT_CLINIC_OR_DEPARTMENT_OTHER): Payer: Self-pay | Admitting: General Surgery

## 2020-04-10 ENCOUNTER — Other Ambulatory Visit: Payer: Self-pay

## 2020-04-13 ENCOUNTER — Other Ambulatory Visit (HOSPITAL_COMMUNITY): Payer: Medicaid Other

## 2020-04-13 ENCOUNTER — Telehealth: Payer: Medicaid Other | Admitting: Emergency Medicine

## 2020-04-13 DIAGNOSIS — N760 Acute vaginitis: Secondary | ICD-10-CM

## 2020-04-13 MED ORDER — FLUCONAZOLE 150 MG PO TABS: 150.0000 mg | ORAL_TABLET | Freq: Once | ORAL | 0 refills | Status: AC

## 2020-04-13 NOTE — Progress Notes (Signed)
We are sorry that you are not feeling well. Here is how we plan to help! Based on what you shared with me it looks like you: May have a yeast vaginosis  Vaginosis is an inflammation of the vagina that can result in discharge, itching and pain. The cause is usually a change in the normal balance of vaginal bacteria or an infection. Vaginosis can also result from reduced estrogen levels after menopause.  The most common causes of vaginosis are:   Bacterial vaginosis which results from an overgrowth of one on several organisms that are normally present in your vagina.   Yeast infections which are caused by a naturally occurring fungus called candida.   Vaginal atrophy (atrophic vaginosis) which results from the thinning of the vagina from reduced estrogen levels after menopause.   Trichomoniasis which is caused by a parasite and is commonly transmitted by sexual intercourse.  Factors that increase your risk of developing vaginosis include: . Medications, such as antibiotics and steroids . Uncontrolled diabetes . Use of hygiene products such as bubble bath, vaginal spray or vaginal deodorant . Douching . Wearing damp or tight-fitting clothing . Using an intrauterine device (IUD) for birth control . Hormonal changes, such as those associated with pregnancy, birth control pills or menopause . Sexual activity . Having a sexually transmitted infection  Your treatment plan is A single Diflucan (fluconazole) 150mg tablet once.  I have electronically sent this prescription into the pharmacy that you have chosen.  Be sure to take all of the medication as directed. Stop taking any medication if you develop a rash, tongue swelling or shortness of breath. Mothers who are breast feeding should consider pumping and discarding their breast milk while on these antibiotics. However, there is no consensus that infant exposure at these doses would be harmful.  Remember that medication creams can weaken latex  condoms. .   HOME CARE:  Good hygiene may prevent some types of vaginosis from recurring and may relieve some symptoms:  . Avoid baths, hot tubs and whirlpool spas. Rinse soap from your outer genital area after a shower, and dry the area well to prevent irritation. Don't use scented or harsh soaps, such as those with deodorant or antibacterial action. . Avoid irritants. These include scented tampons and pads. . Wipe from front to back after using the toilet. Doing so avoids spreading fecal bacteria to your vagina.  Other things that may help prevent vaginosis include:  . Don't douche. Your vagina doesn't require cleansing other than normal bathing. Repetitive douching disrupts the normal organisms that reside in the vagina and can actually increase your risk of vaginal infection. Douching won't clear up a vaginal infection. . Use a latex condom. Both female and female latex condoms may help you avoid infections spread by sexual contact. . Wear cotton underwear. Also wear pantyhose with a cotton crotch. If you feel comfortable without it, skip wearing underwear to bed. Yeast thrives in moist environments Your symptoms should improve in the next day or two.  GET HELP RIGHT AWAY IF:  . You have pain in your lower abdomen ( pelvic area or over your ovaries) . You develop nausea or vomiting . You develop a fever . Your discharge changes or worsens . You have persistent pain with intercourse . You develop shortness of breath, a rapid pulse, or you faint.  These symptoms could be signs of problems or infections that need to be evaluated by a medical provider now.  MAKE SURE YOU      Understand these instructions.  Will watch your condition.  Will get help right away if you are not doing well or get worse.  Your e-visit answers were reviewed by a board certified advanced clinical practitioner to complete your personal care plan. Depending upon the condition, your plan could have included  both over the counter or prescription medications. Please review your pharmacy choice to make sure that you have choses a pharmacy that is open for you to pick up any needed prescription, Your safety is important to us. If you have drug allergies check your prescription carefully.   You can use MyChart to ask questions about today's visit, request a non-urgent call back, or ask for a work or school excuse for 24 hours related to this e-Visit. If it has been greater than 24 hours you will need to follow up with your provider, or enter a new e-Visit to address those concerns. You will get a MyChart message within the next two days asking about your experience. I hope that your e-visit has been valuable and will speed your recovery.  Approximately 5 minutes was used in reviewing the patient's chart, questionnaire, prescribing medications, and documentation.  

## 2020-04-14 ENCOUNTER — Other Ambulatory Visit (HOSPITAL_COMMUNITY)
Admission: RE | Admit: 2020-04-14 | Discharge: 2020-04-14 | Disposition: A | Discharge status: Home / Self Care | Payer: Medicaid Other | Source: Ambulatory Visit | Attending: General Surgery | Admitting: General Surgery

## 2020-04-14 DIAGNOSIS — Z20822 Contact with and (suspected) exposure to covid-19: Secondary | ICD-10-CM | POA: Diagnosis not present

## 2020-04-14 DIAGNOSIS — Z01812 Encounter for preprocedural laboratory examination: Secondary | ICD-10-CM | POA: Insufficient documentation

## 2020-04-15 LAB — SARS CORONAVIRUS 2 (TAT 6-24 HRS): SARS Coronavirus 2: NEGATIVE

## 2020-04-17 ENCOUNTER — Encounter (HOSPITAL_BASED_OUTPATIENT_CLINIC_OR_DEPARTMENT_OTHER)
Admission: RE | Disposition: A | Discharge status: Home / Self Care | Payer: Self-pay | Source: Home / Self Care | Attending: General Surgery

## 2020-04-17 ENCOUNTER — Ambulatory Visit (HOSPITAL_BASED_OUTPATIENT_CLINIC_OR_DEPARTMENT_OTHER): Payer: Medicaid Other | Admitting: Certified Registered"

## 2020-04-17 ENCOUNTER — Encounter (HOSPITAL_BASED_OUTPATIENT_CLINIC_OR_DEPARTMENT_OTHER): Payer: Self-pay | Admitting: General Surgery

## 2020-04-17 ENCOUNTER — Other Ambulatory Visit: Payer: Self-pay

## 2020-04-17 ENCOUNTER — Ambulatory Visit (HOSPITAL_BASED_OUTPATIENT_CLINIC_OR_DEPARTMENT_OTHER)
Admission: RE | Admit: 2020-04-17 | Discharge: 2020-04-17 | Disposition: A | Discharge status: Home / Self Care | Payer: Medicaid Other | Attending: General Surgery | Admitting: General Surgery

## 2020-04-17 DIAGNOSIS — Z79899 Other long term (current) drug therapy: Secondary | ICD-10-CM | POA: Diagnosis not present

## 2020-04-17 DIAGNOSIS — M419 Scoliosis, unspecified: Secondary | ICD-10-CM | POA: Diagnosis not present

## 2020-04-17 DIAGNOSIS — L720 Epidermal cyst: Secondary | ICD-10-CM | POA: Diagnosis not present

## 2020-04-17 DIAGNOSIS — J45909 Unspecified asthma, uncomplicated: Secondary | ICD-10-CM | POA: Diagnosis not present

## 2020-04-17 DIAGNOSIS — Z87891 Personal history of nicotine dependence: Secondary | ICD-10-CM | POA: Diagnosis not present

## 2020-04-17 DIAGNOSIS — R2232 Localized swelling, mass and lump, left upper limb: Secondary | ICD-10-CM | POA: Diagnosis not present

## 2020-04-17 HISTORY — PX: MASS EXCISION: SHX2000

## 2020-04-17 HISTORY — DX: Other specified postprocedural states: Z98.890

## 2020-04-17 HISTORY — DX: Nausea with vomiting, unspecified: R11.2

## 2020-04-17 LAB — POCT PREGNANCY, URINE: Preg Test, Ur: NEGATIVE

## 2020-04-17 SURGERY — EXCISION MASS
Anesthesia: General | Site: Axilla | Laterality: Left

## 2020-04-17 MED ORDER — ONDANSETRON HCL 4 MG/2ML IJ SOLN: 4.0000 mg | Freq: Once | INTRAMUSCULAR | Status: DC

## 2020-04-17 MED ORDER — FENTANYL CITRATE (PF) 100 MCG/2ML IJ SOLN
INTRAMUSCULAR | Status: DC | PRN
  Administered 2020-04-17 (×4): 25 ug via INTRAVENOUS

## 2020-04-17 MED ORDER — FENTANYL CITRATE (PF) 100 MCG/2ML IJ SOLN
25.0000 ug | INTRAMUSCULAR | Status: DC | PRN
  Administered 2020-04-17: 50 ug via INTRAVENOUS

## 2020-04-17 MED ORDER — CEFAZOLIN SODIUM-DEXTROSE 2-4 GM/100ML-% IV SOLN
2.0000 g | INTRAVENOUS | Status: AC
  Administered 2020-04-17: 09:00:00 2 g via INTRAVENOUS

## 2020-04-17 MED ORDER — PROPOFOL 10 MG/ML IV BOLUS
INTRAVENOUS | Status: AC
  Filled 2020-04-17: qty 40

## 2020-04-17 MED ORDER — ONDANSETRON HCL 4 MG/2ML IJ SOLN
INTRAMUSCULAR | Status: DC | PRN
  Administered 2020-04-17: 4 mg via INTRAVENOUS

## 2020-04-17 MED ORDER — LIDOCAINE 2% (20 MG/ML) 5 ML SYRINGE
INTRAMUSCULAR | Status: AC
  Filled 2020-04-17: qty 5

## 2020-04-17 MED ORDER — BUPIVACAINE HCL (PF) 0.25 % IJ SOLN
INTRAMUSCULAR | Status: DC | PRN
  Administered 2020-04-17: 2 mL

## 2020-04-17 MED ORDER — DEXAMETHASONE SODIUM PHOSPHATE 10 MG/ML IJ SOLN
INTRAMUSCULAR | Status: AC
  Filled 2020-04-17: qty 1

## 2020-04-17 MED ORDER — FENTANYL CITRATE (PF) 100 MCG/2ML IJ SOLN
INTRAMUSCULAR | Status: AC
  Filled 2020-04-17: qty 2

## 2020-04-17 MED ORDER — LACTATED RINGERS IV SOLN: INTRAVENOUS | Status: DC

## 2020-04-17 MED ORDER — DEXAMETHASONE SODIUM PHOSPHATE 10 MG/ML IJ SOLN
INTRAMUSCULAR | Status: DC | PRN
  Administered 2020-04-17: 10 mg via INTRAVENOUS

## 2020-04-17 MED ORDER — KETOROLAC TROMETHAMINE 15 MG/ML IJ SOLN
15.0000 mg | INTRAMUSCULAR | Status: AC
  Administered 2020-04-17: 08:00:00 15 mg via INTRAVENOUS

## 2020-04-17 MED ORDER — KETOROLAC TROMETHAMINE 15 MG/ML IJ SOLN
INTRAMUSCULAR | Status: AC
  Filled 2020-04-17: qty 1

## 2020-04-17 MED ORDER — OXYCODONE HCL 5 MG/5ML PO SOLN
5.0000 mg | Freq: Once | ORAL | Status: AC | PRN
Start: 2020-04-17 — End: 2020-04-17

## 2020-04-17 MED ORDER — MIDAZOLAM HCL 2 MG/2ML IJ SOLN
INTRAMUSCULAR | Status: AC
  Filled 2020-04-17: qty 2

## 2020-04-17 MED ORDER — SCOPOLAMINE 1 MG/3DAYS TD PT72
1.0000 | MEDICATED_PATCH | TRANSDERMAL | Status: DC
  Administered 2020-04-17: 08:00:00 1.5 mg via TRANSDERMAL

## 2020-04-17 MED ORDER — PROPOFOL 10 MG/ML IV BOLUS
INTRAVENOUS | Status: DC | PRN
  Administered 2020-04-17: 150 mg via INTRAVENOUS

## 2020-04-17 MED ORDER — CEFAZOLIN SODIUM-DEXTROSE 2-4 GM/100ML-% IV SOLN
INTRAVENOUS | Status: AC
  Filled 2020-04-17: qty 100

## 2020-04-17 MED ORDER — MIDAZOLAM HCL 5 MG/5ML IJ SOLN
INTRAMUSCULAR | Status: DC | PRN
  Administered 2020-04-17: 2 mg via INTRAVENOUS

## 2020-04-17 MED ORDER — OXYCODONE HCL 5 MG PO TABS
5.0000 mg | ORAL_TABLET | Freq: Once | ORAL | Status: AC | PRN
  Administered 2020-04-17: 5 mg via ORAL

## 2020-04-17 MED ORDER — ACETAMINOPHEN 500 MG PO TABS
1000.0000 mg | ORAL_TABLET | ORAL | Status: AC
  Administered 2020-04-17: 08:00:00 1000 mg via ORAL

## 2020-04-17 MED ORDER — LIDOCAINE HCL (CARDIAC) PF 100 MG/5ML IV SOSY
PREFILLED_SYRINGE | INTRAVENOUS | Status: DC | PRN
  Administered 2020-04-17: 60 mg via INTRAVENOUS

## 2020-04-17 MED ORDER — ACETAMINOPHEN 500 MG PO TABS
ORAL_TABLET | ORAL | Status: AC
  Filled 2020-04-17: qty 2

## 2020-04-17 MED ORDER — SCOPOLAMINE 1 MG/3DAYS TD PT72
MEDICATED_PATCH | TRANSDERMAL | Status: AC
  Filled 2020-04-17: qty 1

## 2020-04-17 MED ORDER — OXYCODONE HCL 5 MG PO TABS
ORAL_TABLET | ORAL | Status: AC
  Filled 2020-04-17: qty 1

## 2020-04-17 MED ORDER — ONDANSETRON HCL 4 MG/2ML IJ SOLN
INTRAMUSCULAR | Status: AC
  Filled 2020-04-17: qty 2

## 2020-04-17 SURGICAL SUPPLY — 31 items
ADH SKN CLS APL DERMABOND .7 (GAUZE/BANDAGES/DRESSINGS) ×1
APL PRP STRL LF DISP 70% ISPRP (MISCELLANEOUS) ×1
BLADE SURG 15 STRL LF DISP TIS (BLADE) ×1 IMPLANT
BLADE SURG 15 STRL SS (BLADE) ×3
CHLORAPREP W/TINT 26 (MISCELLANEOUS) ×3 IMPLANT
CLOSURE STERI-STRIP 1/2X4 (GAUZE/BANDAGES/DRESSINGS) ×1
CLSR STERI-STRIP ANTIMIC 1/2X4 (GAUZE/BANDAGES/DRESSINGS) ×2 IMPLANT
COVER BACK TABLE 60X90IN (DRAPES) ×3 IMPLANT
COVER MAYO STAND STRL (DRAPES) ×3 IMPLANT
DERMABOND ADVANCED (GAUZE/BANDAGES/DRESSINGS) ×2
DERMABOND ADVANCED .7 DNX12 (GAUZE/BANDAGES/DRESSINGS) ×1 IMPLANT
DRAPE LAPAROTOMY 100X72 PEDS (DRAPES) ×3 IMPLANT
DRAPE UTILITY XL STRL (DRAPES) ×3 IMPLANT
ELECT COATED BLADE 2.86 ST (ELECTRODE) ×3 IMPLANT
ELECT REM PT RETURN 9FT ADLT (ELECTROSURGICAL) ×3
ELECTRODE REM PT RTRN 9FT ADLT (ELECTROSURGICAL) ×1 IMPLANT
GLOVE BIO SURGEON STRL SZ7 (GLOVE) ×3 IMPLANT
GLOVE BIOGEL PI IND STRL 7.5 (GLOVE) ×1 IMPLANT
GLOVE BIOGEL PI INDICATOR 7.5 (GLOVE) ×2
GOWN STRL REUS W/ TWL LRG LVL3 (GOWN DISPOSABLE) ×3 IMPLANT
GOWN STRL REUS W/TWL LRG LVL3 (GOWN DISPOSABLE) ×9
NEEDLE HYPO 25X1 1.5 SAFETY (NEEDLE) ×3 IMPLANT
PACK BASIN DAY SURGERY FS (CUSTOM PROCEDURE TRAY) ×3 IMPLANT
PENCIL SMOKE EVACUATOR (MISCELLANEOUS) ×3 IMPLANT
SLEEVE SCD COMPRESS KNEE MED (MISCELLANEOUS) ×3 IMPLANT
SPONGE LAP 4X18 RFD (DISPOSABLE) ×3 IMPLANT
SUT MNCRL AB 4-0 PS2 18 (SUTURE) ×3 IMPLANT
SUT VIC AB 3-0 SH 27 (SUTURE) ×3
SUT VIC AB 3-0 SH 27X BRD (SUTURE) ×1 IMPLANT
SYR CONTROL 10ML LL (SYRINGE) ×3 IMPLANT
TOWEL GREEN STERILE FF (TOWEL DISPOSABLE) ×3 IMPLANT

## 2020-04-17 NOTE — Anesthesia Postprocedure Evaluation (Signed)
Anesthesia Post Note  Patient: Linda Berry  Procedure(s) Performed: EXCISION OF LEFT AXILLARY MASS (Left Axilla)     Patient location during evaluation: PACU Anesthesia Type: General Level of consciousness: awake and alert and oriented Pain management: pain level controlled Vital Signs Assessment: post-procedure vital signs reviewed and stable Respiratory status: spontaneous breathing, nonlabored ventilation and respiratory function stable Cardiovascular status: blood pressure returned to baseline and stable Postop Assessment: no apparent nausea or vomiting Anesthetic complications: no   No complications documented.  Last Vitals:  Vitals:   04/17/20 1015 04/17/20 1022  BP:  132/84  Pulse: (!) 49 (!) 52  Resp: (!) 23 16  Temp:  (!) 36.4 C  SpO2: 100% 100%    Last Pain:  Vitals:   04/17/20 1022  TempSrc:   PainSc: 7                  Casmir Auguste A.

## 2020-04-17 NOTE — Transfer of Care (Signed)
Immediate Anesthesia Transfer of Care Note  Patient: EMREE LOCICERO  Procedure(s) Performed: EXCISION OF LEFT AXILLARY MASS (Left Axilla)  Patient Location: PACU  Anesthesia Type:General  Level of Consciousness: awake, alert  and oriented  Airway & Oxygen Therapy: Patient Spontanous Breathing and Patient connected to face mask oxygen  Post-op Assessment: Report given to RN and Post -op Vital signs reviewed and stable  Post vital signs: Reviewed and stable  Last Vitals:  Vitals Value Taken Time  BP    Temp    Pulse 70 04/17/20 0945  Resp 22 04/17/20 0945  SpO2 99 % 04/17/20 0945  Vitals shown include unvalidated device data.  Last Pain:  Vitals:   04/17/20 0749  TempSrc: Oral  PainSc: 6       Patients Stated Pain Goal: 5 (04/17/20 0749)  Complications: No complications documented.

## 2020-04-17 NOTE — H&P (Signed)
  24 yof here with a left axillary mass for about a year that has been increasing in size. no infection, no drainage. is causing discomfort now. she would like to consider excision. 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    Past Surgical History  Shoulder Surgery  Left. Spinal Surgery - Neck   Diagnostic Studies History  Colonoscopy  never Mammogram  >3 years ago Pap Smear  1-5 years ago  Allergies No Known Drug Allergies   Allergies Reconciled   Medication History  Advair Diskus (100-50MCG/DOSE Aero Pow Br Act, Inhalation) Active. Albuterol Sulfate (Inhalation) Specific strength unknown - Active. Medications Reconciled  Pregnancy / Birth History Age at menarche  12 years. Contraceptive History  Intrauterine device, Oral contraceptives. Gravida  1 Length (months) of breastfeeding  3-6 Maternal age  67-25 Para  2 Regular periods   Other Problems  Arthritis  Asthma  Back Pain  High blood pressure  Other disease, cancer, significant illness    Review of Systems  General Present- Appetite Loss and Weight Loss. Not Present- Chills, Fatigue, Fever, Night Sweats and Weight Gain. Skin Present- Ulcer. Not Present- Change in Wart/Mole, Dryness, Hives, Jaundice, New Lesions, Non-Healing Wounds and Rash. HEENT Present- Seasonal Allergies. Not Present- Earache, Hearing Loss, Hoarseness, Nose Bleed, Oral Ulcers, Ringing in the Ears, Sinus Pain, Sore Throat, Visual Disturbances, Wears glasses/contact lenses and Yellow Eyes. Respiratory Not Present- Bloody sputum, Chronic Cough, Difficulty Breathing, Snoring and Wheezing. Breast Present- Breast Pain. Not Present- Breast Mass, Nipple Discharge and Skin Changes. Cardiovascular Not Present- Chest Pain, Difficulty Breathing Lying Down, Leg Cramps, Palpitations, Rapid Heart Rate, Shortness of Breath and Swelling of Extremities. Gastrointestinal Not Present- Abdominal Pain,  Bloating, Bloody Stool, Change in Bowel Habits, Chronic diarrhea, Constipation, Difficulty Swallowing, Excessive gas, Gets full quickly at meals, Hemorrhoids, Indigestion, Nausea, Rectal Pain and Vomiting. Female Genitourinary Not Present- Frequency, Nocturia, Painful Urination, Pelvic Pain and Urgency. Musculoskeletal Present- Back Pain, Joint Pain, Joint Stiffness, Muscle Pain and Muscle Weakness. Not Present- Swelling of Extremities. Neurological Not Present- Decreased Memory, Fainting, Headaches, Numbness, Seizures, Tingling, Tremor, Trouble walking and Weakness. Psychiatric Not Present- Anxiety, Bipolar, Change in Sleep Pattern, Depression, Fearful and Frequent crying. Endocrine Not Present- Cold Intolerance, Excessive Hunger, Hair Changes, Heat Intolerance, Hot flashes and New Diabetes. Hematology Not Present- Blood Thinners, Easy Bruising, Excessive bleeding, Gland problems, HIV and Persistent Infections.    Physical Exam  Breast Note: left axillary mass likely skin cyst about 2x3 cm in size mobile, associated with prior scar tissue from brachial plexus surgery    Assessment & Plan  AXILLARY MASS, LEFT (R22.32) Story: Left axillary mass excision This is most likely a benign cyst but getting larger associated wtih old scar. I think reasonable to remove this. would need to be done at surgery center with anesthesia. discussed risks, recovery.

## 2020-04-17 NOTE — Discharge Instructions (Signed)
[facility] Office Phone Number [phone]  POST OP INSTRUCTIONS Take 400 mg of ibuprofen every 8 hours or 650 mg tylenol every 6 hours for next 72 hours then as needed. Use ice several times daily also. Always review your discharge instruction sheet given to you by the facility where your surgery was performed.  NO TYLENOL BEFORE 2pm and no IBUPROFEN BEFORE 4pm today.  IF YOU HAVE DISABILITY OR FAMILY LEAVE FORMS, YOU MUST BRING THEM TO THE OFFICE FOR PROCESSING.  DO NOT GIVE THEM TO YOUR DOCTOR.  1. A prescription for pain medication may be given to you upon discharge.  Take your pain medication as prescribed, if needed.  If narcotic pain medicine is not needed, then you may take acetaminophen (Tylenol), naprosyn (Alleve) or ibuprofen (Advil) as needed. 2. Take your usually prescribed medications unless otherwise directed 3. If you need a refill on your pain medication, please contact your pharmacy.  They will contact our office to request authorization.  Prescriptions will not be filled after 5pm or on week-ends. 4. You should eat very light the first 24 hours after surgery, such as soup, crackers, pudding, etc.  Resume your normal diet the day after surgery. 5. Most patients will experience some swelling and bruising  Ice packs and a good support bra will help.   Swelling and bruising can take several days to resolve.  6. It is common to experience some constipation if taking pain medication after surgery.  Increasing fluid intake and taking a stool softener will usually help or prevent this problem from occurring.  A mild laxative (Milk of Magnesia or Miralax) should be taken according to package directions if there are no bowel movements after 48 hours. 7. Unless discharge instructions indicate otherwise, you may remove your bandages 48 hours after surgery and you may shower at that time.  You may have steri-strips (small skin tapes) in place directly over the incision.  These  strips should be left on the skin for 7-10 days and will come off on their own.  If your surgeon used skin glue on the incision, you may shower in 24 hours.  The glue will flake off over the next 2-3 weeks.  Any sutures or staples will be removed at the office during your follow-up visit. 8. ACTIVITIES:  You may resume regular daily activities (gradually increasing) beginning the next day.  Wearing a good support bra or sports bra minimizes pain and swelling.  You may have sexual intercourse when it is comfortable. a. You may drive when you no longer are taking prescription pain medication, you can comfortably wear a seatbelt, and you can safely maneuver your car and apply brakes. b. RETURN TO WORK:  ______________________________________________________________________________________ 9. You should see your doctor in the office for a follow-up appointment approximately two weeks after your surgery.  Your doctor's nurse will typically make your follow-up appointment when she calls you with your pathology report.  Expect your pathology report 3-4 business days after your surgery.  You may call to check if you do not hear from [place] after three days. 10. OTHER INSTRUCTIONS: _______________________________________________________________________________________________ _____________________________________________________________________________________________________________________________________ _____________________________________________________________________________________________________________________________________ _____________________________________________________________________________________________________________________________________  WHEN TO CALL DR WAKEFIELD: 1. Fever over 101.0 2. Nausea and/or vomiting. 3. Extreme swelling or bruising. 4. Continued bleeding from incision. 5. Increased pain, redness, or drainage from the incision.  The clinic staff is available to answer  your questions during regular business hours.  Please don't hesitate to call and ask to speak to one of the nurses  for clinical concerns.  If you have a medical emergency, go to the nearest emergency room or call 911.  A surgeon from [facility] is always on call at the hospital.  For further questions, please visit [email or web address]  Post Anesthesia Home Care Instructions  Activity: Get plenty of rest for the remainder of the day. A responsible individual must stay with you for 24 hours following the procedure.  For the next 24 hours, DO NOT: -Drive a car -Advertising copywriter -Drink alcoholic beverages -Take any medication unless instructed by your physician -Make any legal decisions or sign important papers.  Meals: Start with liquid foods such as gelatin or soup. Progress to regular foods as tolerated. Avoid greasy, spicy, heavy foods. If nausea and/or vomiting occur, drink only clear liquids until the nausea and/or vomiting subsides. Call your physician if vomiting continues.  Special Instructions/Symptoms: Your throat may feel dry or sore from the anesthesia or the breathing tube placed in your throat during surgery. If this causes discomfort, gargle with warm salt water. The discomfort should disappear within 24 hours.  If you had a scopolamine patch placed behind your ear for the management of post- operative nausea and/or vomiting:  1. The medication in the patch is effective for 72 hours, after which it should be removed.  Wrap patch in a tissue and discard in the trash. Wash hands thoroughly with soap and water. 2. You may remove the patch earlier than 72 hours if you experience unpleasant side effects which may include dry mouth, dizziness or visual disturbances. 3. Avoid touching the patch. Wash your hands with soap and water after contact with the patch.

## 2020-04-17 NOTE — Progress Notes (Signed)
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 Patient states that it "helped, but I live at an 8."  Dr Malen Gauze with anesthesia also came by to ask patient how she was doing and let her know she could have a pain pill while in the surgery center if needed.  Pt did not request medication at that time. Pt asked about prescriptions to go home with and RN discussed that Tylenol and Ibuprofen were on her discharge instructions to take at home for postoperative pain.  Patient was encouraged to follow up with her primary care doctor or specialist regarding her chronic nerve pain.  Pt verbalized understanding of the situation.  When charge RN came in to help with patient, pt was very upset and verbalized frustration with her care regarding pain management.  Dr Dwain Sarna was called regarding sending pt home with pain medication prescriptions, but did not prescribe any new medications and had no further orders.

## 2020-04-17 NOTE — Anesthesia Procedure Notes (Signed)
Procedure Name: LMA Insertion Date/Time: 04/17/2020 9:10 AM Performed by: Lauralyn Primes, CRNA Pre-anesthesia Checklist: Patient identified, Emergency Drugs available, Suction available and Patient being monitored Patient Re-evaluated:Patient Re-evaluated prior to induction Oxygen Delivery Method: Circle system utilized Preoxygenation: Pre-oxygenation with 100% oxygen Induction Type: IV induction Ventilation: Mask ventilation without difficulty LMA: LMA inserted LMA Size: 4.0 Number of attempts: 1 Airway Equipment and Method: Bite block Placement Confirmation: positive ETCO2 Tube secured with: Tape Dental Injury: Teeth and Oropharynx as per pre-operative assessment

## 2020-04-17 NOTE — Op Note (Signed)
Preoperative diagnosis: left axillary cyst Postoperative diagnosis: saa Procedure: Excision left axillary mass Surgeon: Dr Harden Mo  Anes: general EBL: minimal Complications none Drains none Specimens left axillary mass to pathology Sponge and needle count correct dispo recovery stable  Indications: 24 yof here with a left axillary mass for about a year that has been increasing in size. no infection, no drainage. is causing discomfort now. she would like to consider excision. On day of surgery this mass is very small. We both identified the very small remaining cyst with what looks like a skin connection. We elected to proceed with excision.. 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   Procedure: After informed consent was obtained the patient was taken to the operating room.  She and I both marked the area we elected to excise.  She was given antibiotics.  SCDs were in place.  She was placed under general anesthesia without complication.  She was prepped and draped in the standard sterile surgical fashion.  Surgical timeout was then performed.  I infiltrated Marcaine and an ellipse around the small mass.  I then made an incision.  I excised the small mass and the surrounding tissue.  There was no other palpable abnormality in the axilla.  This was passed off the table for pathology.  Hemostasis obtained.  I closed the deep axillary tissue with 3-0 Vicryl.  The skin was closed with 3-0 Vicryl for Monocryl.  Glue and Steri-Strips were applied.  She tolerated this well was extubated and transferred to recovery stable.

## 2020-04-17 NOTE — Anesthesia Preprocedure Evaluation (Signed)
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 Has some function of left arm  Neuromuscular disease negative psych ROS   GI/Hepatic negative GI ROS,   Endo/Other  negative endocrine ROS  Renal/GU negative Renal ROS  negative genitourinary   Musculoskeletal Left Axillary mass   Abdominal   Peds  Hematology negative hematology ROS (+)   Anesthesia Other Findings   Reproductive/Obstetrics                             Anesthesia Physical Anesthesia Plan  ASA: II  Anesthesia Plan: General   Post-op Pain Management:    Induction: Intravenous  PONV Risk Score and Plan: 4 or greater and Treatment may vary due to age or medical condition, Scopolamine patch - Pre-op, Ondansetron, Midazolam and Dexamethasone  Airway Management Planned: LMA  Additional Equipment:   Intra-op Plan:   Post-operative Plan: Extubation in OR  Informed Consent: I have reviewed the patients History and Physical, chart, labs and discussed the procedure including the risks, benefits and alternatives for the proposed anesthesia with the patient or authorized representative who has indicated his/her understanding and acceptance.     Dental advisory given  Plan Discussed with: CRNA and Anesthesiologist  Anesthesia Plan Comments:          Anesthesia Quick Evaluation

## 2020-04-17 NOTE — Interval H&P Note (Signed)
History and Physical Interval Note:  04/17/2020 8:49 AM  [name]  has presented today for surgery, with the diagnosis of LEFT AXILLA MASS.  The various methods of treatment have been discussed with the patient and family. After consideration of risks, benefits and other options for treatment, the patient has consented to  Procedure(s): EXCISION OF LEFT AXILLARY MASS (Left) as a surgical intervention.  The patient's history has been reviewed, patient examined, no change in status, stable for surgery.  I have reviewed the patient's chart and labs.  Questions were answered to the patient's satisfaction.     Linda Berry

## 2020-04-18 LAB — SURGICAL PATHOLOGY

## 2020-04-19 ENCOUNTER — Encounter (HOSPITAL_BASED_OUTPATIENT_CLINIC_OR_DEPARTMENT_OTHER): Payer: Self-pay | Admitting: General Surgery

## 2020-05-02 ENCOUNTER — Encounter: Payer: Medicaid Other | Admitting: Family

## 2020-05-02 NOTE — Progress Notes (Signed)
Patient did not show for appointment.   

## 2020-05-17 ENCOUNTER — Telehealth: Payer: Medicaid Other | Admitting: Family

## 2020-05-17 DIAGNOSIS — B373 Candidiasis of vulva and vagina: Secondary | ICD-10-CM

## 2020-05-17 DIAGNOSIS — B3731 Acute candidiasis of vulva and vagina: Secondary | ICD-10-CM

## 2020-05-17 NOTE — Progress Notes (Signed)
Based on what you shared with me, I feel your condition warrants further evaluation and I recommend that you be seen for a face to face office visit.   Given you may be pregnant, you need to be seen face to face to see if you truly have an vaginal infection.    NOTE: If you entered your credit card information for this eVisit, you will not be charged. You may see a "hold" on your card for the $35 but that hold will drop off and you will not have a charge processed.   If you are having a true medical emergency please call 911.      For an urgent face to face visit, [place] has five urgent care centers for your convenience:     [facility] at [facility] Directions [phone] [address] [place], [place] [address] . 10 am - 6pm Monday - Friday    [facility]) Get Driving Directions [phone] [address] [place], [place] [address] . 10 am to 8 pm Monday-Friday . 12 pm to 8 pm [facility]gent Care at [facility] Get Driving Directions [phone] [address], [address] [place], [place] [address] . 8 am to 8 pm Monday-Friday . 9 am to 6 pm Saturday . 11 am to 6 pm Sunday     [facility] at [facility] Get Driving Directions  [phone] [address] [place], [place] [address] . 8 am to 8 pm Monday-Friday . 8 am to 4 pm [facility] Urgent Care at [facility] Directions [phone] [address] [place], [place] [address] . 12 pm to 6 pm Monday-Friday      Your e-visit answers were reviewed by a board certified advanced clinical practitioner to complete your personal care plan.  Thank you for using e-Visits.

## 2020-05-24 ENCOUNTER — Ambulatory Visit (INDEPENDENT_AMBULATORY_CARE_PROVIDER_SITE_OTHER): Payer: Medicaid Other | Admitting: Family Medicine

## 2020-05-24 ENCOUNTER — Encounter: Payer: Self-pay | Admitting: Family Medicine

## 2020-05-24 ENCOUNTER — Other Ambulatory Visit: Payer: Self-pay

## 2020-05-24 VITALS — BP 103/76 | HR 110 | Ht 60.0 in | Wt 138.0 lb

## 2020-05-24 DIAGNOSIS — Z3202 Encounter for pregnancy test, result negative: Secondary | ICD-10-CM

## 2020-05-24 DIAGNOSIS — N911 Secondary amenorrhea: Secondary | ICD-10-CM

## 2020-05-24 LAB — POCT PREGNANCY, URINE: Preg Test, Ur: NEGATIVE

## 2020-05-24 MED ORDER — FLUCONAZOLE 150 MG PO TABS: 150.0000 mg | ORAL_TABLET | Freq: Once | ORAL | 0 refills | Status: AC

## 2020-05-24 NOTE — Progress Notes (Signed)
   Subjective:    Patient ID: Linda Berry, female    DOB: 09-Mar-1996, 25 y.o.   MRN: [id number]  HPI Patient seen for secondary amenorrhea. Was supposed to have her menses on January 6th, approximately 20 days late. Not on birth control. Is sexually active. Always has regular menses. Some vaginal irritation, some nausea.   Review of Systems     Objective:   Physical Exam Vitals and nursing note reviewed.  Constitutional:      Appearance: Normal appearance.  Abdominal:     General: Abdomen is flat. Bowel sounds are normal. There is no distension.     Palpations: Abdomen is soft. There is no mass.     Tenderness: There is no abdominal tenderness.     Hernia: No hernia is present.  Skin:    Capillary Refill: Capillary refill takes less than 2 seconds.  Neurological:     General: No focal deficit present.     Mental Status: She is alert.  Psychiatric:        Mood and Affect: Mood normal.        Behavior: Behavior normal.        Thought Content: Thought content normal.        Judgment: Judgment normal.        Assessment & Plan:  1. Amenorrhea, secondary Check HCG. F/u in 3 months if not resolved. - Beta hCG quant (ref lab)

## 2020-05-25 LAB — BETA HCG QUANT (REF LAB): hCG Quant: <1 m[IU]/mL

## 2020-06-07 ENCOUNTER — Telehealth: Payer: Self-pay | Admitting: Family Medicine

## 2020-06-07 NOTE — Telephone Encounter (Signed)
I called [facility] and left a message I was returning your call and since I did not reach you by phone but can see that you are sending MyChart messages I will send you a message there. Yassine Brunsman,RN

## 2020-06-07 NOTE — Telephone Encounter (Signed)
Pt states that she was told to call the office back if she was experiencing more bleeding or abnormal /frquent bleeding.  Pt states she has a cycle the day after her appt and started again today but dark brown blood clots. Pt request a call back because she is concerned. Needs call today per pt

## 2020-06-09 ENCOUNTER — Other Ambulatory Visit (HOSPITAL_COMMUNITY)
Admission: RE | Admit: 2020-06-09 | Discharge: 2020-06-09 | Disposition: A | Discharge status: Home / Self Care | Payer: Medicaid Other | Source: Ambulatory Visit | Attending: Family Medicine | Admitting: Family Medicine

## 2020-06-09 ENCOUNTER — Encounter: Payer: Self-pay | Admitting: *Deleted

## 2020-06-09 ENCOUNTER — Other Ambulatory Visit: Payer: Self-pay

## 2020-06-09 ENCOUNTER — Ambulatory Visit (INDEPENDENT_AMBULATORY_CARE_PROVIDER_SITE_OTHER): Payer: Medicaid Other | Admitting: *Deleted

## 2020-06-09 VITALS — BP 115/81 | HR 92 | Ht 60.0 in | Wt 141.9 lb

## 2020-06-09 DIAGNOSIS — N898 Other specified noninflammatory disorders of vagina: Secondary | ICD-10-CM | POA: Insufficient documentation

## 2020-06-09 NOTE — Progress Notes (Signed)
Pt presented for appt with c/o vaginal irritation and brownish discharge. She desires testing for STI, yeast and BV.  Self swab was obtained and pt was advised that she will be notified of results and treatment if any is indicated via Mychart. She voiced understanding.

## 2020-06-09 NOTE — Progress Notes (Signed)
Chart reviewed for nurse visit. Agree with plan of care.   Marylene Land, CNM 06/09/2020 12:15 PM

## 2020-06-12 LAB — CERVICOVAGINAL ANCILLARY ONLY
Bacterial Vaginitis (gardnerella): NEGATIVE
Candida Glabrata: NEGATIVE
Candida Vaginitis: NEGATIVE
Chlamydia: NEGATIVE
Comment: NEGATIVE
Comment: NEGATIVE
Comment: NEGATIVE
Comment: NEGATIVE
Comment: NEGATIVE
Comment: NORMAL
Neisseria Gonorrhea: NEGATIVE
Trichomonas: NEGATIVE

## 2020-06-20 ENCOUNTER — Other Ambulatory Visit: Payer: Self-pay | Admitting: Lactation Services

## 2020-06-20 MED ORDER — FLUCONAZOLE 150 MG PO TABS: 150.0000 mg | ORAL_TABLET | Freq: Once | ORAL | 0 refills | Status: AC

## 2020-06-20 NOTE — Progress Notes (Signed)
Ordered Diflucan per standing order for reported white cottage cheese discharge and labial irritation and redness. Advised patient to discuss recurrent vaginal infections with provider at her upcoming visit.

## 2020-06-23 ENCOUNTER — Encounter: Payer: Self-pay | Admitting: Family Medicine

## 2020-06-23 ENCOUNTER — Ambulatory Visit (INDEPENDENT_AMBULATORY_CARE_PROVIDER_SITE_OTHER): Payer: Medicaid Other | Admitting: Family Medicine

## 2020-06-23 ENCOUNTER — Other Ambulatory Visit: Payer: Self-pay

## 2020-06-23 VITALS — BP 128/86 | HR 87 | Wt 141.0 lb

## 2020-06-23 DIAGNOSIS — N939 Abnormal uterine and vaginal bleeding, unspecified: Secondary | ICD-10-CM | POA: Diagnosis not present

## 2020-06-23 DIAGNOSIS — N898 Other specified noninflammatory disorders of vagina: Secondary | ICD-10-CM | POA: Diagnosis not present

## 2020-06-23 DIAGNOSIS — Z3042 Encounter for surveillance of injectable contraceptive: Secondary | ICD-10-CM | POA: Diagnosis not present

## 2020-06-23 LAB — POCT PREGNANCY, URINE: Preg Test, Ur: NEGATIVE

## 2020-06-23 MED ORDER — MEDROXYPROGESTERONE ACETATE 150 MG/ML IM SUSP
150.0000 mg | Freq: Once | INTRAMUSCULAR | Status: AC
  Administered 2020-06-23: 150 mg via INTRAMUSCULAR

## 2020-06-23 NOTE — Progress Notes (Signed)
   Subjective:    Patient ID: Linda Berry, female    DOB: 10-22-1995, 25 y.o.   MRN: [id number]  HPI AUB: Normally has regular menses. Was supposed to get menses on 1/6. Ended up getting it 1/27. Then another period on 2/6. Had bleeding for approximately 5 days each time. Would like to start depo provera.   Recurrent vaginal irritation: changed soaps to one with a strong odor. Started having recurrent irritation. Switched back for about a week and didn't have any irritation. No current irritation   Review of Systems     Objective:   Physical Exam Vitals and nursing note reviewed.  Constitutional:      Appearance: Normal appearance.  Cardiovascular:     Rate and Rhythm: Normal rate and regular rhythm.     Pulses: Normal pulses.     Heart sounds: Normal heart sounds.  Pulmonary:     Effort: Pulmonary effort is normal.  Abdominal:     General: Abdomen is flat. There is no distension.     Palpations: Abdomen is soft. There is no mass.     Tenderness: There is no abdominal tenderness.     Hernia: No hernia is present.  Neurological:     General: No focal deficit present.     Mental Status: She is alert.  Psychiatric:        Mood and Affect: Mood normal.        Behavior: Behavior normal.        Thought Content: Thought content normal.        Judgment: Judgment normal.        Assessment & Plan:  1. Abnormal uterine bleeding (AUB) Would like to start depo. Should control bleeding  2. Vaginal irritation Discussed mild, unscented products, cotton underwear, etc to reduce irritation.

## 2020-06-23 NOTE — Addendum Note (Signed)
Addended by: Marjo Bicker on: 06/23/2020 10:43 AM   Modules accepted: Orders

## 2020-07-04 ENCOUNTER — Telehealth: Payer: Medicaid Other | Admitting: Emergency Medicine

## 2020-07-04 ENCOUNTER — Encounter: Payer: Self-pay | Admitting: Emergency Medicine

## 2020-07-04 DIAGNOSIS — J301 Allergic rhinitis due to pollen: Secondary | ICD-10-CM

## 2020-07-04 MED ORDER — LEVOCETIRIZINE DIHYDROCHLORIDE 5 MG PO TABS: 5.0000 mg | ORAL_TABLET | Freq: Every evening | ORAL | 0 refills | Status: DC

## 2020-07-04 MED ORDER — SALINE SPRAY 0.65 % NA SOLN: 1.0000 | NASAL | 0 refills | Status: DC | PRN

## 2020-07-04 MED ORDER — FLUTICASONE PROPIONATE 50 MCG/ACT NA SUSP: 2.0000 | Freq: Every day | NASAL | 0 refills | Status: DC
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
## 2020-07-04 NOTE — Patient Instructions (Signed)
Allergic Rhinitis   Based on what you have shared with me it looks like you have Allergic Rhinitis.  Rhinitis is when a reaction occurs that causes nasal congestion, runny nose, sneezing, and itching.  Most types of rhinitis are caused by an inflammation and are associated with symptoms in the eyes ears or throat. There are several types of rhinitis.  The most common are acute rhinitis, which is usually caused by a viral illness, allergic or seasonal rhinitis, and nonallergic or year-round rhinitis.  Nasal allergies occur certain times of the year.  Allergic rhinitis is caused when allergens in the air trigger the release of histamine in the body.  Histamine causes itching, swelling, and fluid to build up in the fragile linings of the nasal passages, sinuses and eyelids.  An itchy nose and clear discharge are common.  Use the medication sent to your pharmacy as directed.  HOME CARE:   You can use an over-the-counter saline nasal spray as needed  Avoid areas where there is heavy dust, mites, or molds  Stay indoors on windy days during the pollen season  Keep windows closed in home, at least in bedroom; use air conditioner.  Use high-efficiency house air filter  Keep windows closed in car, turn AC on re-circulate  Avoid playing out with dog during pollen season  GET HELP RIGHT AWAY IF:   If your symptoms do not improve within 10 days  You become short of breath  You develop yellow or green discharge from your nose for over 3 days  You have coughing fits  MAKE SURE YOU:   Understand these instructions  Will watch your condition  Will get help right away if you are not doing well or get worse

## 2020-07-28 MED ORDER — MEDROXYPROGESTERONE ACETATE 10 MG PO TABS: 20.0000 mg | ORAL_TABLET | Freq: Every day | ORAL | 1 refills | Status: DC

## 2020-07-28 NOTE — Telephone Encounter (Signed)
-----   Message from Gerome Apley, RN sent at 07/28/2020 11:01 AM EDT ----- Regarding: bleeding Dr. Adrian Blackwater  She is c/o bleeding x 3 weeks with changing full pad every 1.5.-2 hours- on depo provera- she is requesting medication. What do you advise Bonita Quin

## 2020-08-02 DIAGNOSIS — G5601 Carpal tunnel syndrome, right upper limb: Secondary | ICD-10-CM | POA: Diagnosis not present

## 2020-08-02 DIAGNOSIS — M21932 Unspecified acquired deformity of left forearm: Secondary | ICD-10-CM | POA: Diagnosis not present

## 2020-08-02 DIAGNOSIS — M25532 Pain in left wrist: Secondary | ICD-10-CM | POA: Diagnosis not present

## 2020-08-17 ENCOUNTER — Other Ambulatory Visit: Payer: Self-pay | Admitting: Lactation Services

## 2020-08-17 MED ORDER — FLUCONAZOLE 150 MG PO TABS: 150.0000 mg | ORAL_TABLET | Freq: Once | ORAL | 0 refills | Status: AC

## 2020-08-17 NOTE — Progress Notes (Signed)
Diflucan sent to pharmacy per standing order for thick white cottage cheese like discharge and vaginal itching per patient.

## 2020-08-21 ENCOUNTER — Other Ambulatory Visit (HOSPITAL_COMMUNITY)
Admission: RE | Admit: 2020-08-21 | Discharge: 2020-08-21 | Disposition: A | Discharge status: Home / Self Care | Payer: Medicaid Other | Source: Ambulatory Visit | Attending: Family Medicine | Admitting: Family Medicine

## 2020-08-21 ENCOUNTER — Other Ambulatory Visit: Payer: Self-pay

## 2020-08-21 ENCOUNTER — Ambulatory Visit (INDEPENDENT_AMBULATORY_CARE_PROVIDER_SITE_OTHER): Payer: Medicaid Other | Admitting: *Deleted

## 2020-08-21 ENCOUNTER — Encounter: Payer: Self-pay | Admitting: *Deleted

## 2020-08-21 VITALS — BP 113/63 | HR 73 | Ht 60.0 in | Wt 143.2 lb

## 2020-08-21 DIAGNOSIS — Z9189 Other specified personal risk factors, not elsewhere classified: Secondary | ICD-10-CM | POA: Diagnosis not present

## 2020-08-21 DIAGNOSIS — N898 Other specified noninflammatory disorders of vagina: Secondary | ICD-10-CM | POA: Diagnosis present

## 2020-08-21 DIAGNOSIS — R102 Pelvic and perineal pain: Secondary | ICD-10-CM
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
## 2020-08-22 LAB — CERVICOVAGINAL ANCILLARY ONLY
Bacterial Vaginitis (gardnerella): NEGATIVE
Candida Glabrata: NEGATIVE
Candida Vaginitis: NEGATIVE
Chlamydia: NEGATIVE
Comment: NEGATIVE
Comment: NEGATIVE
Comment: NEGATIVE
Comment: NEGATIVE
Comment: NEGATIVE
Comment: NORMAL
Neisseria Gonorrhea: NEGATIVE
Trichomonas: NEGATIVE

## 2020-08-22 LAB — HIV ANTIBODY (ROUTINE TESTING W REFLEX): HIV Screen 4th Generation wRfx: NONREACTIVE

## 2020-08-22 LAB — RPR: RPR Ser Ql: NONREACTIVE

## 2020-08-29 NOTE — Progress Notes (Signed)
Attestation of Attending Supervision of clinical support staff: I agree with the care provided to this patient and was available for any consultation.  I have reviewed the RN's note and chart. I was available for consult and to see the patient if needed.   Rosea Dory Niles Princetta Uplinger, MD, MPH, ABFM Attending Physician Faculty [facility]- [facility]  

## 2020-08-31 IMAGING — US US MFM UA CORD DOPPLER
1 series · 14 of 27 positions shown · non-contrast
Comparison: none

[Series 1: us mfm ua cord doppler · 27 acquisitions, 14 frames shown]
[im 1/27]
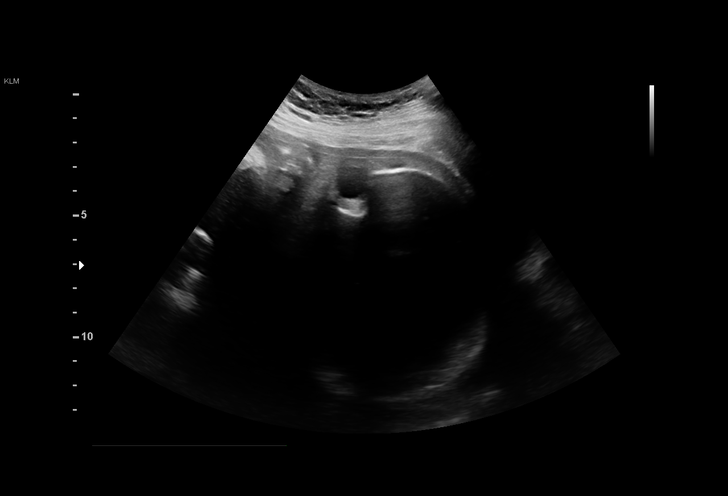
[im 3/27]
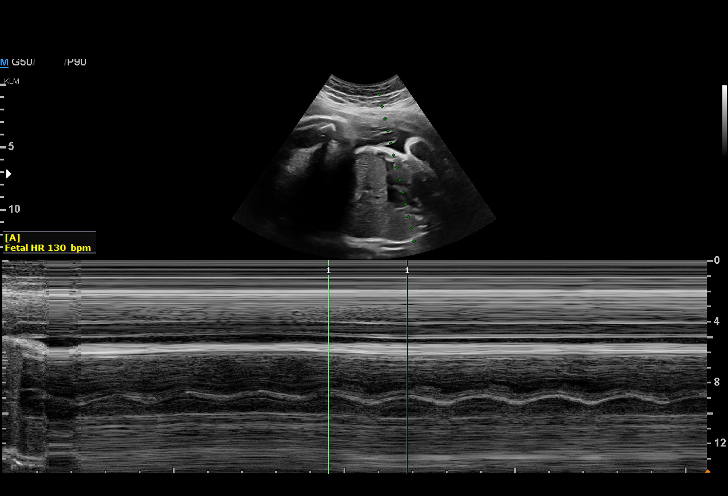
[im 5/27]
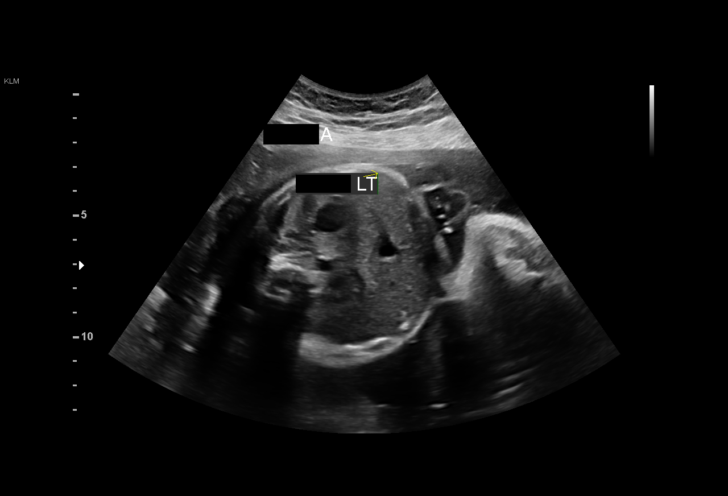
[im 7/27]
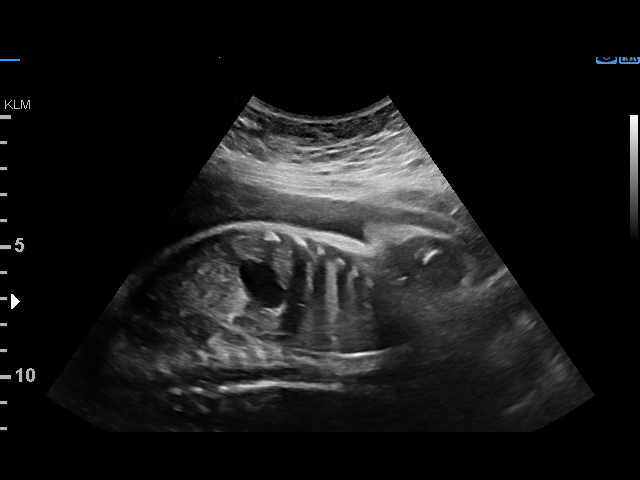
[im 9/27]
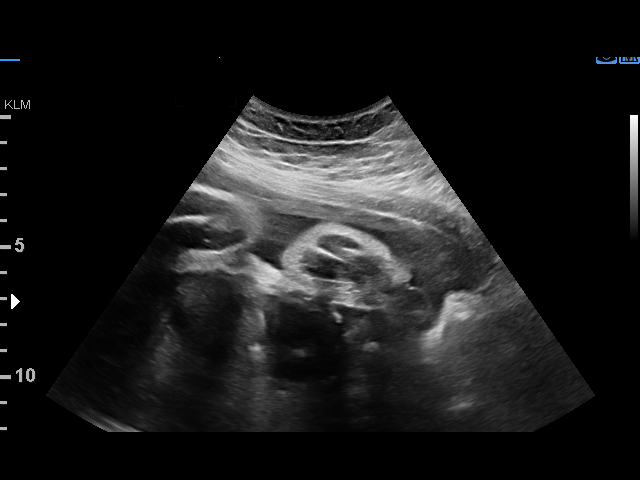
[im 11/27]
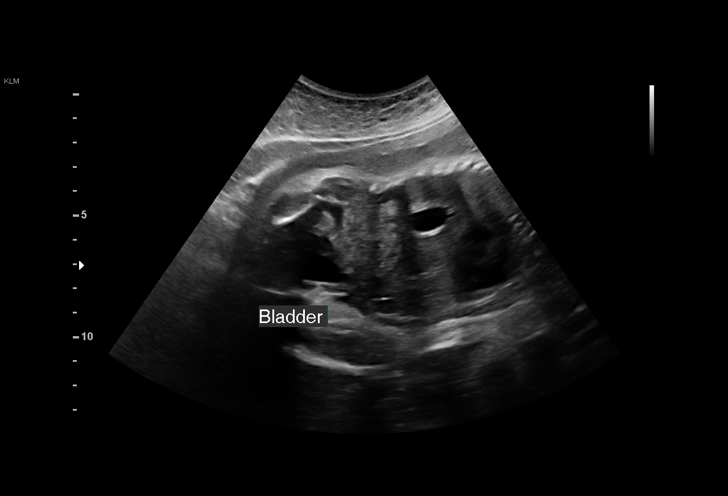
[im 13/27]
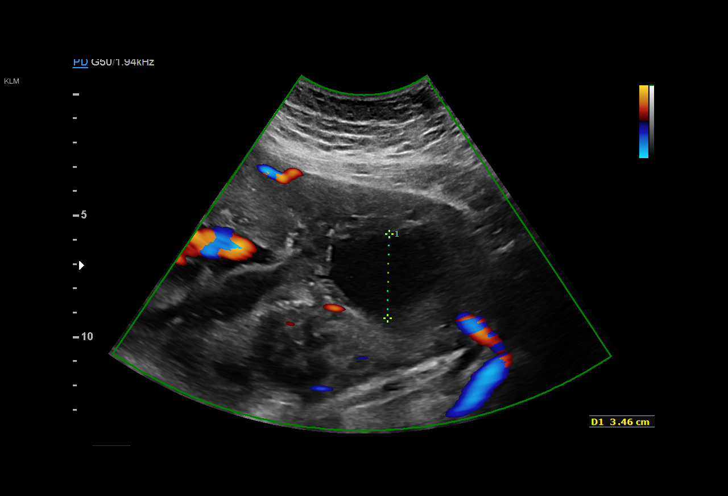
[im 15/27]
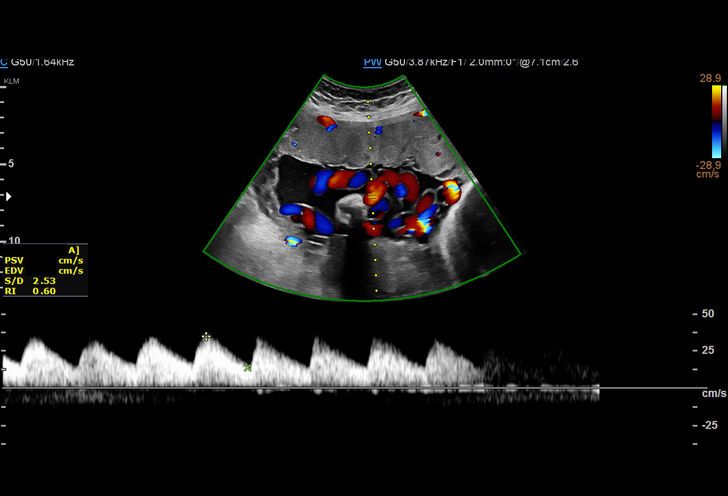
[im 17/27]
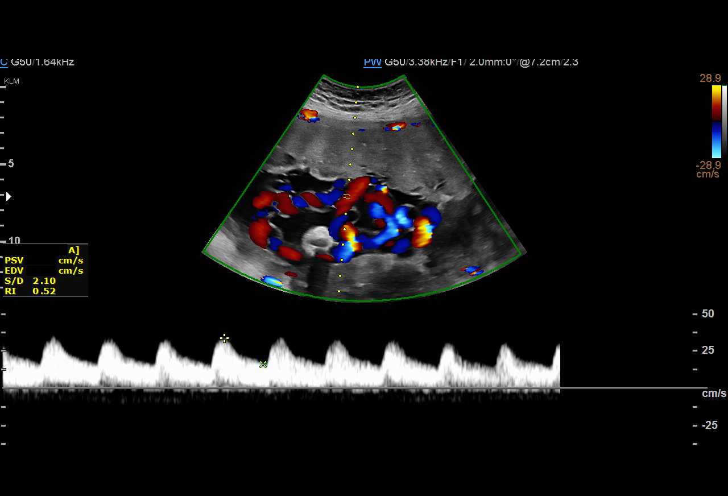
[im 19/27]
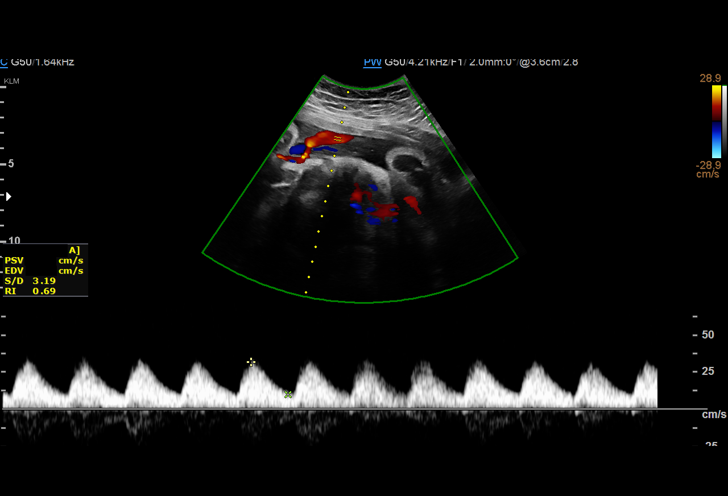
[im 21/27]
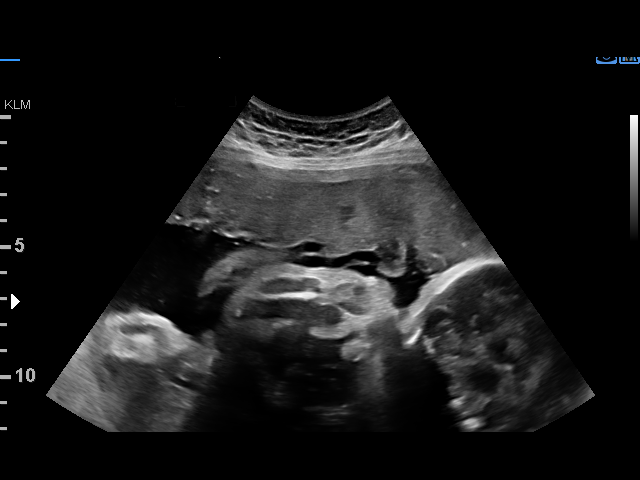
[im 23/27]
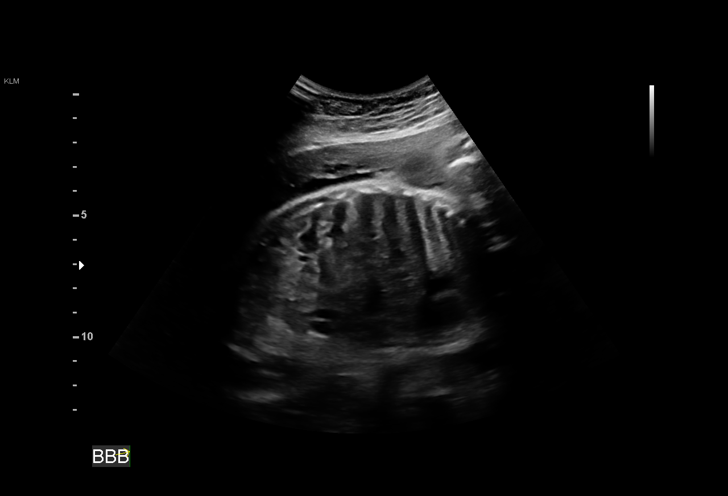
[im 25/27]
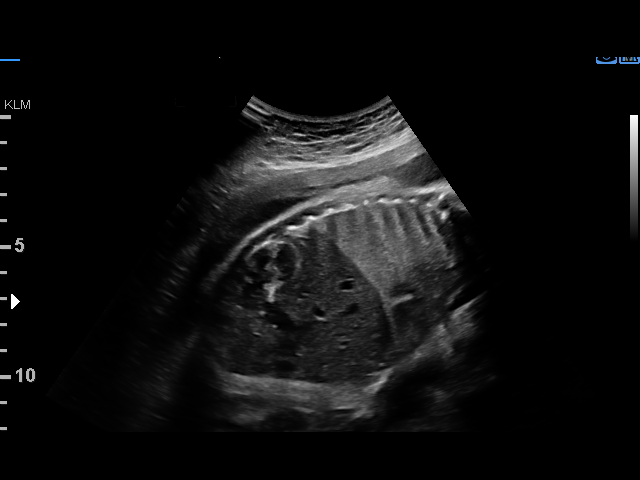
[im 27/27]
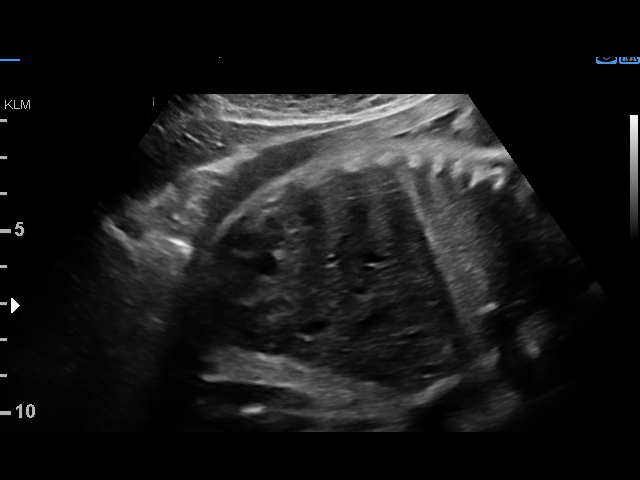

[14 of 27 positions shown; findings below may reference images not displayed]

[address]

     ADDL GESTATION
 ----------------------------------------------------------------------

 ----------------------------------------------------------------------
Indications

  Maternal care for known or suspected poor
  fetal growth, third trimester, not applicable or
  unspecified
  32 weeks gestation of pregnancy
  Twin pregnancy, di/di, third trimester
 ----------------------------------------------------------------------
Vital Signs

                                                Height:        5'0"
Fetal Evaluation (Fetus A)

 Num Of Fetuses:         2
 Fetal Heart Rate(bpm):  130
 Cardiac Activity:       Observed
 Fetal Lie:              Lower Fetus
 Presentation:           Cephalic
 Placenta:               Posterior
 Membrane Desc:      Dividing Membrane seen - Dichorionic.

 Amniotic Fluid
 AFI FV:      Within normal limits

                             Largest Pocket(cm)

Biophysical Evaluation (Fetus A)

 Amniotic F.V:   Within normal limits       F. Tone:        Observed
 F. Movement:    Observed                   Score:          [DATE]
 F. Breathing:   Observed
OB History

 Gravidity:    1         Term:   0        Prem:   0        SAB:   0
 TOP:          0       Ectopic:  0        Living: 0
Gestational Age (Fetus A)

 LMP:           32w 3d        Date:  03/27/18                 EDD:   01/01/19
 Best:          32w 3d     Det. By:  LMP  (03/27/18)          EDD:   01/01/19
Anatomy (Fetus A)

 Stomach:               Appears normal, left   Bladder:                Appears normal
                        sided
Doppler - Fetal Vessels (Fetus A)

 Umbilical Artery
  S/D     %tile
 2.93       67

Fetal Evaluation (Fetus B)

 Num Of Fetuses:         2
 Fetal Heart Rate(bpm):  140
 Cardiac Activity:       Observed
 Fetal Lie:              Upper Fetus
 Presentation:           Cephalic
 Placenta:               Anterior
 Membrane Desc:      Dividing Membrane seen - Dichorionic.

 Amniotic Fluid
 AFI FV:      Within normal limits

                             Largest Pocket(cm)
                             5
Biophysical Evaluation (Fetus B)

 Amniotic F.V:   Within normal limits       F. Tone:        Observed
 F. Movement:    Observed                   Score:          [DATE]
 F. Breathing:   Observed
Gestational Age (Fetus B)

 LMP:           32w 3d        Date:  03/27/18                 EDD:   01/01/19
 Best:          32w 3d     Det. By:  LMP  (03/27/18)          EDD:   01/01/19
Anatomy (Fetus B)

 Stomach:               Appears normal, left   Bladder:                Appears normal
                        sided
Doppler - Fetal Vessels (Fetus B)

 Umbilical Artery
  S/D     %tile                                            ADFV    RDFV
 2.31       28                                                No      No

Impression

 Twin A: Lower fetus, cephalic, posterior placenta. Amniotic
 fluid is normal and good fetal activity is seen. Umbilical artery
 Doppler showed normal forward diastolic flow. Antenatal
 testing is reassuring. BPP [DATE].
 Twin B: Upper fetus, cephalic, anterior placenta. Amniotic
 fluid is normal and good fetal activity is seen. Umbilical artery
 Doppler showed normal forward diastolic flow. Antenatal
 testing is reassuring. BPP [DATE].
 We reassured the patient of the findings.
Recommendations

 -Continue weekly BPP and Doppler till delivery.
 -Fetal growth assessment in 2 weeks.
                 Baute, Deivys

## 2020-09-15 IMAGING — US US MFM FETAL BPP WO NON STRESS
1 series · 14 of 28 positions shown · non-contrast
Comparison: none

[Series 1: us mfm fetal bpp wo non stress · 77 acquisitions, 14 frames shown]
[im 3/77]
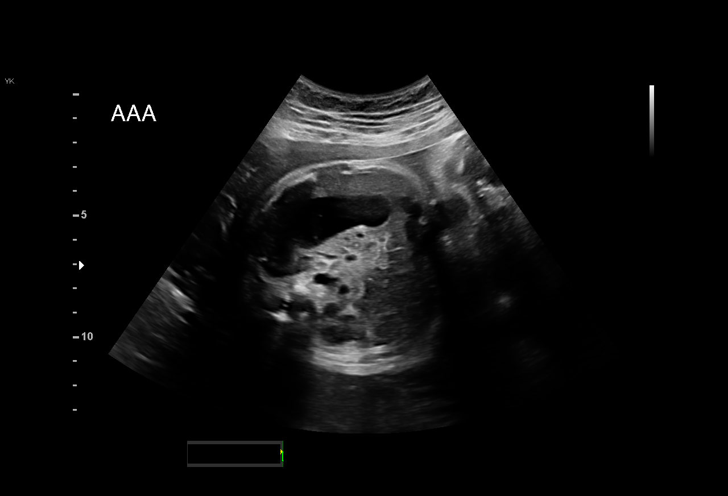
[im 9/77]
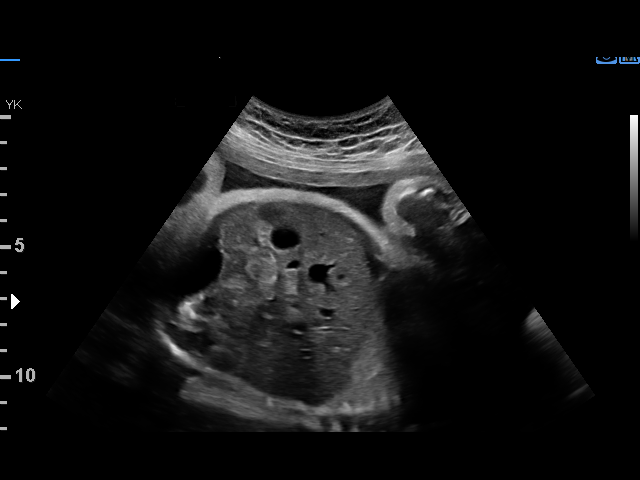
[im 15/77]
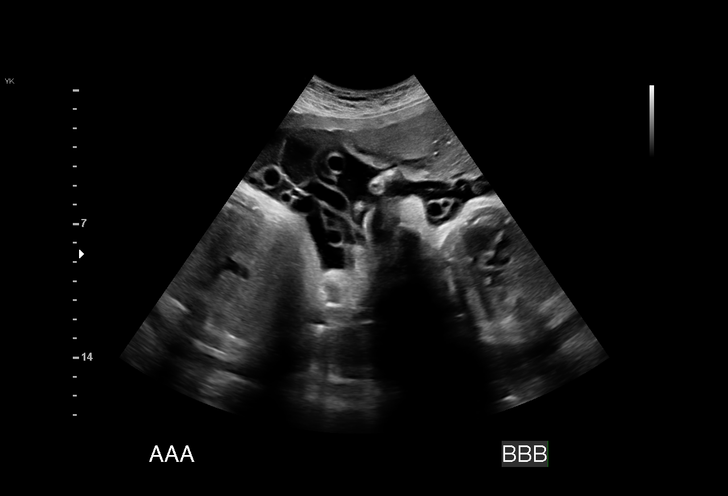
[im 20/77]
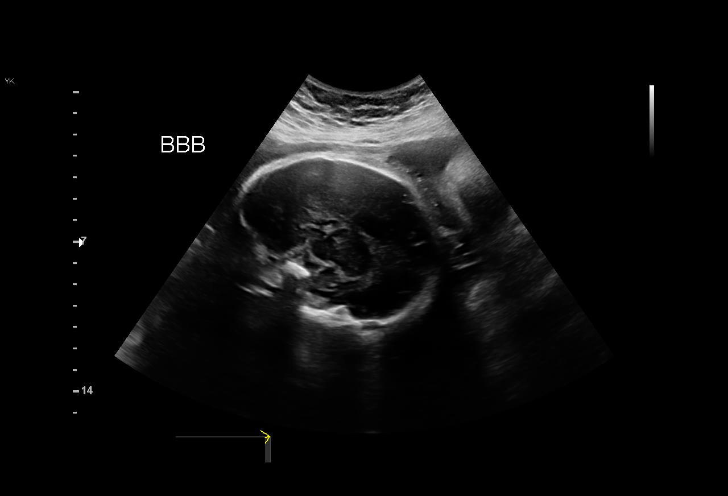
[im 26/77]
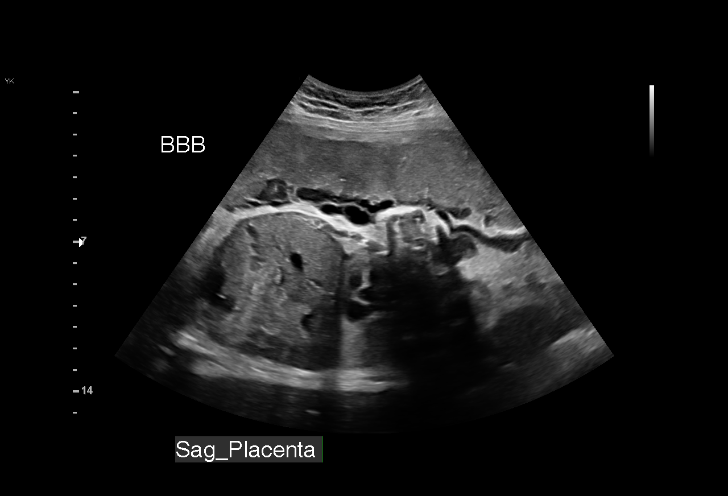
[im 31/77]
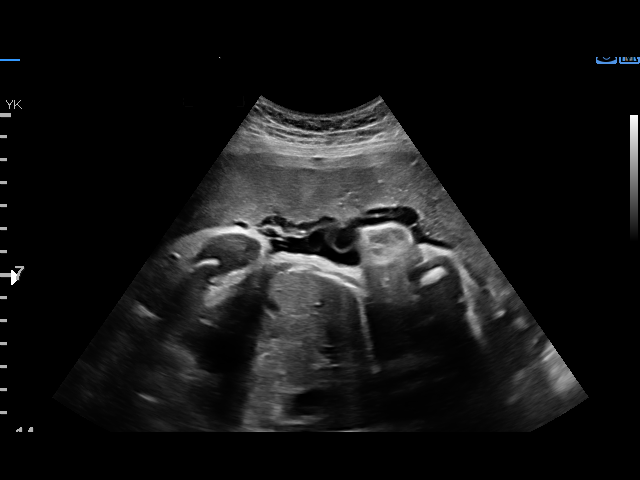
[im 37/77]
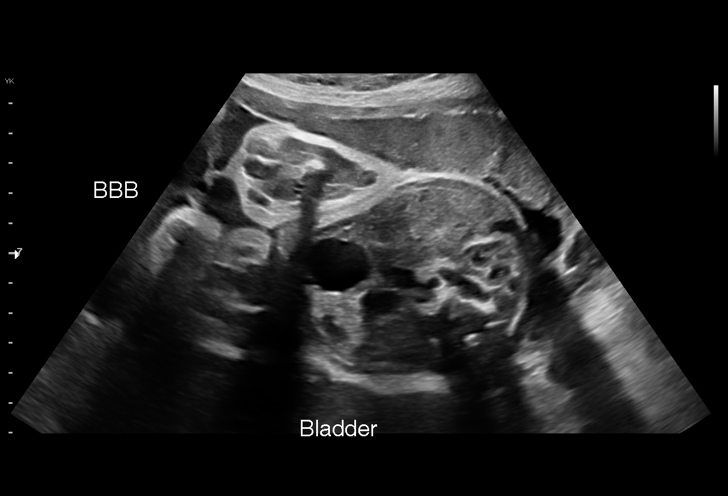
[im 43/77]
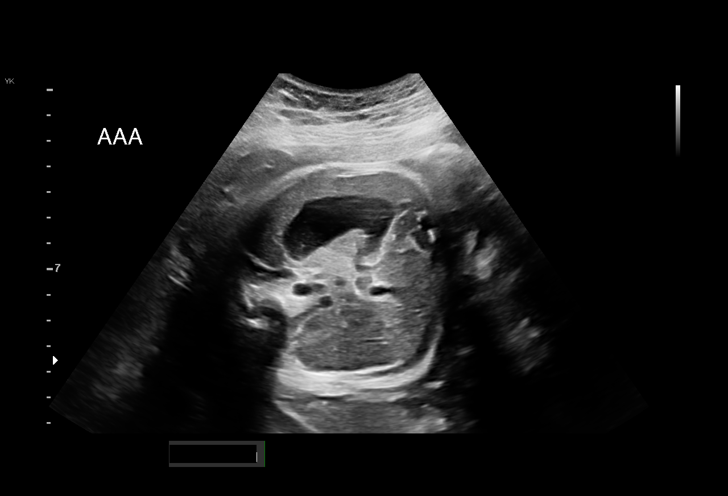
[im 48/77]
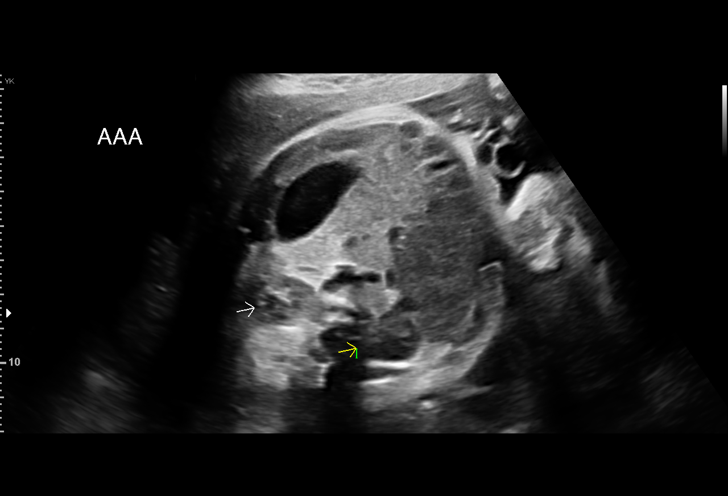
[im 54/77]
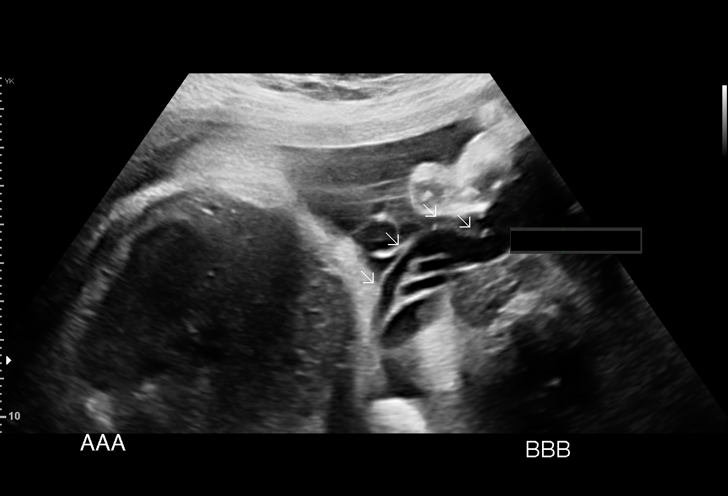
[im 60/77]
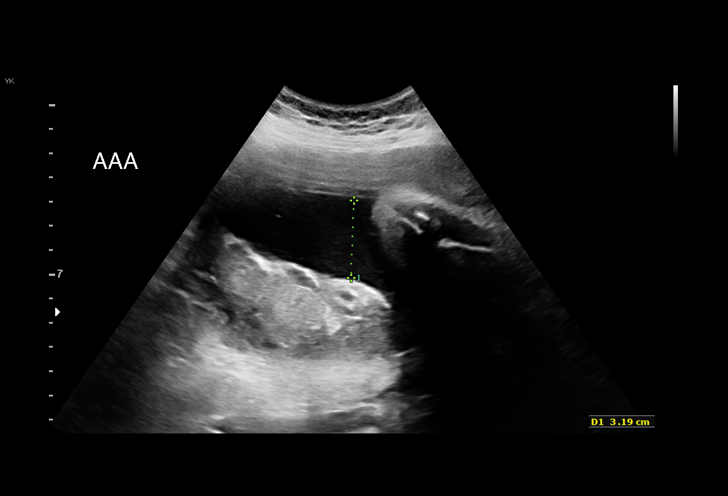
[im 65/77]
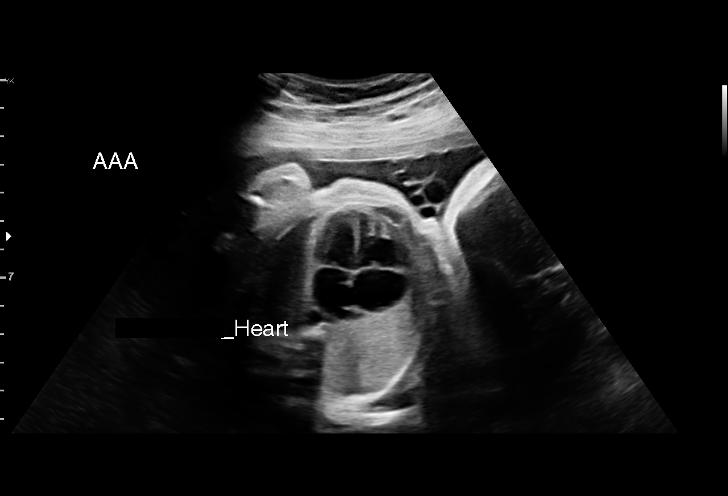
[im 71/77]
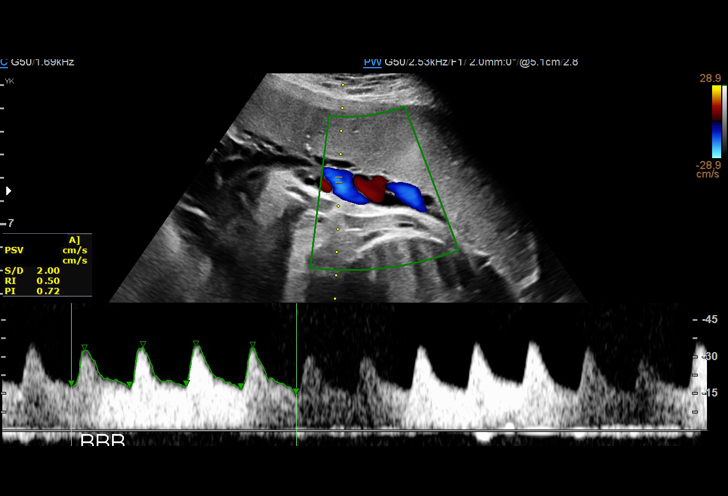
[im 77/77]
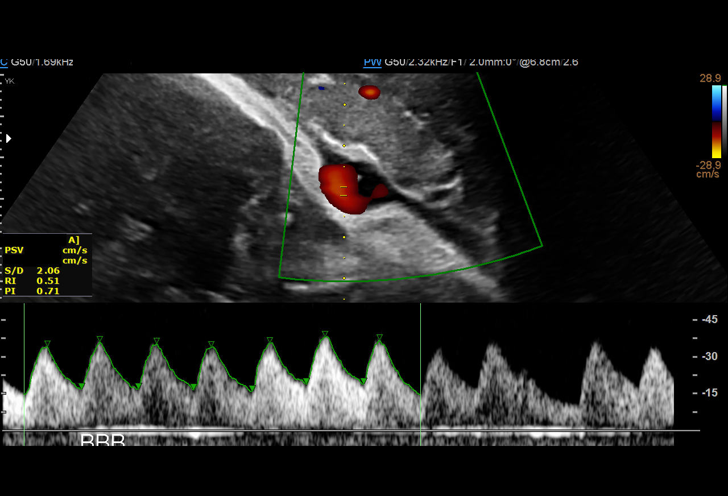

[14 of 28 positions shown; findings below may reference images not displayed]

[address]

     ADDL GESTATION
 ----------------------------------------------------------------------

 ----------------------------------------------------------------------
Indications

  Maternal care for known or suspected poor
  fetal growth, third trimester, not applicable or
  unspecified
  Twin pregnancy, di/di, third trimester
  34 weeks gestation of pregnancy
 ----------------------------------------------------------------------
Vital Signs

                                                Height:        5'0"
Fetal Evaluation (Fetus A)

 Num Of Fetuses:         2
 Fetal Heart Rate(bpm):  154
 Cardiac Activity:       Observed
 Fetal Lie:              Maternal right side
 Presentation:           Cephalic
 Placenta:               Posterior
 P. Cord Insertion:      Previously Visualized
 Membrane Desc:      Dividing Membrane seen

 Amniotic Fluid
 AFI FV:      Upper limit of normal

                             Largest Pocket(cm)

Biophysical Evaluation (Fetus A)

 Amniotic F.V:   Within normal limits       F. Tone:        Observed
 F. Movement:    Observed                   Score:          [DATE]
 F. Breathing:   Observed
OB History

 Gravidity:    1         Term:   0        Prem:   0        SAB:   0
 TOP:          0       Ectopic:  0        Living: 0
Gestational Age (Fetus A)

 LMP:           34w 4d        Date:  03/27/18                 EDD:   01/01/19
 Best:          34w 4d     Det. By:  LMP  (03/27/18)          EDD:   01/01/19
Anatomy (Fetus A)

 Cranium:               Appears normal         Abdominal Wall:         Appears nml (cord
                                                                       insert, abd wall)
 Heart:                 Appears normal         Kidneys:                Appear normal
                        (4CH, axis, and
                        situs)
 Stomach:               Appears normal, left   Bladder:                Appears normal
                        sided
Doppler - Fetal Vessels (Fetus A)

 Umbilical Artery
  S/D     %tile     RI              PI
 2.14       26

Fetal Evaluation (Fetus B)

 Num Of Fetuses:         2
 Fetal Heart Rate(bpm):  145
 Cardiac Activity:       Observed
 Fetal Lie:              Maternal left side
 Presentation:           Cephalic
 Placenta:               Anterior
 P. Cord Insertion:      Visualized, central
 Membrane Desc:      Dividing Membrane seen

 Amniotic Fluid
 AFI FV:      Upper limit of normal

                             Largest Pocket(cm)

Biophysical Evaluation (Fetus B)

 Amniotic F.V:   Within normal limits       F. Tone:        Observed
 F. Movement:    Observed                   Score:          [DATE]
 F. Breathing:   Observed
Gestational Age (Fetus B)
 LMP:           34w 4d        Date:  03/27/18                 EDD:   01/01/19
 Best:          34w 4d     Det. By:  LMP  (03/27/18)          EDD:   01/01/19
Anatomy (Fetus B)

 Cavum:                 Appears normal         Kidneys:                Appear normal
 Stomach:               Appears normal, left   Bladder:                Appears normal
                        sided
Doppler - Fetal Vessels (Fetus B)

 Umbilical Artery
  S/D     %tile     RI              PI
 2.13       25

Impression

 Dichorionic-diamniotic twin pregnancy.
 Fetal growth restriction in both twins.

 Twin A: Lower fetus, maternal right, cephalic, posterior
 placenta. Amniotic fluid is normal and good fetal activity is
 seen. Umbilical artery Doppler showed forward diastolic flow.
 BPP [DATE].

 Twin B: Upper fetus, cephalic, anterior placenta. Amniotic
 fluid is normal and good fetal activity is seen. Umbilical artery
 Doppler showed forward diastolic flow. Fetal tone did not
 meet the criteria of BPP. BPP [DATE].

Recommendations

 -BPP and UA Doppler next week.
 -Delivery at 36 weeks.
                 Locklear, Savio

## 2020-09-20 ENCOUNTER — Ambulatory Visit: Payer: Medicaid Other

## 2020-09-21 ENCOUNTER — Telehealth: Payer: Medicaid Other | Admitting: Physician Assistant

## 2020-09-21 DIAGNOSIS — B9689 Other specified bacterial agents as the cause of diseases classified elsewhere: Secondary | ICD-10-CM

## 2020-09-21 DIAGNOSIS — R059 Cough, unspecified: Secondary | ICD-10-CM

## 2020-09-21 DIAGNOSIS — J019 Acute sinusitis, unspecified: Secondary | ICD-10-CM

## 2020-09-21 MED ORDER — BENZONATATE 100 MG PO CAPS: 100.0000 mg | ORAL_CAPSULE | Freq: Three times a day (TID) | ORAL | 0 refills | Status: DC | PRN

## 2020-09-21 MED ORDER — AMOXICILLIN-POT CLAVULANATE 875-125 MG PO TABS: 1.0000 | ORAL_TABLET | Freq: Two times a day (BID) | ORAL | 0 refills | Status: DC

## 2020-09-21 NOTE — Progress Notes (Signed)
I have spent 5 minutes in review of e-visit questionnaire, review and updating patient chart, medical decision making and response to patient.   Nickol Collister Cody Yuvin Bussiere, PA-C    

## 2020-09-21 NOTE — Progress Notes (Signed)
We are sorry that you are not feeling well.  Here is how we plan to help!  Based on what you have shared with me it looks like you have sinusitis.  Sinusitis is inflammation and infection in the sinus cavities of the head.  Based on your presentation I believe you most likely have Acute Bacterial Sinusitis.  This is an infection caused by bacteria and is treated with antibiotics. I have prescribed Augmentin 875mg /125mg  one tablet twice daily with food, for 7 days. You may use an oral decongestant such as Mucinex D or if you have glaucoma or high blood pressure use plain Mucinex. Saline nasal spray help and can safely be used as often as needed for congestion.  I have also sent in a prescription cough medication to use as directed when needed. If you develop worsening sinus pain, fever or notice severe headache and vision changes, or if symptoms are not better after completion of antibiotic, please schedule an appointment with a health care provider.    Sinus infections are not as easily transmitted as other respiratory infection, however we still recommend that you avoid close contact with loved ones, especially the very young and elderly.  Remember to wash your hands thoroughly throughout the day as this is the number one way to prevent the spread of infection!  Home Care:  Only take medications as instructed by your medical team.  Complete the entire course of an antibiotic.  Do not take these medications with alcohol.  A steam or ultrasonic humidifier can help congestion.  You can place a towel over your head and breathe in the steam from hot water coming from a faucet.  Avoid close contacts especially the very young and the elderly.  Cover your mouth when you cough or sneeze.  Always remember to wash your hands.  Get Help Right Away If:  You develop worsening fever or sinus pain.  You develop a severe head ache or visual changes.  Your symptoms persist after you have completed your  treatment plan.  Make sure you  Understand these instructions.  Will watch your condition.  Will get help right away if you are not doing well or get worse.  Your e-visit answers were reviewed by a board certified advanced clinical practitioner to complete your personal care plan.  Depending on the condition, your plan could have included both over the counter or prescription medications.  If there is a problem please reply  once you have received a response from your provider.  Your safety is important to .  If you have drug allergies check your prescription carefully.    You can use MyChart to ask questions about today's visit, request a non-urgent call back, or ask for a work or school excuse for 24 hours related to this e-Visit. If it has been greater than 24 hours you will need to follow up with your provider, or enter a new e-Visit to address those concerns.  You will get an e-mail in the next two days asking about your experience.  I hope that your e-visit has been valuable and will speed your recovery. Thank you for using e-visits.

## 2020-09-22 IMAGING — US US MFM FETAL BPP WO NON STRESS
1 series · 14 of 28 positions shown · non-contrast
Comparison: none

[Series 1: us mfm fetal bpp wo non stress · 43 acquisitions, 14 frames shown]
[im 2/43]
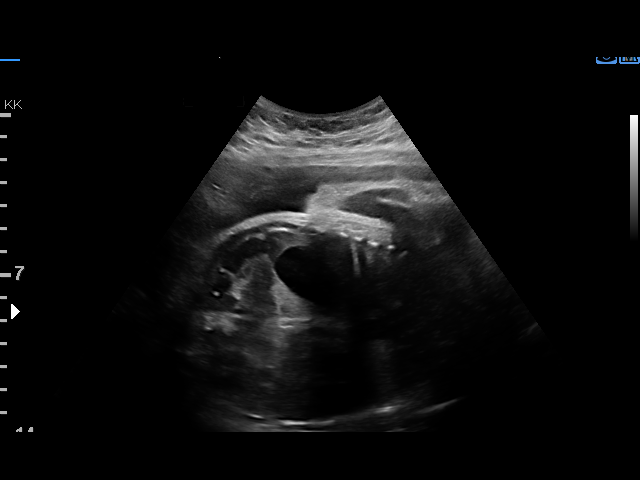
[im 5/43]
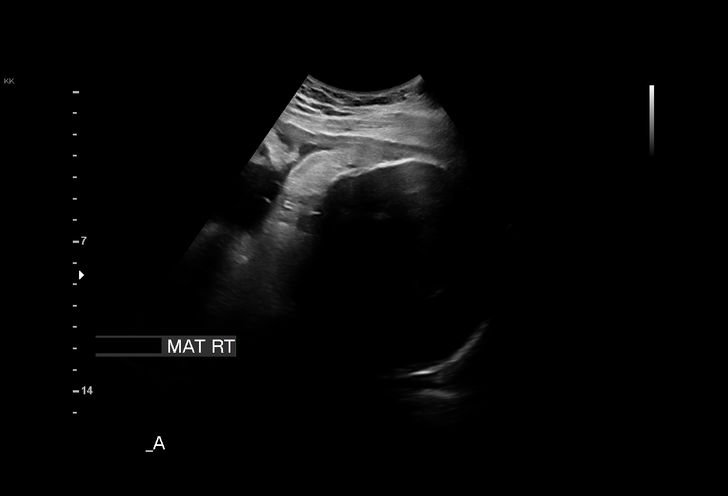
[im 8/43]
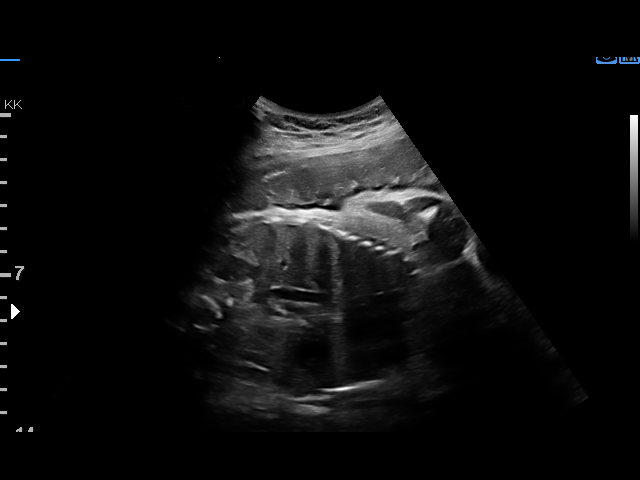
[im 11/43]
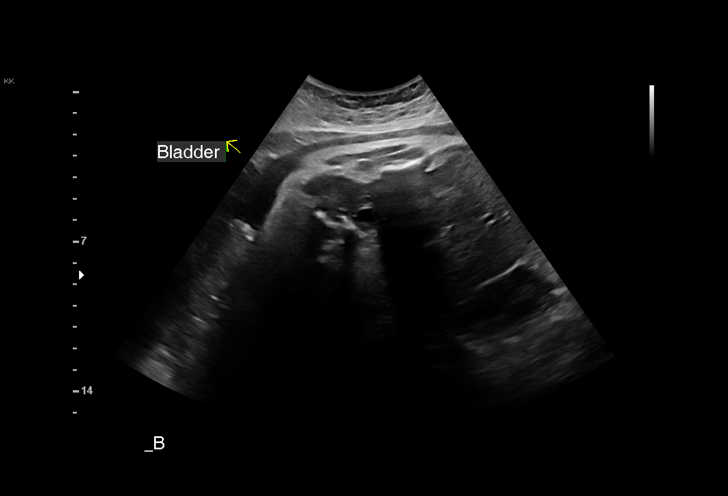
[im 15/43]
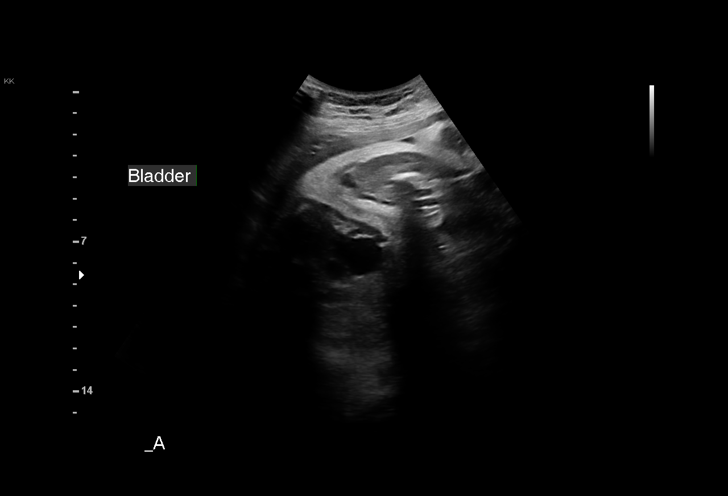
[im 18/43]
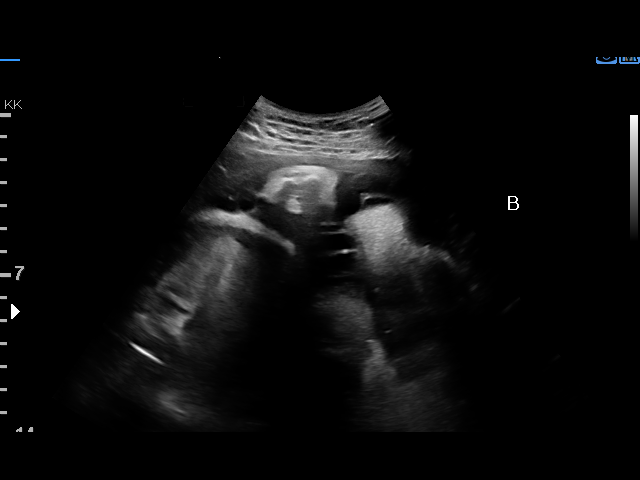
[im 21/43]
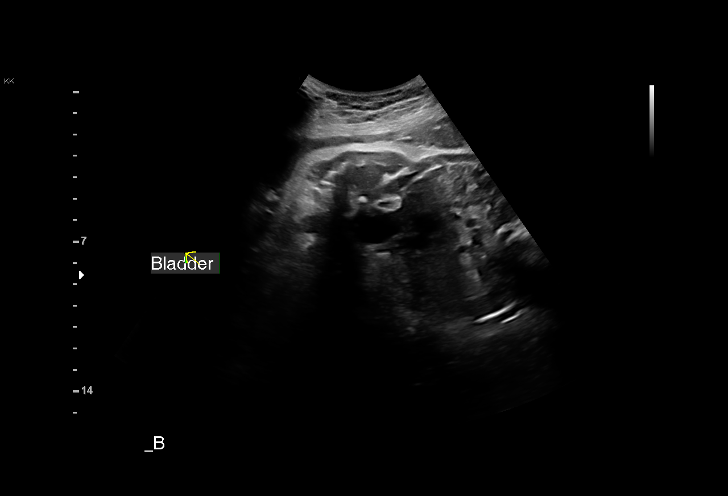
[im 24/43]
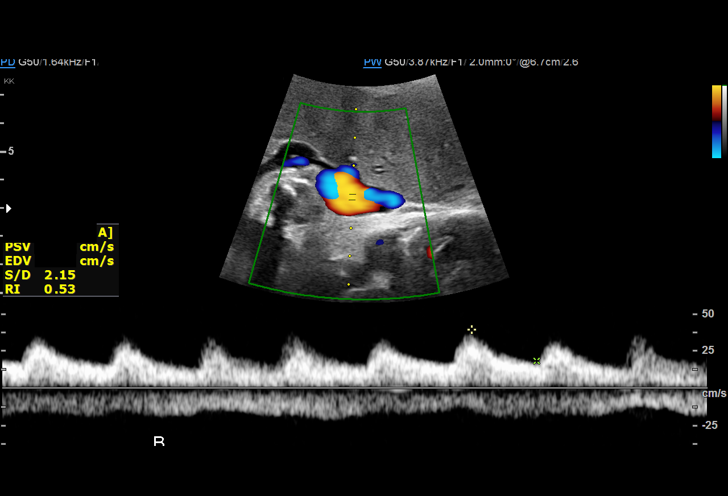
[im 27/43]
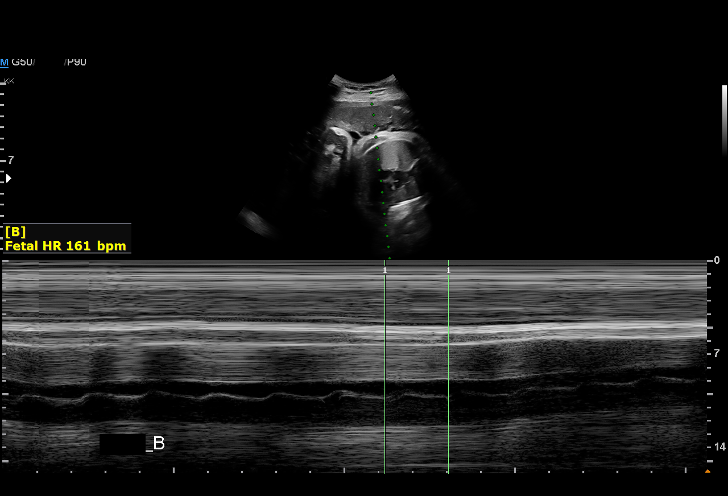
[im 30/43]
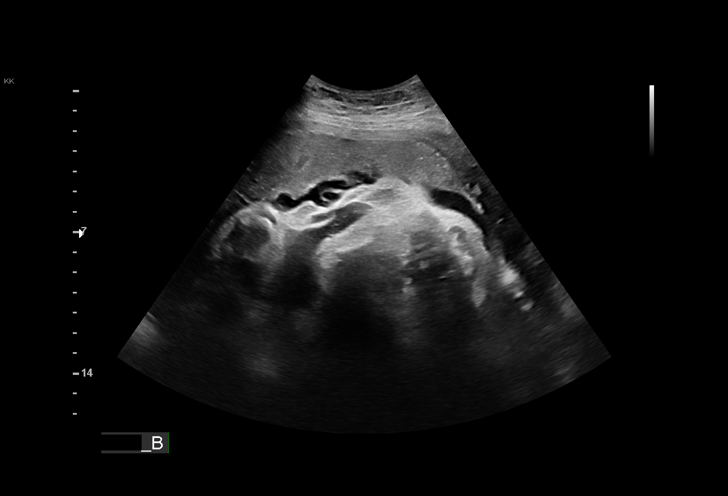
[im 33/43]
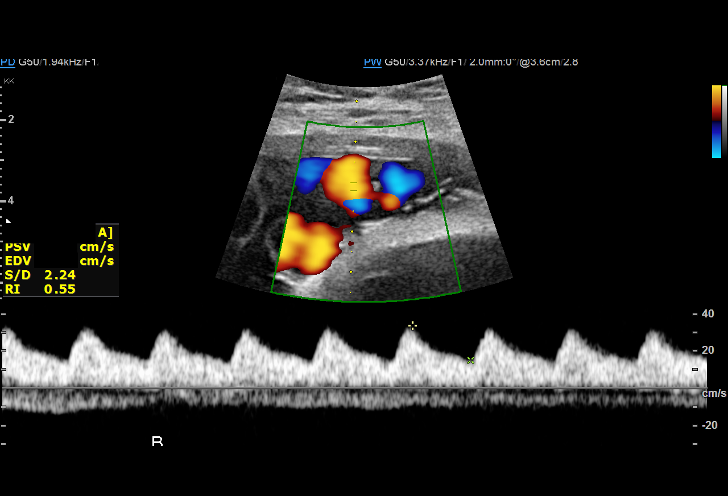
[im 36/43]
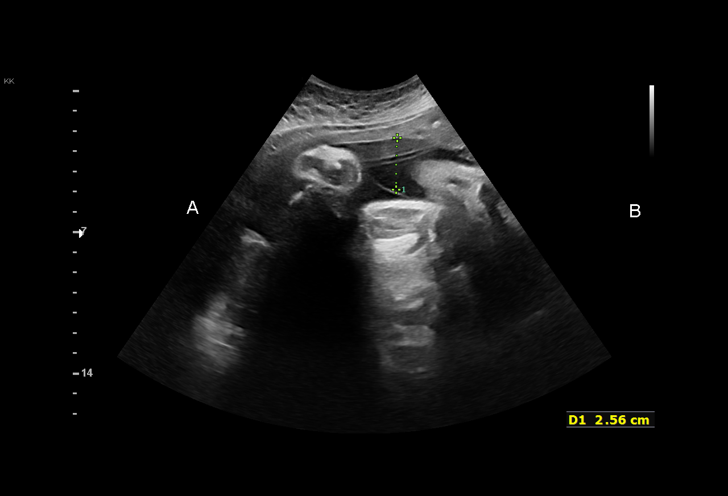
[im 39/43]
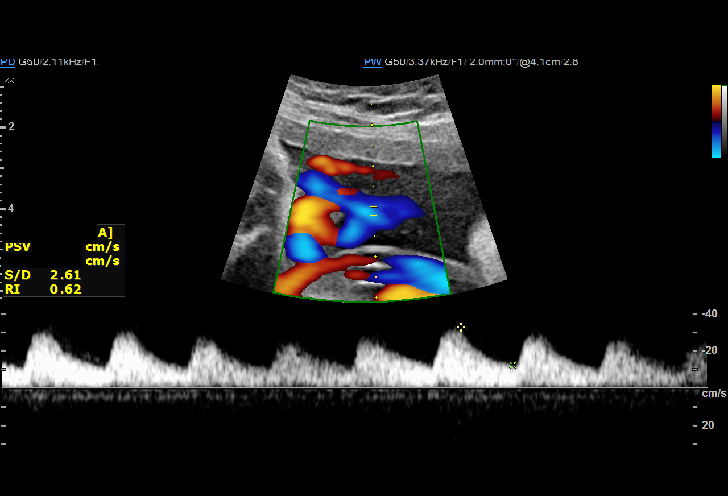
[im 43/43]
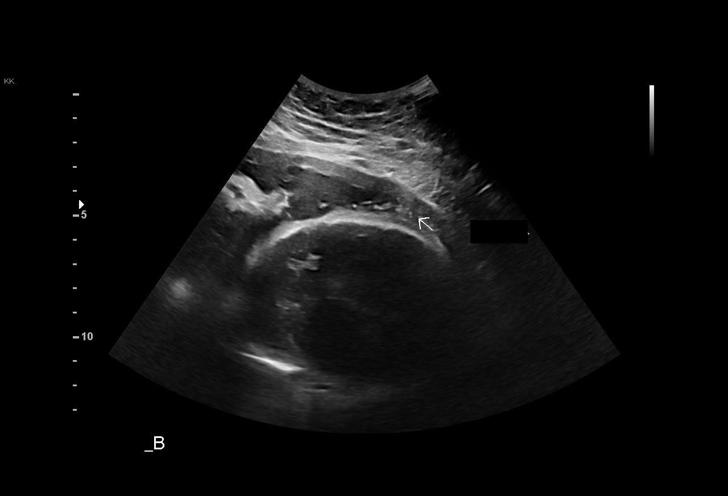

[14 of 28 positions shown; findings below may reference images not displayed]

[address]

     ADDL GESTATION
 ----------------------------------------------------------------------

 ----------------------------------------------------------------------
Indications

  35 weeks gestation of pregnancy
  Maternal care for known or suspected poor
  fetal growth, third trimester, not applicable or
  unspecified
  Twin pregnancy, di/di, third trimester
 ----------------------------------------------------------------------
Vital Signs

 BMI:
Fetal Evaluation (Fetus A)

 Num Of Fetuses:         2
 Fetal Heart Rate(bpm):  155
 Cardiac Activity:       Observed
 Fetal Lie:              Lower Right Fetus
 Presentation:           Cephalic

 Amniotic Fluid
 AFI FV:      Within normal limits

                             Largest Pocket(cm)

Biophysical Evaluation (Fetus A)

 Amniotic F.V:   Pocket => 2 cm two         F. Tone:        Observed
                 planes
 F. Movement:    Observed                   Score:          [DATE]
 F. Breathing:   Observed
OB History

 Gravidity:    1         Term:   0        Prem:   0        SAB:   0
 TOP:          0       Ectopic:  0        Living: 0
Gestational Age (Fetus A)

 LMP:           35w 4d        Date:  03/27/18                 EDD:   01/01/19
 Best:          35w 4d     Det. By:  LMP  (03/27/18)          EDD:   01/01/19
Doppler - Fetal Vessels (Fetus A)

 Umbilical Artery
  S/D     %tile                                            ADFV    RDFV
  2.6       61                                                No      No

Fetal Evaluation (Fetus B)

 Num Of Fetuses:         2
 Fetal Heart Rate(bpm):  161
 Cardiac Activity:       Observed
 Fetal Lie:              Upper Left Fetus
 Presentation:           Cephalic

 Amniotic Fluid
 AFI FV:      Within normal limits

                             Largest Pocket(cm)

Biophysical Evaluation (Fetus B)

 Amniotic F.V:   Pocket => 2 cm two         F. Tone:        Observed
                 planes
 F. Movement:    Observed                   Score:          [DATE]
 F. Breathing:   Observed
Gestational Age (Fetus B)

 LMP:           35w 4d        Date:  03/27/18                 EDD:   01/01/19
 Best:          35w 4d     Det. By:  LMP  (03/27/18)          EDD:   01/01/19
Doppler - Fetal Vessels (Fetus B)

 Umbilical Artery
  S/D     %tile     RI              PI                     ADFV    RDFV
 2.34       44   0.57             0.91                        No      No
Impression

 Dichorionic-diamniotic twin pregnancy.
 Twin B has fetal growth restriction.
 Twin A: Lower fetus, cephalic presentation. Amniotic fluid is
 normal and good fetal activity is seen. Antenatal testing is
 reassuring. BPP [DATE]. Umbilical artery Doppler showed normal
 forward diastolic flow.
 Twin B: Upper fetus, cephalic presentation. Amniotic fluid is
 normal and good fetal activity is seen. Antenatal testing is
 reassuring. BPP [DATE]. Umbilical artery Doppler showed normal
 forward diastolic flow.
 Her blood pressures at our office: 150/91, 141/84 mm Hg.
 Repeat blood pressures were 146/87 and 140/85 mm Hg.
 She has gestational hypertension.
 preeclampsia. I recommended evaluation at the EM, but the
 patient does not want to go to the EM. I emphasized the
 importance of checking her blood pressures at least once
 daily (she has BP instrument at home). I also educated her
 on the symptoms.
 I discussed timing of delivery. Twin pregnancy complicated
 by growth restriction and gestational hypertension should be
 delivered by 36 weeks to prevent adverse pregnancy
 outcomes.
                 Aujla, Caleb

## 2020-10-04 ENCOUNTER — Other Ambulatory Visit (HOSPITAL_COMMUNITY)
Admission: RE | Admit: 2020-10-04 | Discharge: 2020-10-04 | Disposition: A | Discharge status: Home / Self Care | Payer: Medicaid Other | Source: Ambulatory Visit | Attending: Family Medicine | Admitting: Family Medicine

## 2020-10-04 ENCOUNTER — Other Ambulatory Visit: Payer: Self-pay

## 2020-10-04 ENCOUNTER — Other Ambulatory Visit: Payer: Self-pay | Admitting: Certified Nurse Midwife

## 2020-10-04 ENCOUNTER — Ambulatory Visit (INDEPENDENT_AMBULATORY_CARE_PROVIDER_SITE_OTHER): Payer: Medicaid Other

## 2020-10-04 VITALS — BP 124/79 | HR 76 | Wt 139.7 lb

## 2020-10-04 DIAGNOSIS — Z113 Encounter for screening for infections with a predominantly sexual mode of transmission: Secondary | ICD-10-CM | POA: Diagnosis not present

## 2020-10-04 DIAGNOSIS — T7421XD Adult sexual abuse, confirmed, subsequent encounter: Secondary | ICD-10-CM

## 2020-10-04 NOTE — Progress Notes (Signed)
Patient was assessed and managed by nursing staff during this encounter. I have reviewed the chart and agree with the documentation and plan.  Bernerd Limbo, CNM 10/04/2020 6:42 PM
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
## 2020-10-05 LAB — CERVICOVAGINAL ANCILLARY ONLY
Chlamydia: NEGATIVE
Comment: NEGATIVE
Comment: NEGATIVE
Comment: NORMAL
Neisseria Gonorrhea: NEGATIVE
Trichomonas: NEGATIVE

## 2020-10-05 LAB — RPR: RPR Ser Ql: NONREACTIVE

## 2020-10-05 LAB — HEPATITIS B SURFACE ANTIGEN: Hepatitis B Surface Ag: NEGATIVE

## 2020-10-05 LAB — HIV ANTIBODY (ROUTINE TESTING W REFLEX): HIV Screen 4th Generation wRfx: NONREACTIVE

## 2020-10-18 DIAGNOSIS — G5601 Carpal tunnel syndrome, right upper limb: Secondary | ICD-10-CM | POA: Diagnosis not present

## 2020-10-26 ENCOUNTER — Telehealth: Payer: Medicaid Other | Admitting: Physician Assistant

## 2020-10-26 DIAGNOSIS — J019 Acute sinusitis, unspecified: Secondary | ICD-10-CM | POA: Diagnosis not present

## 2020-10-26 DIAGNOSIS — B9789 Other viral agents as the cause of diseases classified elsewhere: Secondary | ICD-10-CM | POA: Diagnosis not present

## 2020-10-26 MED ORDER — FLUTICASONE PROPIONATE 50 MCG/ACT NA SUSP: 2.0000 | Freq: Every day | NASAL | 0 refills | Status: DC

## 2020-10-26 MED ORDER — BENZONATATE 100 MG PO CAPS: 100.0000 mg | ORAL_CAPSULE | Freq: Three times a day (TID) | ORAL | 0 refills | Status: DC | PRN

## 2020-10-26 NOTE — Progress Notes (Signed)
I have spent 5 minutes in review of e-visit questionnaire, review and updating patient chart, medical decision making and response to patient.   Linda Whisenant [name], PA-C    

## 2020-10-26 NOTE — Progress Notes (Signed)
E-Visit for Sinus Problems  We are sorry that you are not feeling well.  Here is how we plan to help!  Based on what you have shared with me it looks like you have sinusitis.  Sinusitis is inflammation and infection in the sinus cavities of the head.  Based on your presentation I believe you most likely have Acute Viral Sinusitis.This is an infection most likely caused by a virus. There is not specific treatment for viral sinusitis other than to help you with the symptoms until the infection runs its course.  You may use an oral decongestant such as Mucinex D or if you have glaucoma or high blood pressure use plain Mucinex. Saline nasal spray help and can safely be used as often as needed for congestion, I have prescribed: Fluticasone nasal spray two sprays in each nostril once a day. I have also prescribed Tessalon for your cough.   Some authorities believe that zinc sprays or the use of Echinacea may shorten the course of your symptoms.  Sinus infections are not as easily transmitted as other respiratory infection, however we still recommend that you avoid close contact with loved ones, especially the very young and elderly.  Remember to wash your hands thoroughly throughout the day as this is the number one way to prevent the spread of infection!  Home Care: Only take medications as instructed by your medical team. Do not take these medications with alcohol. A steam or ultrasonic humidifier can help congestion.  You can place a towel over your head and breathe in the steam from hot water coming from a faucet. Avoid close contacts especially the very young and the elderly. Cover your mouth when you cough or sneeze. Always remember to wash your hands.  Get Help Right Away If: You develop worsening fever or sinus pain. You develop a severe head ache or visual changes. Your symptoms persist after you have completed your treatment plan.  Make sure you Understand these instructions. Will watch  your condition. Will get help right away if you are not doing well or get worse.   Thank you for choosing an e-visit.  Your e-visit answers were reviewed by a board certified advanced clinical practitioner to complete your personal care plan. Depending upon the condition, your plan could have included both over the counter or prescription medications.  Please review your pharmacy choice. Make sure the pharmacy is open so you can pick up prescription now. If there is a problem, you may contact your provider through [employer] and have the prescription routed to another pharmacy.  Your safety is important to [place]. If you have drug allergies check your prescription carefully.   For the next 24 hours you can use MyChart to ask questions about today's visit, request a non-urgent call back, or ask for a work or school excuse. You will get an email in the next two days asking about your experience. I hope that your e-visit has been valuable and will speed your recovery.

## 2020-11-22 ENCOUNTER — Ambulatory Visit: Payer: Medicaid Other

## 2020-12-26 ENCOUNTER — Telehealth: Payer: Self-pay | Admitting: *Deleted

## 2020-12-26 NOTE — Telephone Encounter (Signed)
Pt left voicemail message stating that she received a text message from [facility] which stated that her partner is being treated for STD. He allowed them to reach out to her regarding this and she thought that [facility] would be contacting us for Rx to treat her. She requests a call back.

## 2020-12-27 NOTE — Telephone Encounter (Signed)
Called pt and pt informed me that her boyfriend was prescribed medication for an STD.  I asked pt what he was being treated as we would be able to send in an Rx for her as well.  Pt stated that she was unsure of what, she would reach out to her boyfriend, and send [place] a MyChart message.  I informed pt that I would look out for her MyChart message.    Leonette Nutting  12/27/20

## 2020-12-28 ENCOUNTER — Other Ambulatory Visit: Payer: Self-pay

## 2020-12-28 MED ORDER — DOXYCYCLINE HYCLATE 100 MG PO TABS: 100.0000 mg | ORAL_TABLET | Freq: Two times a day (BID) | ORAL | 0 refills | Status: DC

## 2021-01-03 ENCOUNTER — Ambulatory Visit (INDEPENDENT_AMBULATORY_CARE_PROVIDER_SITE_OTHER): Payer: Medicaid Other

## 2021-01-03 ENCOUNTER — Other Ambulatory Visit: Payer: Self-pay

## 2021-01-03 VITALS — BP 107/71 | HR 60 | Wt 133.2 lb

## 2021-01-03 DIAGNOSIS — Z202 Contact with and (suspected) exposure to infections with a predominantly sexual mode of transmission: Secondary | ICD-10-CM | POA: Diagnosis not present

## 2021-01-03 MED ORDER — AZITHROMYCIN 500 MG PO TABS
1000.0000 mg | ORAL_TABLET | Freq: Once | ORAL | Status: AC
  Administered 2021-01-03: 1000 mg via ORAL

## 2021-01-03 MED ORDER — CEFTRIAXONE SODIUM 500 MG IJ SOLR
500.0000 mg | Freq: Once | INTRAMUSCULAR | Status: AC
  Administered 2021-01-03: 500 mg via INTRAMUSCULAR

## 2021-01-03 NOTE — Progress Notes (Signed)
Pt here today for STD treatment.  Pt reports that partner tested positive for gonorrhea/chlamydia and wanted to be treated.  Pt was prescribed Doxycycline per protocol on 12/28/20.  Pt reports that she is unable to take the Doxycycline as it makes her nauseous, have headaches, and it just does not sit well in her stomach.  Per Crissie Reese, MD pt can have Zithromax 1000 mg once here in the office.  Pt given Zithromax 1000 mg and Rocephin 500 mg per standing protocol.  Pt tolerated well.  Pt advised to abstain from intercourse at least two weeks from you both being treated.  Pt verbalized understanding.   Addison Naegeli, RN  01/03/21

## 2021-01-03 NOTE — Progress Notes (Signed)
Chart reviewed for nurse visit. Agree with plan of care.   Venora Maples, MD 01/03/21 2:24 PM

## 2021-01-22 ENCOUNTER — Telehealth: Payer: Medicaid Other | Admitting: Physician Assistant

## 2021-01-22 DIAGNOSIS — J4520 Mild intermittent asthma, uncomplicated: Secondary | ICD-10-CM | POA: Diagnosis not present

## 2021-01-22 DIAGNOSIS — Z20822 Contact with and (suspected) exposure to covid-19: Secondary | ICD-10-CM | POA: Diagnosis not present

## 2021-01-22 MED ORDER — PSEUDOEPH-BROMPHEN-DM 30-2-10 MG/5ML PO SYRP: 5.0000 mL | ORAL_SOLUTION | Freq: Four times a day (QID) | ORAL | 0 refills | Status: DC | PRN

## 2021-01-22 MED ORDER — ALBUTEROL SULFATE HFA 108 (90 BASE) MCG/ACT IN AERS: 2.0000 | INHALATION_SPRAY | Freq: Four times a day (QID) | RESPIRATORY_TRACT | 0 refills | Status: AC | PRN

## 2021-01-22 MED ORDER — IPRATROPIUM BROMIDE 0.03 % NA SOLN: 2.0000 | Freq: Two times a day (BID) | NASAL | 0 refills | Status: DC

## 2021-01-22 MED ORDER — BENZONATATE 100 MG PO CAPS
100.0000 mg | ORAL_CAPSULE | Freq: Three times a day (TID) | ORAL | 0 refills | Status: DC | PRN
Start: 2021-01-22 — End: 2021-06-01

## 2021-01-22 NOTE — Progress Notes (Signed)
Virtual Visit Consent   Linda Berry, you are scheduled for a virtual visit with a [facility] provider today.     Just as with appointments in the office, your consent must be obtained to participate.  Your consent will be active for this visit and any virtual visit you may have with one of our providers in the next 365 days.     If you have a MyChart account, a copy of this consent can be sent to you electronically.  All virtual visits are billed to your insurance company just like a traditional visit in the office.    As this is a virtual visit, video technology does not allow for your provider to perform a traditional examination.  This may limit your provider's ability to fully assess your condition.  If your provider identifies any concerns that need to be evaluated in person or the need to arrange testing (such as labs, EKG, etc.), we will make arrangements to do so.     Although advances in technology are sophisticated, we cannot ensure that it will always work on either your end or our end.  If the connection with a video visit is poor, the visit may have to be switched to a telephone visit.  With either a video or telephone visit, we are not always able to ensure that we have a secure connection.     I need to obtain your verbal consent now.   Are you willing to proceed with your visit today?    Linda Berry has provided verbal consent on 01/22/2021 for a virtual visit (video or telephone).   [name], PA-C   Date: 01/22/2021 4:43 PM   Virtual Visit via Video Note   I, [name], connected with  Linda Berry  ([id number], November 29, 1995) on 01/22/21 at  4:30 PM EDT by a video-enabled telemedicine application and verified that I am speaking with the correct person using two identifiers.  Location: Patient: Virtual Visit Location Patient: Home Provider: Virtual Visit Location Provider: Home Office   I discussed the limitations of evaluation and  management by telemedicine and the availability of in person appointments. The patient expressed understanding and agreed to proceed.    History of Present Illness: Linda Berry is a 25 y.o. who identifies as a female who was assigned female at birth, and is being seen today for congestion and cough.  HPI: Cough This is a new problem. The current episode started in the past 7 days (2 days ago). The problem has been gradually worsening. The problem occurs every few minutes. The cough is Productive of sputum. Associated symptoms include chills, ear congestion, headaches, nasal congestion, postnasal drip, rhinorrhea, a sore throat (only with coughing) and wheezing. Pertinent negatives include no ear pain, fever or shortness of breath. The symptoms are aggravated by lying down. Treatments tried: dayquil, nyquil, nasal spray, tylenol, flonase. The treatment provided no relief. Her past medical history is significant for asthma and bronchitis.   Patient has not tested for covid yet, plans to test tomorrow. Does have a few sick contacts.  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   Scoliosis 06/23/2018   Asthma affecting pregnancy, antepartum 06/23/2018    Allergies:  Allergies  Allergen Reactions   Ammonia Hives    Cleaning product   Medications:  Current Outpatient Medications:    benzonatate (TESSALON) 100 MG capsule, Take 1 capsule (100 mg total) by mouth 3 (three) times daily as needed., Disp: 30 capsule, Rfl: 0   brompheniramine-pseudoephedrine-DM 30-2-10 MG/5ML syrup, Take 5 mLs by mouth 4 (four) times daily as needed., Disp: 120 mL, Rfl: 0   ipratropium (ATROVENT) 0.03 % nasal spray, Place 2 sprays into both nostrils every 12 (twelve) hours., Disp: 30 mL, Rfl: 0   albuterol (VENTOLIN HFA) 108 (90 Base) MCG/ACT inhaler, Inhale 2  puffs into the lungs every 6 (six) hours as needed for wheezing or shortness of breath., Disp: 18 g, Rfl: 0   fluticasone (FLONASE) 50 MCG/ACT nasal spray, Place 2 sprays into both nostrils daily., Disp: 16 g, Rfl: 0   levocetirizine (XYZAL) 5 MG tablet, Take 1 tablet (5 mg total) by mouth every evening. (Patient not taking: No sig reported), Disp: 30 tablet, Rfl: 0  Observations/Objective: Patient is well-developed, well-nourished in no acute distress.  Resting comfortably at home.  Head is normocephalic, atraumatic.  No labored breathing.  Speech is clear and coherent with logical content.  Patient is alert and oriented at baseline.  Dry cough heard x 2 Patient does have a lot of nasal congestion by voice quality  Assessment and Plan: 1. Mild intermittent asthma without complication - albuterol (VENTOLIN HFA) 108 (90 Base) MCG/ACT inhaler; Inhale 2 puffs into the lungs every 6 (six) hours as needed for wheezing or shortness of breath.  Dispense: 18 g; Refill: 0 - benzonatate (TESSALON) 100 MG capsule; Take 1 capsule (100 mg total) by mouth 3 (three) times daily as needed.  Dispense: 30 capsule; Refill: 0 - brompheniramine-pseudoephedrine-DM 30-2-10 MG/5ML syrup; Take 5 mLs by mouth 4 (four) times daily as needed.  Dispense: 120 mL; Refill: 0 - ipratropium (ATROVENT) 0.03 % nasal spray; Place 2 sprays into both nostrils every 12 (twelve) hours.  Dispense: 30 mL; Refill: 0  2. Suspected COVID-19 virus infection - benzonatate (TESSALON) 100 MG capsule; Take 1 capsule (100 mg total) by mouth 3 (three) times daily as needed.  Dispense: 30 capsule; Refill: 0 - brompheniramine-pseudoephedrine-DM 30-2-10 MG/5ML syrup; Take 5 mLs by mouth 4 (four) times daily as needed.  Dispense: 120 mL; Refill: 0 - ipratropium (ATROVENT) 0.03 % nasal spray; Place 2 sprays into both nostrils every 12 (twelve) hours.  Dispense: 30 mL; Refill: 0  - Continue OTC symptomatic management of choice - Will send OTC  vitamins and supplement information through [facility] - Ipratropium for nasal congestion and drainage, tessalon perles and bromfed DM for cough, albuterol for wheezing prescribed - Patient enrolled in MyChart symptom monitoring - Push fluids - Rest as needed - Discussed return precautions and when to seek in-person evaluation, sent via [facility] as well  Follow Up Instructions: I discussed the assessment and treatment plan with the patient. The patient was provided an opportunity to ask questions and all were answered. The patient agreed with the plan and demonstrated an understanding of the instructions.  A copy of instructions were sent to the patient via MyChart unless otherwise noted below.    The patient was advised to call back or seek an in-person evaluation if the symptoms worsen or if the condition fails to improve as anticipated.  Time:  I spent 16 minutes with the patient via telehealth technology discussing the above problems/concerns.    [name], PA-C

## 2021-01-22 NOTE — Patient Instructions (Signed)
Linda Berry, thank you for joining Linda Loveless, PA-C for today's virtual visit.  While this provider is not your primary care provider (PCP), if your PCP is located in our provider database this encounter information will be shared with them immediately following your visit.  Consent: (Patient) Linda Berry provided verbal consent for this virtual visit at the beginning of the encounter.  Current Medications:  Current Outpatient Medications:    benzonatate (TESSALON) 100 MG capsule, Take 1 capsule (100 mg total) by mouth 3 (three) times daily as needed., Disp: 30 capsule, Rfl: 0   brompheniramine-pseudoephedrine-DM 30-2-10 MG/5ML syrup, Take 5 mLs by mouth 4 (four) times daily as needed., Disp: 120 mL, Rfl: 0   ipratropium (ATROVENT) 0.03 % nasal spray, Place 2 sprays into both nostrils every 12 (twelve) hours., Disp: 30 mL, Rfl: 0   albuterol (VENTOLIN HFA) 108 (90 Base) MCG/ACT inhaler, Inhale 2 puffs into the lungs every 6 (six) hours as needed for wheezing or shortness of breath., Disp: 18 g, Rfl: 0   fluticasone (FLONASE) 50 MCG/ACT nasal spray, Place 2 sprays into both nostrils daily., Disp: 16 g, Rfl: 0   levocetirizine (XYZAL) 5 MG tablet, Take 1 tablet (5 mg total) by mouth every evening. (Patient not taking: No sig reported), Disp: 30 tablet, Rfl: 0   Medications ordered in this encounter:  Meds ordered this encounter  Medications   albuterol (VENTOLIN HFA) 108 (90 Base) MCG/ACT inhaler    Sig: Inhale 2 puffs into the lungs every 6 (six) hours as needed for wheezing or shortness of breath.    Dispense:  18 g    Refill:  0    Order Specific Question:   Supervising Provider    Answer:   MILLER, BRIAN [[id number]]   benzonatate (TESSALON) 100 MG capsule    Sig: Take 1 capsule (100 mg total) by mouth 3 (three) times daily as needed.    Dispense:  30 capsule    Refill:  0    Order Specific Question:   Supervising Provider    Answer:   Hyacinth Meeker, BRIAN [[id number]]    brompheniramine-pseudoephedrine-DM 30-2-10 MG/5ML syrup    Sig: Take 5 mLs by mouth 4 (four) times daily as needed.    Dispense:  120 mL    Refill:  0    Order Specific Question:   Supervising Provider    Answer:   MILLER, BRIAN [[id number]]   ipratropium (ATROVENT) 0.03 % nasal spray    Sig: Place 2 sprays into both nostrils every 12 (twelve) hours.    Dispense:  30 mL    Refill:  0    Order Specific Question:   Supervising Provider    Answer:   Hyacinth Meeker, BRIAN [[id number]]     *If you need refills on other medications prior to your next appointment, please contact your pharmacy*  Follow-Up: Call back or seek an in-person evaluation if the symptoms worsen or if the condition fails to improve as anticipated.  Other Instructions See below   If you have been instructed to have an in-person evaluation today at a local Urgent Care facility, please use the link below. It will take you to a list of all of our available [facility], including address, phone number and hours of operation. Please do not delay care.  [facility]  If you or a family member do not have a primary care provider, use the link below to schedule a visit and establish care. When  you choose a [facility] primary care physician or advanced practice provider, you gain a long-term partner in health. Find a Primary Care Provider  Learn more about [facility]'s in-office and virtual care options: [facility] - Get Care Now  Hello Linda Berry,  You are being placed in the home monitoring program for COVID-19 (commonly known as Coronavirus).  This is because you are suspected to have the virus or are known to have the virus.  If you are unsure which group you fall into call your clinic.    As part of this program, you'll answer a daily questionnaire in the MyChart mobile app. You'll receive a notification through the MyChart app when the questionnaire is available. When you log in to MyChart, you'll see the tasks  in your To Do activity.       Clinicians will see any answers that are concerning and take appropriate steps.  If at any point you are having a medical emergency, call 911.  If otherwise concerned call your clinic instead of coming into the clinic or hospital.  To keep from spreading the disease you should: Stay home and limit contact with other people as much as possible.  Wash your hands frequently. Cover your coughs and sneezes with a tissue, and throw used tissues in the trash.   Clean and disinfect frequently touched surfaces and objects.    Take care of yourself by: Staying home Resting Drinking fluids Take fever-reducing medications (Tylenol/Acetaminophen and Ibuprofen)  For more information on the disease go to the Centers for Disease Control and Prevention website     COVID-19: What to Do if You Are Sick If you test positive and are an older adult or someone who is at high risk of getting very sick from COVID-19, treatment may be available. Contact a healthcare provider right away after a positive test to determine if you are eligible, even if your symptoms are mild right now. You can also visit a Test to Treat location and, if eligible, receive a prescription from a provider. Don't delay: Treatment must be started within the first few days to be effective. If you have a fever, cough, or other symptoms, you might have COVID-19. Most people have mild illness and are able to recover at home. If you are sick: Keep track of your symptoms. If you have an emergency warning sign (including trouble breathing), call 911. Steps to help prevent the spread of COVID-19 if you are sick If you are sick with COVID-19 or think you might have COVID-19, follow the steps below to care for yourself and to help protect other people in your home and community. Stay home except to get medical care Stay home. Most people with COVID-19 have mild illness and can recover at home without medical care. Do  not leave your home, except to get medical care. Do not visit public areas and do not go to places where you are unable to wear a mask. Take care of yourself. Get rest and stay hydrated. Take over-the-counter medicines, such as acetaminophen, to help you feel better. Stay in touch with your doctor. Call before you get medical care. Be sure to get care if you have trouble breathing, or have any other emergency warning signs, or if you think it is an emergency. Avoid public transportation, ride-sharing, or taxis if possible. Get tested If you have symptoms of COVID-19, get tested. While waiting for test results, stay away from others, including staying apart from those living in your  household. Get tested as soon as possible after your symptoms start. Treatments may be available for people with COVID-19 who are at risk for becoming very sick. Don't delay: Treatment must be started early to be effective--some treatments must begin within 5 days of your first symptoms. Contact your healthcare provider right away if your test result is positive to determine if you are eligible. Self-tests are one of several options for testing for the virus that causes COVID-19 and may be more convenient than laboratory-based tests and point-of-care tests. Ask your healthcare provider or your local health department if you need help interpreting your test results. You can visit your state, tribal, local, and territorial health department's website to look for the latest local information on testing sites. Separate yourself from other people As much as possible, stay in a specific room and away from other people and pets in your home. If possible, you should use a separate bathroom. If you need to be around other people or animals in or outside of the home, wear a well-fitting mask. Tell your close contacts that they may have been exposed to COVID-19. An infected person can spread COVID-19 starting 48 hours (or 2 days) before  the person has any symptoms or tests positive. By letting your close contacts know they may have been exposed to COVID-19, you are helping to protect everyone. See COVID-19 and Animals if you have questions about pets. If you are diagnosed with COVID-19, someone from the health department may call you. Answer the call to slow the spread. Monitor your symptoms Symptoms of COVID-19 include fever, cough, or other symptoms. Follow care instructions from your healthcare provider and local health department. Your local health authorities may give instructions on checking your symptoms and reporting information. When to seek emergency medical attention Look for emergency warning signs* for COVID-19. If someone is showing any of these signs, seek emergency medical care immediately: Trouble breathing Persistent pain or pressure in the chest New confusion Inability to wake or stay awake Pale, gray, or blue-colored skin, lips, or nail beds, depending on skin tone *This list is not all possible symptoms. Please call your medical provider for any other symptoms that are severe or concerning to you. Call 911 or call ahead to your local emergency facility: Notify the operator that you are seeking care for someone who has or may have COVID-19. Call ahead before visiting your doctor Call ahead. Many medical visits for routine care are being postponed or done by phone or telemedicine. If you have a medical appointment that cannot be postponed, call your doctor's office, and tell them you have or may have COVID-19. This will help the office protect themselves and other patients. If you are sick, wear a well-fitting mask You should wear a mask if you must be around other people or animals, including pets (even at home). Wear a mask with the best fit, protection, and comfort for you. You don't need to wear the mask if you are alone. If you can't put on a mask (because of trouble breathing, for example), cover your  coughs and sneezes in some other way. Try to stay at least 6 feet away from other people. This will help protect the people around you. Masks should not be placed on young children under age 23 years, anyone who has trouble breathing, or anyone who is not able to remove the mask without help. Cover your coughs and sneezes Cover your mouth and nose with a tissue when you cough  or sneeze. Throw away used tissues in a lined trash can. Immediately wash your hands with soap and water for at least 20 seconds. If soap and water are not available, clean your hands with an alcohol-based hand sanitizer that contains at least 60% alcohol. Clean your hands often Wash your hands often with soap and water for at least 20 seconds. This is especially important after blowing your nose, coughing, or sneezing; going to the bathroom; and before eating or preparing food. Use hand sanitizer if soap and water are not available. Use an alcohol-based hand sanitizer with at least 60% alcohol, covering all surfaces of your hands and rubbing them together until they feel dry. Soap and water are the best option, especially if hands are visibly dirty. Avoid touching your eyes, nose, and mouth with unwashed hands. Handwashing Tips Avoid sharing personal household items Do not share dishes, drinking glasses, cups, eating utensils, towels, or bedding with other people in your home. Wash these items thoroughly after using them with soap and water or put in the dishwasher. Clean surfaces in your home regularly Clean and disinfect high-touch surfaces (for example, doorknobs, tables, handles, light switches, and countertops) in your "sick room" and bathroom. In shared spaces, you should clean and disinfect surfaces and items after each use by the person who is ill. If you are sick and cannot clean, a caregiver or other person should only clean and disinfect the area around you (such as your bedroom and bathroom) on an as needed basis.  Your caregiver/other person should wait as long as possible (at least several hours) and wear a mask before entering, cleaning, and disinfecting shared spaces that you use. Clean and disinfect areas that may have blood, stool, or body fluids on them. Use household cleaners and disinfectants. Clean visible dirty surfaces with household cleaners containing soap or detergent. Then, use a household disinfectant. Use a product from [employer] List N: Disinfectants for Coronavirus (COVID-19). Be sure to follow the instructions on the label to ensure safe and effective use of the product. Many products recommend keeping the surface wet with a disinfectant for a certain period of time (look at "contact time" on the product label). You may also need to wear personal protective equipment, such as gloves, depending on the directions on the product label. Immediately after disinfecting, wash your hands with soap and water for 20 seconds. For completed guidance on cleaning and disinfecting your home, visit Complete Disinfection Guidance. Take steps to improve ventilation at home Improve ventilation (air flow) at home to help prevent from spreading COVID-19 to other people in your household. Clear out COVID-19 virus particles in the air by opening windows, using air filters, and turning on fans in your home. Use this interactive tool to learn how to improve air flow in your home. When you can be around others after being sick with COVID-19 Deciding when you can be around others is different for different situations. Find out when you can safely end home isolation. For any additional questions about your care, contact your healthcare provider or state or local health department. 07/18/2020 Content source: [facility] ([facility]), Division of Viral Diseases This information is not intended to replace advice given to you by your health care provider. Make sure you discuss any  questions you have with your health care provider. Document Revised: 08/31/2020 Document Reviewed: 08/31/2020 Elsevier Patient Education  2022 Elsevier Inc.   Can take to lessen severity: Vit C 500mg  twice daily  Quercertin 250-500mg  twice daily Zinc 75-100mg  daily Melatonin 3-6 mg at bedtime Vit D3 1000-2000 IU daily Aspirin 81 mg daily with food Optional: Famotidine 20mg  daily Also can add tylenol/ibuprofen as needed for fevers and body aches May add Mucinex or Mucinex DM as needed for cough/congestion

## 2021-01-29 ENCOUNTER — Encounter: Payer: Self-pay | Admitting: Physician Assistant

## 2021-01-29 ENCOUNTER — Telehealth: Payer: Medicaid Other | Admitting: Family Medicine

## 2021-01-29 DIAGNOSIS — J0140 Acute pansinusitis, unspecified: Secondary | ICD-10-CM

## 2021-01-29 MED ORDER — AMOXICILLIN-POT CLAVULANATE 875-125 MG PO TABS: 1.0000 | ORAL_TABLET | Freq: Two times a day (BID) | ORAL | 0 refills | Status: DC

## 2021-01-29 NOTE — Progress Notes (Signed)
Virtual Visit Consent   Linda Berry, you are scheduled for a virtual visit with a [facility] provider today.     Just as with appointments in the office, your consent must be obtained to participate.  Your consent will be active for this visit and any virtual visit you may have with one of our providers in the next 365 days.     If you have a MyChart account, a copy of this consent can be sent to you electronically.  All virtual visits are billed to your insurance company just like a traditional visit in the office.    As this is a virtual visit, video technology does not allow for your provider to perform a traditional examination.  This may limit your provider's ability to fully assess your condition.  If your provider identifies any concerns that need to be evaluated in person or the need to arrange testing (such as labs, EKG, etc.), we will make arrangements to do so.     Although advances in technology are sophisticated, we cannot ensure that it will always work on either your end or our end.  If the connection with a video visit is poor, the visit may have to be switched to a telephone visit.  With either a video or telephone visit, we are not always able to ensure that we have a secure connection.     I need to obtain your verbal consent now.   Are you willing to proceed with your visit today?    [name] has provided verbal consent on 01/29/2021 for a virtual visit (video or telephone).   Freddy Finner, NP   Date: 01/29/2021 9:33 AM   Virtual Visit via Video Note   I, Freddy Finner, connected with  Linda Berry  ([id number], [birth year]) on 01/29/21 at  9:30 AM EDT by a video-enabled telemedicine application and verified that I am speaking with the correct person using two identifiers.  Location: Patient: Virtual Visit Location Patient: Home Provider: Virtual Visit Location Provider: Home Office   I discussed the limitations of evaluation and management by  telemedicine and the availability of in person appointments. The patient expressed understanding and agreed to proceed.    History of Present Illness: Linda Berry is a 25 y.o. who identifies as a female who was assigned female at birth, and is being seen today for on going cough and sinus infection signs.  HPI: HPI  The current episode started in the past 7 days, a week back. Was seen on 01/22/21- see note in Epic. The problem has been gradually worsening. The problem occurs every few minutes. The cough is Productive of sputum. Associated symptoms include chills, ear congestion, headaches, nasal congestion, postnasal drip, rhinorrhea, a sore throat (only with coughing) and wheezing. Pertinent negatives include no ear pain, fever or shortness of breath. The symptoms are aggravated by lying down. Treatments tried: dayquil, nyquil, nasal spray, tylenol, flonase. The treatment provided no relief. Her past medical history is significant for asthma and   She has tested negative for covid now, twin daughters had RSV, she reports thickening mucus and congestion and overall worsening with sinus symptoms.        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   Scoliosis 06/23/2018   Asthma affecting pregnancy, antepartum 06/23/2018  Allergies:  Allergies  Allergen Reactions   Ammonia Hives    Cleaning product   Medications:  Current Outpatient Medications:    albuterol (VENTOLIN HFA) 108 (90 Base) MCG/ACT inhaler, Inhale 2 puffs into the lungs every 6 (six) hours as needed for wheezing or shortness of breath., Disp: 18 g, Rfl: 0   benzonatate (TESSALON) 100 MG capsule, Take 1 capsule (100 mg total) by mouth 3 (three) times daily as needed., Disp: 30 capsule, Rfl: 0   brompheniramine-pseudoephedrine-DM 30-2-10 MG/5ML syrup, Take 5 mLs by mouth  4 (four) times daily as needed., Disp: 120 mL, Rfl: 0   fluticasone (FLONASE) 50 MCG/ACT nasal spray, Place 2 sprays into both nostrils daily., Disp: 16 g, Rfl: 0   ipratropium (ATROVENT) 0.03 % nasal spray, Place 2 sprays into both nostrils every 12 (twelve) hours., Disp: 30 mL, Rfl: 0   levocetirizine (XYZAL) 5 MG tablet, Take 1 tablet (5 mg total) by mouth every evening. (Patient not taking: No sig reported), Disp: 30 tablet, Rfl: 0  Observations/Objective: Patient is well-developed, well-nourished in no acute distress.  Resting comfortably  at home.  Head is normocephalic, atraumatic.  No labored breathing.  Speech is clear and coherent with logical content.  Patient is alert and oriented at baseline.  Dry hacky cough noted. Nasal tone and congestion present  Assessment and Plan:  1. Acute non-recurrent pansinusitis   - amoxicillin-clavulanate (AUGMENTIN) 875-125 MG tablet; Take 1 tablet by mouth 2 (two) times daily.  Dispense: 20 tablet; Refill: 0   -covid negative -OTC measures reviewed and continued -continue previous medications from last weeks visit -start aug -work note provided for today   Reviewed side effects, risks and benefits of medication.   Patient acknowledged agreement and understanding of the plan.  I discussed the assessment and treatment plan with the patient. The patient was provided an opportunity to ask questions and all were answered. The patient agreed with the plan and demonstrated an understanding of the instructions.   The patient was advised to call back or seek an in-person evaluation if the symptoms worsen or if the condition fails to improve as anticipated.   The above assessment and management plan was discussed with the patient. The patient verbalized understanding of and has agreed to the management plan. Patient is aware to call the clinic if symptoms persist or worsen. Patient is aware when to return to the clinic for a follow-up visit.  Patient educated on when it is appropriate to go to the emergency department.   Follow Up Instructions: I discussed the assessment and treatment plan with the patient. The patient was provided an opportunity to ask questions and all were answered. The patient agreed with the plan and demonstrated an understanding of the instructions.  A copy of instructions were sent to the patient via MyChart unless otherwise noted below.     The patient was advised to call back or seek an in-person evaluation if the symptoms worsen or if the condition fails to improve as anticipated.  Time:  I spent 10 minutes with the patient via telehealth technology discussing the above problems/concerns.    Freddy Finner, NP

## 2021-01-29 NOTE — Patient Instructions (Signed)
I appreciate the opportunity to provide you with care for your health and wellness.  Start and complete Augmentin medication today Work note to return on 01/30/2021 pending symptoms  Please continue to practice social distancing to keep you, your family, and our community safe.  If you must go out, please wear a mask and practice good handwashing.  Have a wonderful day. With Gratitude, Tereasa Coop, DNP, AGNP-BC

## 2021-05-02 DIAGNOSIS — F331 Major depressive disorder, recurrent, moderate: Secondary | ICD-10-CM | POA: Diagnosis not present

## 2021-05-09 ENCOUNTER — Ambulatory Visit: Payer: Medicaid Other

## 2021-05-16 DIAGNOSIS — F331 Major depressive disorder, recurrent, moderate: Secondary | ICD-10-CM | POA: Diagnosis not present

## 2021-05-21 ENCOUNTER — Ambulatory Visit: Payer: Medicaid Other

## 2021-06-01 ENCOUNTER — Ambulatory Visit (INDEPENDENT_AMBULATORY_CARE_PROVIDER_SITE_OTHER): Payer: Medicaid Other

## 2021-06-01 ENCOUNTER — Other Ambulatory Visit: Payer: Self-pay

## 2021-06-01 DIAGNOSIS — Z3490 Encounter for supervision of normal pregnancy, unspecified, unspecified trimester: Secondary | ICD-10-CM

## 2021-06-01 DIAGNOSIS — Z3200 Encounter for pregnancy test, result unknown: Secondary | ICD-10-CM

## 2021-06-01 DIAGNOSIS — Z3201 Encounter for pregnancy test, result positive: Secondary | ICD-10-CM

## 2021-06-01 LAB — POCT PREGNANCY, URINE: Preg Test, Ur: POSITIVE — AB

## 2021-06-01 NOTE — Progress Notes (Signed)
Chart reviewed for nurse visit. Agree with plan of care.   Venora Maples, MD 06/01/21 10:22 AM

## 2021-06-01 NOTE — Progress Notes (Signed)
Possible Pregnancy  Here today for pregnancy confirmation. UPT in office today is positive. Pt reports first positive home UPT on 05/29/21. Reviewed dating with patient:   LMP: approx 04/12/21-04/17/21 Dating [place] ordered  Dating [place] scheduled 06/12/21 @ 0900; pt to arrive 30 minutes early. MyChart message sent with update. OB history reviewed; vaginal delivery of twins in 2020. Reviewed medications and allergies with patient. Currently followed by [facility], [facility] for mental health. Patient reports stopping Trazadone and Zoloft after positive UPT. Patient reports issues sleeping since not taking. Reviewed with Crissie Reese, MD who states there have been no observed adverse effects on pregnancy and patient may continue if these are beneficial to patient. Reviewed with patient. Will schedule OB appts following dating [place].  Marjo Bicker, RN 06/01/2021  9:43 AM

## 2021-06-04 ENCOUNTER — Encounter (HOSPITAL_COMMUNITY): Payer: Self-pay | Admitting: Family Medicine

## 2021-06-04 ENCOUNTER — Other Ambulatory Visit: Payer: Self-pay

## 2021-06-04 ENCOUNTER — Inpatient Hospital Stay (HOSPITAL_COMMUNITY): Payer: Medicaid Other

## 2021-06-04 ENCOUNTER — Inpatient Hospital Stay (HOSPITAL_COMMUNITY)
Admission: AD | Admit: 2021-06-04 | Discharge: 2021-06-04 | Disposition: A | Discharge status: Home / Self Care | Payer: Medicaid Other | Attending: Family Medicine | Admitting: Family Medicine

## 2021-06-04 DIAGNOSIS — O209 Hemorrhage in early pregnancy, unspecified: Secondary | ICD-10-CM

## 2021-06-04 DIAGNOSIS — O039 Complete or unspecified spontaneous abortion without complication: Secondary | ICD-10-CM | POA: Insufficient documentation

## 2021-06-04 DIAGNOSIS — Z3A01 Less than 8 weeks gestation of pregnancy: Secondary | ICD-10-CM | POA: Insufficient documentation

## 2021-06-04 DIAGNOSIS — O09291 Supervision of pregnancy with other poor reproductive or obstetric history, first trimester: Secondary | ICD-10-CM | POA: Diagnosis not present

## 2021-06-04 DIAGNOSIS — N939 Abnormal uterine and vaginal bleeding, unspecified: Secondary | ICD-10-CM | POA: Diagnosis not present

## 2021-06-04 DIAGNOSIS — R9389 Abnormal findings on diagnostic imaging of other specified body structures: Secondary | ICD-10-CM | POA: Diagnosis not present

## 2021-06-04 DIAGNOSIS — Z6740 Type O blood, Rh positive: Secondary | ICD-10-CM

## 2021-06-04 DIAGNOSIS — Z3A00 Weeks of gestation of pregnancy not specified: Secondary | ICD-10-CM | POA: Diagnosis not present

## 2021-06-04 LAB — HCG, QUANTITATIVE, PREGNANCY: hCG, Beta Chain, Quant, S: 12766 m[IU]/mL — ABNORMAL HIGH (ref <5)

## 2021-06-04 LAB — CBC
HCT: 37.6 % (ref 36.0–46.0)
Hemoglobin: 13.1 g/dL (ref 12.0–15.0)
MCH: 31.4 pg (ref 26.0–34.0)
MCHC: 34.8 g/dL (ref 30.0–36.0)
MCV: 90.2 fL (ref 80.0–100.0)
Platelets: 254 10*3/uL (ref 150–400)
RBC: 4.17 MIL/uL (ref 3.87–5.11)
RDW: 12.4 % (ref 11.5–15.5)
WBC: 13.2 10*3/uL — ABNORMAL HIGH (ref 4.0–10.5)
nRBC: 0 % (ref 0.0–0.2)

## 2021-06-04 MED ORDER — OXYCODONE-ACETAMINOPHEN 5-325 MG PO TABS
2.0000 | ORAL_TABLET | Freq: Once | ORAL | Status: AC
Start: 2021-06-04 — End: 2021-06-04
  Administered 2021-06-04: 2 via ORAL
  Filled 2021-06-04: qty 2

## 2021-06-04 MED ORDER — IBUPROFEN 800 MG PO TABS: 800.0000 mg | ORAL_TABLET | Freq: Three times a day (TID) | ORAL | 0 refills | Status: AC | PRN

## 2021-06-04 MED ORDER — OXYCODONE HCL 5 MG PO TABS: 5.0000 mg | ORAL_TABLET | Freq: Four times a day (QID) | ORAL | 0 refills | Status: AC | PRN

## 2021-06-04 NOTE — MAU Note (Signed)
Presents with c/o VB that began today, states VB noted on crotch of underwear and with wiping.  Denies recent intercourse, but was utilizing a sexual toy prior to incident.  States "toy" was inserted inside the vagina.  Denies abdominal pain/cramping. LMP between 12/18 and 12/20.

## 2021-06-04 NOTE — MAU Note (Signed)
Pt went to [facility] after Triage and started having heavy bleeding. Pt cried out and large clot with gestational sack noted on the floor. Assisted pt to room and assessed bleeding and pain. Minimal vag bleeding noted at this time. And pot stated she was not in pain. Notified provider.  Pt very upset and crying. attempted to comfort and had S.O. at bedside as well.

## 2021-06-04 NOTE — MAU Provider Note (Addendum)
History     CSN: [id number]  Arrival date and time: 06/04/21 1755   Event Date/Time   First Provider Initiated Contact with Patient 06/04/21 1900      Chief Complaint  Patient presents with   Vaginal Bleeding   Linda Berry is 26 y.o. G2P0102 at [redacted]w[redacted]d who presents today with vaginal bleeding. She states that she had some spotting and minimal cramping. However on arrival here cramping became much worse and bleeding increased. Patient was taken to a room by RN, and then proceeded to pass tissue and a large blood clot on the floor in the bathroom. She was taken to bed and reported that pain has diminished and bleeding is decreasing.   Vaginal Bleeding The patient's primary symptoms include vaginal bleeding. This is a new problem. The current episode started today. The problem occurs constantly. The problem has been rapidly worsening. The problem affects both sides. She is pregnant. The vaginal discharge was bloody. The vaginal bleeding is heavier than menses. She has been passing clots. She has been passing tissue. Nothing aggravates the symptoms. She has tried nothing for the symptoms.   OB History     Gravida  2   Para  1   Term      Preterm  1   AB      Living  2      SAB      IAB      Ectopic      Multiple  1   Live Births  2           Past Medical History:  Diagnosis Date   Arthritis    Phreesia 05/01/2020   Asthma    Brachial plexus injury, left    @ birth   IUGR (intrauterine growth restriction) affecting care of mother 10/29/2018   PONV (postoperative nausea and vomiting)    Scoliosis     Past Surgical History:  Procedure Laterality Date   arm surgery     brachial plexus, x4 surgeries   LEG SURGERY     nerve transfers behind legs   MASS EXCISION Left 04/17/2020   Procedure: EXCISION OF LEFT AXILLARY MASS;  Surgeon: Emelia Loron, MD;  Location: [facility];  Service: General;  Laterality: Left;   NECK SURGERY     x2    SPINE SURGERY N/A    Phreesia 05/01/2020   WISDOM TOOTH EXTRACTION      Family History  Problem Relation Age of Onset   Diabetes Mother    Hypertension Mother    Huntington's disease Mother    Ulcers Father    Cancer Father     Social History   Tobacco Use   Smoking status: Former    Types: Cigarettes    Quit date: 05/09/2018    Years since quitting: 3.0   Smokeless tobacco: Never  Vaping Use   Vaping Use: Never used  Substance Use Topics   Alcohol use: Not Currently   Drug use: Not Currently    Types: Marijuana    Comment: last use December    Allergies:  Allergies  Allergen Reactions   Ammonia Hives    Cleaning product    Medications Prior to Admission  Medication Sig Dispense Refill Last Dose   sertraline (ZOLOFT) 100 MG tablet Take 100 mg by mouth daily.   06/03/2021   traZODone (DESYREL) 50 MG tablet Take 25-50 mg by mouth at bedtime.   06/03/2021   albuterol (VENTOLIN HFA) 108 (90  Base) MCG/ACT inhaler Inhale 2 puffs into the lungs every 6 (six) hours as needed for wheezing or shortness of breath. 18 g 0     Review of Systems  Genitourinary:  Positive for vaginal bleeding.  All other systems reviewed and are negative. Physical Exam   Blood pressure 110/75, pulse 99, temperature 98.8 F (37.1 C), temperature source Axillary, resp. rate 20, height 5' (1.524 m), weight 63.3 kg, last menstrual period 04/17/2021, SpO2 99 %.  Physical Exam Constitutional:      Appearance: She is well-developed.     Comments: Upset and crying   HENT:     Head: Normocephalic.  Eyes:     Pupils: Pupils are equal, round, and reactive to light.  Cardiovascular:     Rate and Rhythm: Normal rate and regular rhythm.     Heart sounds: Normal heart sounds.  Pulmonary:     Effort: Pulmonary effort is normal. No respiratory distress.     Breath sounds: Normal breath sounds.  Abdominal:     Palpations: Abdomen is soft.     Tenderness: There is no abdominal tenderness.   Genitourinary:    Vagina: No bleeding.     Comments:   Musculoskeletal:        General: Normal range of motion.     Cervical back: Normal range of motion and neck supple.  Skin:    General: Skin is warm and dry.  Neurological:     Mental Status: She is alert and oriented to person, place, and time.  Psychiatric:        Mood and Affect: Mood normal.        Behavior: Behavior normal.     Results for orders placed or performed during the hospital encounter of 06/04/21 (from the past 24 hour(s))  CBC     Status: Abnormal   Collection Time: 06/04/21  7:48 PM  Result Value Ref Range   WBC 13.2 (H) 4.0 - 10.5 K/uL   RBC 4.17 3.87 - 5.11 MIL/uL   Hemoglobin 13.1 12.0 - 15.0 g/dL   HCT 85.2 77.8 - 24.2 %   MCV 90.2 80.0 - 100.0 fL   MCH 31.4 26.0 - 34.0 pg   MCHC 34.8 30.0 - 36.0 g/dL   RDW 35.3 61.4 - 43.1 %   Platelets 254 150 - 400 K/uL   nRBC 0.0 0.0 - 0.2 %   US OB LESS THAN 14 WEEKS WITH OB TRANSVAGINAL  Result Date: 06/04/2021 CLINICAL DATA:  Vaginal bleeding. EXAM: OBSTETRIC <14 WK [place] AND TRANSVAGINAL OB US TECHNIQUE: Both transabdominal and transvaginal ultrasound examinations were performed for complete evaluation of the gestation as well as the maternal uterus, adnexal regions, and pelvic cul-de-sac. Transvaginal technique was performed to assess early pregnancy. COMPARISON:  None. FINDINGS: Intrauterine gestational sac: None Endometrium: Endometrium is mildly thickened measuring 12 mm. Endometrium is heterogeneous and swirling blood products are seen in the endometrial canal. Maternal uterus/adnexae: Bilateral ovaries appear within normal limits. No adnexal masses. No pelvic free fluid. IMPRESSION: 1. No intrauterine gestational sac identified. The endometrium is heterogeneous and contains hemorrhagic products. Findings may be related to abortion in progress. Early normal IUP and occult ectopic pregnancy are less likely, but not excluded in the appropriate clinical setting.  Electronically Signed   By: Darliss Cheney M.D.   On: 06/04/2021 20:49    MAU Course  Procedures  MDM  Products of conception collected. Patient worried about [place] "throwing away her baby". Offered for her to take POCs  home. Patient desires to take POCs  home.   Bleeding and pain is minimal at this time. Will DC home with plan for SAB follow up in one week in the clinic.      Assessment and Plan   1. SAB (spontaneous abortion)   2. Vaginal bleeding in pregnancy, first trimester   3. Type O blood, Rh positive   4. Vaginal bleeding affecting early pregnancy    DC home Bleeding precautions RX: ibuprofen 800mg  PRN, Oxycodone 5-10mg  PRN  Return to MAU as needed    Follow-up Information     [facility] Follow [facility].   Specialty: Obstetrics and Gynecology Why: They will call you with an appointment Contact information: [address] [place] [address] [phone]               Thressa Sheller DNP, CNM  06/04/21  9:10 PM

## 2021-06-11 ENCOUNTER — Telehealth: Payer: Self-pay

## 2021-06-11 ENCOUNTER — Telehealth: Payer: Self-pay | Admitting: Family Medicine

## 2021-06-11 NOTE — Telephone Encounter (Signed)
Patient called - has ultrasound in radiology scheduled for Tuesday 06/12/21 - patient miscarried - was calling to see if she still needed this ultrasound appointment - sent secure chat to [facility] to see if this ultrasound was still needed - received a response from Gershon Crane RN - patient no longer needed the dating ultrasound that she was scheduled for in Radiology on Tuesday

## 2021-06-12 ENCOUNTER — Ambulatory Visit: Payer: Medicaid Other

## 2021-06-14 ENCOUNTER — Other Ambulatory Visit: Payer: Self-pay

## 2021-06-14 ENCOUNTER — Emergency Department (HOSPITAL_COMMUNITY): Payer: Medicaid Other

## 2021-06-14 ENCOUNTER — Emergency Department (HOSPITAL_COMMUNITY)
Admission: EM | Admit: 2021-06-14 | Discharge: 2021-06-14 | Disposition: A | Discharge status: Home / Self Care | Payer: Medicaid Other | Attending: Emergency Medicine | Admitting: Emergency Medicine

## 2021-06-14 ENCOUNTER — Encounter (HOSPITAL_COMMUNITY): Payer: Self-pay

## 2021-06-14 DIAGNOSIS — O039 Complete or unspecified spontaneous abortion without complication: Secondary | ICD-10-CM | POA: Diagnosis not present

## 2021-06-14 DIAGNOSIS — R9431 Abnormal electrocardiogram [ECG] [EKG]: Secondary | ICD-10-CM | POA: Diagnosis not present

## 2021-06-14 DIAGNOSIS — R079 Chest pain, unspecified: Secondary | ICD-10-CM | POA: Diagnosis not present

## 2021-06-14 DIAGNOSIS — R400 Somnolence: Secondary | ICD-10-CM | POA: Diagnosis not present

## 2021-06-14 DIAGNOSIS — T402X1A Poisoning by other opioids, accidental (unintentional), initial encounter: Secondary | ICD-10-CM | POA: Diagnosis not present

## 2021-06-14 DIAGNOSIS — R569 Unspecified convulsions: Secondary | ICD-10-CM | POA: Diagnosis not present

## 2021-06-14 DIAGNOSIS — T50904A Poisoning by unspecified drugs, medicaments and biological substances, undetermined, initial encounter: Secondary | ICD-10-CM | POA: Diagnosis not present

## 2021-06-14 DIAGNOSIS — T40601A Poisoning by unspecified narcotics, accidental (unintentional), initial encounter: Secondary | ICD-10-CM

## 2021-06-14 DIAGNOSIS — G4489 Other headache syndrome: Secondary | ICD-10-CM | POA: Diagnosis not present

## 2021-06-14 DIAGNOSIS — R4020 Unspecified coma: Secondary | ICD-10-CM | POA: Diagnosis not present

## 2021-06-14 LAB — CBC WITH DIFFERENTIAL/PLATELET
Abs Immature Granulocytes: 0.08 10*3/uL — ABNORMAL HIGH (ref 0.00–0.07)
Basophils Absolute: 0.1 10*3/uL (ref 0.0–0.1)
Basophils Relative: 1 %
Eosinophils Absolute: 0 10*3/uL (ref 0.0–0.5)
Eosinophils Relative: 0 %
HCT: 36.6 % (ref 36.0–46.0)
Hemoglobin: 12.6 g/dL (ref 12.0–15.0)
Immature Granulocytes: 1 %
Lymphocytes Relative: 14 %
Lymphs Abs: 2.2 10*3/uL (ref 0.7–4.0)
MCH: 32 pg (ref 26.0–34.0)
MCHC: 34.4 g/dL (ref 30.0–36.0)
MCV: 92.9 fL (ref 80.0–100.0)
Monocytes Absolute: 0.8 10*3/uL (ref 0.1–1.0)
Monocytes Relative: 5 %
Neutro Abs: 12.7 10*3/uL — ABNORMAL HIGH (ref 1.7–7.7)
Neutrophils Relative %: 79 %
Platelets: 231 10*3/uL (ref 150–400)
RBC: 3.94 MIL/uL (ref 3.87–5.11)
RDW: 12.8 % (ref 11.5–15.5)
WBC: 15.9 10*3/uL — ABNORMAL HIGH (ref 4.0–10.5)
nRBC: 0 % (ref 0.0–0.2)

## 2021-06-14 LAB — BASIC METABOLIC PANEL
Anion gap: 10 (ref 5–15)
BUN: 7 mg/dL (ref 6–20)
CO2: 24 mmol/L (ref 22–32)
Calcium: 9.2 mg/dL (ref 8.9–10.3)
Chloride: 103 mmol/L (ref 98–111)
Creatinine, Ser: 0.83 mg/dL (ref 0.44–1.00)
GFR, Estimated: >60 mL/min (ref >60)
Glucose, Bld: 100 mg/dL — ABNORMAL HIGH (ref 70–99)
Potassium: 3.3 mmol/L — ABNORMAL LOW (ref 3.5–5.1)
Sodium: 137 mmol/L (ref 135–145)

## 2021-06-14 MED ORDER — NALOXONE HCL 4 MG/0.1ML NA LIQD
1.0000 | Freq: Once | NASAL | Status: AC
  Administered 2021-06-14: 1 via NASAL
  Filled 2021-06-14: qty 4

## 2021-06-14 NOTE — ED Provider Notes (Signed)
[facility] EMERGENCY DEPARTMENT Provider Note   CSN: [id number] Arrival date & time: 06/14/21  2026     History  Chief Complaint  Patient presents with   Drug Overdose    Accidental overdose. Took two 5mg  prescription oxycodone.    Linda Berry is a 26 y.o. female.  26 yo F with chief complaints of an unintentional opioid overdose.  The patient tells me that she took a couple pain pills for discomforts and then was found unresponsive by her family and they started CPR.  She was given Narcan with EMS with improvement of her mental status.   Drug Overdose      Home Medications Prior to Admission medications   Medication Sig Start Date End Date Taking? Authorizing Provider  albuterol (VENTOLIN HFA) 108 (90 Base) MCG/ACT inhaler Inhale 2 puffs into the lungs every 6 (six) hours as needed for wheezing or shortness of breath. 01/22/21   Mar Daring, PA-C  ibuprofen (ADVIL) 800 MG tablet Take 1 tablet (800 mg total) by mouth every 8 (eight) hours as needed. 06/04/21   Marcille Buffy D, CNM  oxyCODONE (ROXICODONE) 5 MG immediate release tablet Take 1-2 tablets (5-10 mg total) by mouth every 6 (six) hours as needed for severe pain. 06/04/21   Marcille Buffy D, CNM  sertraline (ZOLOFT) 100 MG tablet Take 100 mg by mouth daily. 05/27/21   [provider]  traZODone (DESYREL) 50 MG tablet Take 25-50 mg by mouth at bedtime. 05/14/21   [provider]      Allergies    Ammonia    Review of Systems   Review of Systems  Physical Exam Updated Vital Signs BP (!) 138/108    Pulse 73    Temp 97.6 F (36.4 C) (Oral)    Resp 14    Ht 4\' 11"  (1.499 m)    Wt 64.9 kg    LMP 04/17/2021 (Approximate)    SpO2 99%    Breastfeeding Unknown Comment: diagnosed miscarriage 10 days ago   BMI 28.88 kg/m  Physical Exam Vitals and nursing note reviewed.  Constitutional:      General: She is not in acute distress.    Appearance: She is well-developed. She is  not diaphoretic.  HENT:     Head: Normocephalic and atraumatic.  Eyes:     Pupils: Pupils are equal, round, and reactive to light.  Cardiovascular:     Rate and Rhythm: Normal rate and regular rhythm.     Heart sounds: No murmur heard.   No friction rub. No gallop.  Pulmonary:     Effort: Pulmonary effort is normal.     Breath sounds: No wheezing or rales.  Abdominal:     General: There is no distension.     Palpations: Abdomen is soft.     Tenderness: There is no abdominal tenderness.  Musculoskeletal:        General: No tenderness.     Cervical back: Normal range of motion and neck supple.  Skin:    General: Skin is warm and dry.  Neurological:     Mental Status: She is alert.     Comments: Sleepy on exam, arouses to voice  Psychiatric:        Behavior: Behavior normal.    ED Results / Procedures / Treatments   Labs (all labs ordered are listed, but only abnormal results are displayed) Labs Reviewed  CBC WITH DIFFERENTIAL/PLATELET - Abnormal; Notable for the following components:  Result Value   WBC 15.9 (*)    Neutro Abs 12.7 (*)    Abs Immature Granulocytes 0.08 (*)    All other components within normal limits  BASIC METABOLIC PANEL - Abnormal; Notable for the following components:   Potassium 3.3 (*)    Glucose, Bld 100 (*)    All other components within normal limits    EKG EKG Interpretation  Date/Time:  Thursday June 14 2021 20:32:19 EST Ventricular Rate:  80 PR Interval:  165 QRS Duration: 108 QT Interval:  395 QTC Calculation: 456 R Axis:   9 Text Interpretation: Sinus rhythm RSR' in V1 or V2, right VCD or RVH Borderline T abnormalities, anterior leads No old tracing to compare Confirmed by Deno Etienne [id number]) on 06/14/2021 10:12:20 PM  Radiology DG Chest Port 1 View  Result Date: 06/14/2021 CLINICAL DATA:  chest pain post cpr EXAM: PORTABLE CHEST 1 VIEW COMPARISON:  None. FINDINGS: The heart and mediastinal contours are within normal limits.  No focal consolidation. No pulmonary edema. No pleural effusion. No pneumothorax. No acute osseous abnormality.  Left arm vascular clips. IMPRESSION: No active disease. Electronically Signed   By: Iven Finn M.D.   On: 06/14/2021 21:38    Procedures Procedures    Medications Ordered in ED Medications  naloxone (NARCAN) nasal spray 4 mg/0.1 mL (1 spray Nasal Provided for home use 06/14/21 2311)    ED Course/ Medical Decision Making/ A&P                           Medical Decision Making Amount and/or Complexity of Data Reviewed Labs: ordered. Radiology: ordered.  Risk Prescription drug management.   26 yo F with a chief complaints of altered mental status.  Found to be unresponsive by family and CPR was started.  Patient later found to have taken some oxycodone tablets.  Given Narcan.  Patient somewhat sleepy on my initial exam.  Will obtain blood work chest x-ray reassess.  No significant anemia no significant electrolyte abnormality chest x-ray independently interpreted by me without focal infiltrate.  Patient much more alert on repeat exam.  We will have her follow-up with her family doctor.  Consult peers were placed.  Will give home Narcan.  11:13 PM:  I have discussed the diagnosis/risks/treatment options with the patient.  Evaluation and diagnostic testing in the emergency department does not suggest an emergent condition requiring admission or immediate intervention beyond what has been performed at this time.  They will follow up with  PCP. We also discussed returning to the ED immediately if new or worsening sx occur. We discussed the sx which are most concerning (e.g., sudden worsening pain, fever, inability to tolerate by mouth) that necessitate immediate return. Medications administered to the patient during their visit and any new prescriptions provided to the patient are listed below.  Medications given during this visit Medications  naloxone (NARCAN) nasal spray 4  mg/0.1 mL (1 spray Nasal Provided for home use 06/14/21 2311)     The patient appears reasonably screen and/or stabilized for discharge and I doubt any other medical condition or other [facility] requiring further screening, evaluation, or treatment in the ED at this time prior to discharge.          Final Clinical Impression(s) / ED Diagnoses Final diagnoses:  Opiate overdose, accidental or unintentional, initial encounter [facility])    Rx / DC Orders ED Discharge Orders     None  Deno Etienne, DO 06/14/21 2313

## 2021-06-14 NOTE — ED Triage Notes (Signed)
PT biba from home s/p ingestion of prescription oxycodone. Pt reports she took two 5mg  oxycodone. Pt friends reported seizure like activity for 10 seconds. Bystander CPR administered. Pt given 2mg  narcan IN by ems. Pt also took RX zoloft. Denies SI/ hi. Reports OD was accidental. Recent miscarriage 10 days ago and was prescribed oxycodone for pain.

## 2021-06-14 NOTE — Discharge Instructions (Signed)
There is help if you need it.  Please do not use dirty needles, this could cause you a severe infection to your skin, heart or spinal cord.  This could kill you or leave you permanently disabled.  There was a recent study done at [facility] that showed that the risk of death for someone that had unintentionally overdosed on narcotics was as high as 15% in the next year.  This is much higher than most every other medical condition.  [place] Solution to the Opioid Problem (GCSTOP) Fixed; mobile; peer-based Chase Holleman [phone] [email or web address] Fixed site exchange at [employer], Wednesdays (2-5pm) and Thursdays (4-8pm). [address] [place], [place] [address] Call or text to arrange mobile and peer exchange, Mondays (1-4pm) and Fridays (4-7pm). Serving [place] [email or web address]  Suboxone clinic: [facility] [address] [place], [address]  [facility] [address] [place], [address]  [facility] [address]  [address]  

## 2021-06-15 ENCOUNTER — Telehealth: Payer: Self-pay | Admitting: *Deleted

## 2021-06-15 NOTE — Telephone Encounter (Signed)
Transition Care Management Follow-up Telephone Call Date of discharge and from where: 06/14/2021 - [facility] How have you been since you were released from the hospital? "I am okay now" Any questions or concerns? No  Items Reviewed: Did the pt receive and understand the discharge instructions provided? Yes  Medications obtained and verified?  N/A Other?  No Any new allergies since your discharge? No  Dietary orders reviewed? No Do you have support at home? Yes    Functional Questionnaire: (I = Independent and D = Dependent) ADLs: I  Bathing/Dressing- I  Meal Prep- I  Eating- I  Maintaining continence- I  Transferring/Ambulation- I  Managing Meds- I  Follow up appointments reviewed:  PCP Hospital f/u appt confirmed? No  - No PCP Specialist Hospital f/u appt confirmed? No   Are transportation arrangements needed? No  If their condition worsens, is the pt aware to call PCP or go to the Emergency Dept.? Yes Was the patient provided with contact information for the PCP's office or ED? Yes Was to pt encouraged to call back with questions or concerns? Yes

## 2021-06-22 ENCOUNTER — Ambulatory Visit: Payer: Medicaid Other | Admitting: *Deleted

## 2021-06-22 ENCOUNTER — Other Ambulatory Visit: Payer: Self-pay

## 2021-06-22 DIAGNOSIS — O039 Complete or unspecified spontaneous abortion without complication: Secondary | ICD-10-CM

## 2021-06-23 LAB — BETA HCG QUANT (REF LAB): hCG Quant: 6 m[IU]/mL

## 2021-06-27 ENCOUNTER — Telehealth: Payer: Self-pay | Admitting: Lactation Services

## 2021-06-27 NOTE — Telephone Encounter (Signed)
-----   Message from Venora Maples, MD sent at 06/26/2021  8:43 AM EST ----- ?Borderline elevated, downtrending appropriately ?Per review of chart patient passed what appeared to be POC's in MAU earlier this month but we have no pathology or [place] confirmation of IUP ?Clinical staff please coordinate follow up hcg in one week, trend to 0 ?

## 2021-06-27 NOTE — Telephone Encounter (Signed)
Called patient to let her know she needs a repeat Hcg this week. She did not answer. LM for her to call the office for results and recommendations. Asked patient to check her My Chart messages.  ? ?My Chart message sent.  ?

## 2021-06-29 ENCOUNTER — Other Ambulatory Visit: Payer: Self-pay

## 2021-06-29 ENCOUNTER — Other Ambulatory Visit: Payer: Medicaid Other

## 2021-06-29 DIAGNOSIS — O3680X0 Pregnancy with inconclusive fetal viability, not applicable or unspecified: Secondary | ICD-10-CM

## 2021-06-30 LAB — BETA HCG QUANT (REF LAB): hCG Quant: 2 m[IU]/mL

## 2021-07-06 ENCOUNTER — Ambulatory Visit (INDEPENDENT_AMBULATORY_CARE_PROVIDER_SITE_OTHER): Payer: Medicaid Other | Admitting: Obstetrics and Gynecology

## 2021-07-06 ENCOUNTER — Other Ambulatory Visit: Payer: Self-pay

## 2021-07-06 ENCOUNTER — Other Ambulatory Visit (HOSPITAL_COMMUNITY)
Admission: RE | Admit: 2021-07-06 | Discharge: 2021-07-06 | Disposition: A | Discharge status: Home / Self Care | Payer: Medicaid Other | Source: Ambulatory Visit | Attending: Obstetrics and Gynecology | Admitting: Obstetrics and Gynecology

## 2021-07-06 ENCOUNTER — Encounter: Payer: Self-pay | Admitting: Obstetrics and Gynecology

## 2021-07-06 VITALS — BP 125/78 | HR 54 | Wt 138.7 lb

## 2021-07-06 DIAGNOSIS — O039 Complete or unspecified spontaneous abortion without complication: Secondary | ICD-10-CM | POA: Diagnosis not present

## 2021-07-06 DIAGNOSIS — Z124 Encounter for screening for malignant neoplasm of cervix: Secondary | ICD-10-CM

## 2021-07-06 DIAGNOSIS — Z01419 Encounter for gynecological examination (general) (routine) without abnormal findings: Secondary | ICD-10-CM | POA: Insufficient documentation

## 2021-07-06 NOTE — Progress Notes (Signed)
?  CC: SAB follow up ?Subjective:  ? ? Patient ID: Linda Berry, female    DOB: [birth year], 26 y.o.   MRN: [id number] ? ?HPI ?26 yo G2P2 seen for SAB follow up form miscarriage 06/04/20.  SAB confirmed with ultrasound and bhcg levels decreasing to normal.  Discussed early miscarriages are likely chromosomal and are not usually repetitive.  Pt notes understanding. ? ? ?Review of Systems ? ?   ?Objective:  ? Physical Exam ?Vitals:  ? 07/06/21 1126  ?BP: 125/78  ?Pulse: (!) 54  ? ?SSE: pap taken without incident with chaperone present ? ? ?   ?Assessment & Plan:  ? ?1. Papanicolaou smear for cervical cancer screening ? ?- Cytology - PAP( [facility]) ? ?2. SAB (spontaneous abortion) ?Pt advised she can begin trying for pregnancy with onset of spontaneous menses or in 2-3 months.  Advised to start PNV while trying for pregnancy. ? ?3. Pap smear, as part of routine gynecological examination ? ? ?F/u prn ? ? ? ?Griffin Basil, MD ?Faculty Attending, [facility]  ?

## 2021-07-11 LAB — CYTOLOGY - PAP
Comment: NEGATIVE
Diagnosis: UNDETERMINED — AB
High risk HPV: NEGATIVE

## 2021-10-19 ENCOUNTER — Telehealth: Payer: Medicaid Other | Admitting: Physician Assistant

## 2021-10-19 DIAGNOSIS — J069 Acute upper respiratory infection, unspecified: Secondary | ICD-10-CM

## 2021-10-19 DIAGNOSIS — R221 Localized swelling, mass and lump, neck: Secondary | ICD-10-CM

## 2021-10-19 MED ORDER — LIDOCAINE VISCOUS HCL 2 % MT SOLN
OROMUCOSAL | 0 refills | Status: AC
Start: 2021-10-19 — End: ?

## 2021-10-19 MED ORDER — BENZONATATE 100 MG PO CAPS
100.0000 mg | ORAL_CAPSULE | Freq: Three times a day (TID) | ORAL | 0 refills | Status: AC | PRN
Start: 2021-10-19 — End: ?

## 2021-10-19 MED ORDER — PREDNISOLONE 15 MG/5ML PO SOLN: 30.0000 mg | Freq: Every day | ORAL | 0 refills | Status: AC

## 2021-10-19 MED ORDER — IPRATROPIUM BROMIDE 0.03 % NA SOLN: 2.0000 | Freq: Two times a day (BID) | NASAL | 0 refills | Status: AC

## 2021-10-19 NOTE — Progress Notes (Signed)

## 2021-11-20 ENCOUNTER — Telehealth: Payer: Medicaid Other | Admitting: Physician Assistant

## 2021-11-20 DIAGNOSIS — B3731 Acute candidiasis of vulva and vagina: Secondary | ICD-10-CM | POA: Diagnosis not present

## 2021-11-20 MED ORDER — FLUCONAZOLE 150 MG PO TABS: 150.0000 mg | ORAL_TABLET | Freq: Once | ORAL | 0 refills | Status: AC

## 2021-11-20 NOTE — Progress Notes (Signed)

## 2021-11-20 NOTE — Progress Notes (Signed)
I have spent 5 minutes in review of e-visit questionnaire, review and updating patient chart, medical decision making and response to patient.   Zacherie Honeyman Cody Hervey Wedig, PA-C    

## 2021-12-22 DIAGNOSIS — M542 Cervicalgia: Secondary | ICD-10-CM | POA: Diagnosis not present

## 2021-12-22 DIAGNOSIS — S43002A Unspecified subluxation of left shoulder joint, initial encounter: Secondary | ICD-10-CM | POA: Diagnosis not present

## 2021-12-22 DIAGNOSIS — M25519 Pain in unspecified shoulder: Secondary | ICD-10-CM | POA: Diagnosis not present

## 2021-12-22 DIAGNOSIS — M25512 Pain in left shoulder: Secondary | ICD-10-CM | POA: Diagnosis not present

## 2022-03-05 DIAGNOSIS — F331 Major depressive disorder, recurrent, moderate: Secondary | ICD-10-CM | POA: Diagnosis not present

## 2022-03-13 DIAGNOSIS — F439 Reaction to severe stress, unspecified: Secondary | ICD-10-CM | POA: Diagnosis not present

## 2022-03-13 DIAGNOSIS — F419 Anxiety disorder, unspecified: Secondary | ICD-10-CM | POA: Diagnosis not present

## 2022-03-13 DIAGNOSIS — F331 Major depressive disorder, recurrent, moderate: Secondary | ICD-10-CM | POA: Diagnosis not present

## 2022-03-18 DIAGNOSIS — G44021 Chronic cluster headache, intractable: Secondary | ICD-10-CM | POA: Diagnosis not present

## 2022-03-18 DIAGNOSIS — J45909 Unspecified asthma, uncomplicated: Secondary | ICD-10-CM | POA: Diagnosis not present

## 2022-03-18 DIAGNOSIS — F39 Unspecified mood [affective] disorder: Secondary | ICD-10-CM | POA: Diagnosis not present

## 2022-03-18 DIAGNOSIS — Z760 Encounter for issue of repeat prescription: Secondary | ICD-10-CM | POA: Diagnosis not present

## 2022-03-18 DIAGNOSIS — Z13220 Encounter for screening for lipoid disorders: Secondary | ICD-10-CM | POA: Diagnosis not present

## 2022-03-18 DIAGNOSIS — R7309 Other abnormal glucose: Secondary | ICD-10-CM | POA: Diagnosis not present

## 2022-03-18 DIAGNOSIS — Z79899 Other long term (current) drug therapy: Secondary | ICD-10-CM | POA: Diagnosis not present

## 2022-03-18 DIAGNOSIS — R5383 Other fatigue: Secondary | ICD-10-CM | POA: Diagnosis not present

## 2022-04-03 DIAGNOSIS — F331 Major depressive disorder, recurrent, moderate: Secondary | ICD-10-CM | POA: Diagnosis not present

## 2022-04-03 DIAGNOSIS — F419 Anxiety disorder, unspecified: Secondary | ICD-10-CM | POA: Diagnosis not present

## 2022-04-03 DIAGNOSIS — F439 Reaction to severe stress, unspecified: Secondary | ICD-10-CM | POA: Diagnosis not present

## 2022-05-06 DIAGNOSIS — F331 Major depressive disorder, recurrent, moderate: Secondary | ICD-10-CM | POA: Diagnosis not present

## 2022-05-06 DIAGNOSIS — F439 Reaction to severe stress, unspecified: Secondary | ICD-10-CM | POA: Diagnosis not present

## 2022-05-06 DIAGNOSIS — F419 Anxiety disorder, unspecified: Secondary | ICD-10-CM | POA: Diagnosis not present

## 2022-06-03 DIAGNOSIS — F3181 Bipolar II disorder: Secondary | ICD-10-CM | POA: Diagnosis not present

## 2022-06-04 DIAGNOSIS — F3181 Bipolar II disorder: Secondary | ICD-10-CM | POA: Diagnosis not present

## 2022-06-17 DIAGNOSIS — F3181 Bipolar II disorder: Secondary | ICD-10-CM | POA: Diagnosis not present

## 2022-06-24 DIAGNOSIS — J45901 Unspecified asthma with (acute) exacerbation: Secondary | ICD-10-CM | POA: Diagnosis not present

## 2022-06-25 DIAGNOSIS — F3181 Bipolar II disorder: Secondary | ICD-10-CM | POA: Diagnosis not present

## 2022-06-27 DIAGNOSIS — F3181 Bipolar II disorder: Secondary | ICD-10-CM | POA: Diagnosis not present

## 2022-07-08 DIAGNOSIS — Z8669 Personal history of other diseases of the nervous system and sense organs: Secondary | ICD-10-CM | POA: Diagnosis not present

## 2022-07-11 DIAGNOSIS — F3181 Bipolar II disorder: Secondary | ICD-10-CM | POA: Diagnosis not present

## 2022-07-22 DIAGNOSIS — F3181 Bipolar II disorder: Secondary | ICD-10-CM | POA: Diagnosis not present

## 2022-07-30 DIAGNOSIS — F3181 Bipolar II disorder: Secondary | ICD-10-CM | POA: Diagnosis not present

## 2022-07-30 DIAGNOSIS — Z3201 Encounter for pregnancy test, result positive: Secondary | ICD-10-CM | POA: Diagnosis not present

## 2022-07-30 DIAGNOSIS — Z3A01 Less than 8 weeks gestation of pregnancy: Secondary | ICD-10-CM | POA: Diagnosis not present

## 2022-07-30 DIAGNOSIS — O218 Other vomiting complicating pregnancy: Secondary | ICD-10-CM | POA: Diagnosis not present

## 2022-07-30 DIAGNOSIS — O99341 Other mental disorders complicating pregnancy, first trimester: Secondary | ICD-10-CM | POA: Diagnosis not present

## 2022-08-08 DIAGNOSIS — O3481 Maternal care for other abnormalities of pelvic organs, first trimester: Secondary | ICD-10-CM | POA: Diagnosis not present

## 2022-08-08 DIAGNOSIS — Z3201 Encounter for pregnancy test, result positive: Secondary | ICD-10-CM | POA: Diagnosis not present

## 2022-08-08 DIAGNOSIS — N8312 Corpus luteum cyst of left ovary: Secondary | ICD-10-CM | POA: Diagnosis not present

## 2022-08-08 DIAGNOSIS — Z3A01 Less than 8 weeks gestation of pregnancy: Secondary | ICD-10-CM | POA: Diagnosis not present

## 2022-08-19 DIAGNOSIS — G44021 Chronic cluster headache, intractable: Secondary | ICD-10-CM | POA: Diagnosis not present

## 2022-08-19 DIAGNOSIS — S143XXS Injury of brachial plexus, sequela: Secondary | ICD-10-CM | POA: Diagnosis not present

## 2022-08-23 DIAGNOSIS — O09891 Supervision of other high risk pregnancies, first trimester: Secondary | ICD-10-CM | POA: Diagnosis not present

## 2022-08-23 DIAGNOSIS — Z3A09 9 weeks gestation of pregnancy: Secondary | ICD-10-CM | POA: Diagnosis not present

## 2022-08-23 DIAGNOSIS — O09211 Supervision of pregnancy with history of pre-term labor, first trimester: Secondary | ICD-10-CM | POA: Diagnosis not present

## 2022-08-23 DIAGNOSIS — Z8669 Personal history of other diseases of the nervous system and sense organs: Secondary | ICD-10-CM | POA: Diagnosis not present

## 2022-08-23 DIAGNOSIS — O99341 Other mental disorders complicating pregnancy, first trimester: Secondary | ICD-10-CM | POA: Diagnosis not present

## 2022-08-23 DIAGNOSIS — O09291 Supervision of pregnancy with other poor reproductive or obstetric history, first trimester: Secondary | ICD-10-CM | POA: Diagnosis not present

## 2022-08-23 DIAGNOSIS — F3181 Bipolar II disorder: Secondary | ICD-10-CM | POA: Diagnosis not present

## 2022-08-26 DIAGNOSIS — O99341 Other mental disorders complicating pregnancy, first trimester: Secondary | ICD-10-CM | POA: Diagnosis not present

## 2022-08-26 DIAGNOSIS — F3181 Bipolar II disorder: Secondary | ICD-10-CM | POA: Diagnosis not present

## 2022-08-26 DIAGNOSIS — Z3A10 10 weeks gestation of pregnancy: Secondary | ICD-10-CM | POA: Diagnosis not present

## 2022-09-03 DIAGNOSIS — F3181 Bipolar II disorder: Secondary | ICD-10-CM | POA: Diagnosis not present

## 2022-09-05 DIAGNOSIS — F3181 Bipolar II disorder: Secondary | ICD-10-CM | POA: Diagnosis not present

## 2022-09-11 DIAGNOSIS — Z3A12 12 weeks gestation of pregnancy: Secondary | ICD-10-CM | POA: Diagnosis not present

## 2022-09-11 DIAGNOSIS — Z3682 Encounter for antenatal screening for nuchal translucency: Secondary | ICD-10-CM | POA: Diagnosis not present

## 2022-09-11 DIAGNOSIS — Z36 Encounter for antenatal screening for chromosomal anomalies: Secondary | ICD-10-CM | POA: Diagnosis not present

## 2022-09-11 DIAGNOSIS — Z363 Encounter for antenatal screening for malformations: Secondary | ICD-10-CM | POA: Diagnosis not present

## 2022-09-11 DIAGNOSIS — Z3481 Encounter for supervision of other normal pregnancy, first trimester: Secondary | ICD-10-CM | POA: Diagnosis not present

## 2022-09-26 DIAGNOSIS — F3181 Bipolar II disorder: Secondary | ICD-10-CM | POA: Diagnosis not present

## 2022-10-24 DIAGNOSIS — Z363 Encounter for antenatal screening for malformations: Secondary | ICD-10-CM | POA: Diagnosis not present

## 2022-10-24 DIAGNOSIS — Z3A18 18 weeks gestation of pregnancy: Secondary | ICD-10-CM | POA: Diagnosis not present

## 2022-12-31 DIAGNOSIS — Z23 Encounter for immunization: Secondary | ICD-10-CM | POA: Diagnosis not present

## 2023-01-14 DIAGNOSIS — Z23 Encounter for immunization: Secondary | ICD-10-CM | POA: Diagnosis not present

## 2023-01-20 DIAGNOSIS — J029 Acute pharyngitis, unspecified: Secondary | ICD-10-CM | POA: Diagnosis not present

## 2023-01-20 DIAGNOSIS — J069 Acute upper respiratory infection, unspecified: Secondary | ICD-10-CM | POA: Diagnosis not present

## 2023-01-20 DIAGNOSIS — R0981 Nasal congestion: Secondary | ICD-10-CM | POA: Diagnosis not present

## 2023-01-20 DIAGNOSIS — Z1152 Encounter for screening for COVID-19: Secondary | ICD-10-CM | POA: Diagnosis not present

## 2023-01-27 DIAGNOSIS — Z3A32 32 weeks gestation of pregnancy: Secondary | ICD-10-CM | POA: Diagnosis not present

## 2023-01-27 DIAGNOSIS — O09293 Supervision of pregnancy with other poor reproductive or obstetric history, third trimester: Secondary | ICD-10-CM | POA: Diagnosis not present

## 2023-02-19 DIAGNOSIS — Z8669 Personal history of other diseases of the nervous system and sense organs: Secondary | ICD-10-CM | POA: Diagnosis not present

## 2023-02-19 DIAGNOSIS — Z3A35 35 weeks gestation of pregnancy: Secondary | ICD-10-CM | POA: Diagnosis not present

## 2023-02-19 DIAGNOSIS — Z8759 Personal history of other complications of pregnancy, childbirth and the puerperium: Secondary | ICD-10-CM | POA: Diagnosis not present

## 2023-03-08 DIAGNOSIS — O479 False labor, unspecified: Secondary | ICD-10-CM | POA: Diagnosis not present

## 2023-03-08 DIAGNOSIS — O471 False labor at or after 37 completed weeks of gestation: Secondary | ICD-10-CM | POA: Diagnosis not present

## 2023-03-08 DIAGNOSIS — Z3A37 37 weeks gestation of pregnancy: Secondary | ICD-10-CM | POA: Diagnosis not present

## 2023-03-10 NOTE — Telephone Encounter (Signed)
Open in Error.

## 2023-03-14 DIAGNOSIS — Z3A38 38 weeks gestation of pregnancy: Secondary | ICD-10-CM | POA: Diagnosis not present

## 2023-03-14 DIAGNOSIS — O471 False labor at or after 37 completed weeks of gestation: Secondary | ICD-10-CM | POA: Diagnosis not present

## 2023-03-17 DIAGNOSIS — F3181 Bipolar II disorder: Secondary | ICD-10-CM | POA: Diagnosis not present

## 2023-03-17 DIAGNOSIS — Z888 Allergy status to other drugs, medicaments and biological substances status: Secondary | ICD-10-CM | POA: Diagnosis not present

## 2023-03-17 DIAGNOSIS — O471 False labor at or after 37 completed weeks of gestation: Secondary | ICD-10-CM | POA: Diagnosis not present

## 2023-03-17 DIAGNOSIS — Z9889 Other specified postprocedural states: Secondary | ICD-10-CM | POA: Diagnosis not present

## 2023-03-17 DIAGNOSIS — O99344 Other mental disorders complicating childbirth: Secondary | ICD-10-CM | POA: Diagnosis not present

## 2023-03-17 DIAGNOSIS — Z79899 Other long term (current) drug therapy: Secondary | ICD-10-CM | POA: Diagnosis not present

## 2023-03-17 DIAGNOSIS — Z7982 Long term (current) use of aspirin: Secondary | ICD-10-CM | POA: Diagnosis not present

## 2023-03-17 DIAGNOSIS — Z3A39 39 weeks gestation of pregnancy: Secondary | ICD-10-CM | POA: Diagnosis not present

## 2023-03-17 DIAGNOSIS — O9952 Diseases of the respiratory system complicating childbirth: Secondary | ICD-10-CM | POA: Diagnosis not present

## 2023-03-17 DIAGNOSIS — J45909 Unspecified asthma, uncomplicated: Secondary | ICD-10-CM | POA: Diagnosis not present

## 2023-03-17 DIAGNOSIS — Z833 Family history of diabetes mellitus: Secondary | ICD-10-CM | POA: Diagnosis not present

## 2023-03-17 DIAGNOSIS — Z8249 Family history of ischemic heart disease and other diseases of the circulatory system: Secondary | ICD-10-CM | POA: Diagnosis not present

## 2023-03-17 DIAGNOSIS — O99214 Obesity complicating childbirth: Secondary | ICD-10-CM | POA: Diagnosis not present

## 2023-05-01 DIAGNOSIS — Z3009 Encounter for other general counseling and advice on contraception: Secondary | ICD-10-CM | POA: Diagnosis not present

## 2023-05-01 DIAGNOSIS — Z1389 Encounter for screening for other disorder: Secondary | ICD-10-CM | POA: Diagnosis not present

## 2023-05-01 DIAGNOSIS — Z1332 Encounter for screening for maternal depression: Secondary | ICD-10-CM | POA: Diagnosis not present

## 2023-06-25 DIAGNOSIS — F3181 Bipolar II disorder: Secondary | ICD-10-CM | POA: Diagnosis not present

## 2023-06-27 DIAGNOSIS — M533 Sacrococcygeal disorders, not elsewhere classified: Secondary | ICD-10-CM | POA: Diagnosis not present

## 2023-07-02 DIAGNOSIS — F3181 Bipolar II disorder: Secondary | ICD-10-CM | POA: Diagnosis not present

## 2023-07-09 DIAGNOSIS — F3181 Bipolar II disorder: Secondary | ICD-10-CM | POA: Diagnosis not present

## 2023-07-24 DIAGNOSIS — F3181 Bipolar II disorder: Secondary | ICD-10-CM | POA: Diagnosis not present

## 2023-08-21 DIAGNOSIS — F3181 Bipolar II disorder: Secondary | ICD-10-CM | POA: Diagnosis not present

## 2023-09-01 DIAGNOSIS — Z13220 Encounter for screening for lipoid disorders: Secondary | ICD-10-CM | POA: Diagnosis not present

## 2023-09-01 DIAGNOSIS — Z131 Encounter for screening for diabetes mellitus: Secondary | ICD-10-CM | POA: Diagnosis not present

## 2023-09-01 DIAGNOSIS — Z130 Encounter for screening for diseases of the blood and blood-forming organs and certain disorders involving the immune mechanism: Secondary | ICD-10-CM | POA: Diagnosis not present

## 2023-09-01 DIAGNOSIS — Z01419 Encounter for gynecological examination (general) (routine) without abnormal findings: Secondary | ICD-10-CM | POA: Diagnosis not present

## 2023-09-01 DIAGNOSIS — Z1329 Encounter for screening for other suspected endocrine disorder: Secondary | ICD-10-CM | POA: Diagnosis not present

## 2023-09-01 DIAGNOSIS — Z1321 Encounter for screening for nutritional disorder: Secondary | ICD-10-CM | POA: Diagnosis not present

## 2023-09-04 DIAGNOSIS — F3181 Bipolar II disorder: Secondary | ICD-10-CM | POA: Diagnosis not present

## 2023-09-29 DIAGNOSIS — F3181 Bipolar II disorder: Secondary | ICD-10-CM | POA: Diagnosis not present

## 2023-10-06 DIAGNOSIS — F3181 Bipolar II disorder: Secondary | ICD-10-CM | POA: Diagnosis not present

## 2023-11-18 DIAGNOSIS — F3181 Bipolar II disorder: Secondary | ICD-10-CM | POA: Diagnosis not present

## 2023-11-20 DIAGNOSIS — F3181 Bipolar II disorder: Secondary | ICD-10-CM | POA: Diagnosis not present

## 2023-12-08 DIAGNOSIS — F3181 Bipolar II disorder: Secondary | ICD-10-CM | POA: Diagnosis not present

## 2024-01-02 DIAGNOSIS — F3181 Bipolar II disorder: Secondary | ICD-10-CM | POA: Diagnosis not present

## 2024-01-09 DIAGNOSIS — F3181 Bipolar II disorder: Secondary | ICD-10-CM | POA: Diagnosis not present

## 2024-01-13 DIAGNOSIS — F3181 Bipolar II disorder: Secondary | ICD-10-CM | POA: Diagnosis not present

## 2024-02-03 DIAGNOSIS — F3181 Bipolar II disorder: Secondary | ICD-10-CM | POA: Diagnosis not present

## 2024-02-04 DIAGNOSIS — S61011A Laceration without foreign body of right thumb without damage to nail, initial encounter: Secondary | ICD-10-CM | POA: Diagnosis not present

## 2024-02-04 DIAGNOSIS — F3181 Bipolar II disorder: Secondary | ICD-10-CM | POA: Diagnosis not present
